# Patient Record
Sex: Male | Born: 1945
Health system: Southern US, Community
[De-identification: ages and names within clinical notes are randomized; demographics above are authoritative.]

## PROBLEM LIST (undated history)

## (undated) DIAGNOSIS — I63132 Cerebral infarction due to embolism of left carotid artery: Secondary | ICD-10-CM

## (undated) DIAGNOSIS — I493 Ventricular premature depolarization: Secondary | ICD-10-CM

## (undated) DIAGNOSIS — F419 Anxiety disorder, unspecified: Secondary | ICD-10-CM

## (undated) DIAGNOSIS — R918 Other nonspecific abnormal finding of lung field: Secondary | ICD-10-CM

## (undated) DIAGNOSIS — R000 Tachycardia, unspecified: Secondary | ICD-10-CM

## (undated) DIAGNOSIS — F1090 Alcohol use, unspecified, uncomplicated: Secondary | ICD-10-CM

## (undated) DIAGNOSIS — F32A Depression, unspecified: Secondary | ICD-10-CM

## (undated) DIAGNOSIS — Z789 Other specified health status: Secondary | ICD-10-CM

## (undated) DIAGNOSIS — I4719 Other supraventricular tachycardia: Secondary | ICD-10-CM

## (undated) DIAGNOSIS — I313 Pericardial effusion (noninflammatory): Secondary | ICD-10-CM

## (undated) DIAGNOSIS — I2510 Atherosclerotic heart disease of native coronary artery without angina pectoris: Secondary | ICD-10-CM

## (undated) DIAGNOSIS — F329 Major depressive disorder, single episode, unspecified: Secondary | ICD-10-CM

## (undated) DIAGNOSIS — I3139 Other pericardial effusion (noninflammatory): Secondary | ICD-10-CM

## (undated) DIAGNOSIS — I471 Supraventricular tachycardia: Secondary | ICD-10-CM

## (undated) DIAGNOSIS — Z7289 Other problems related to lifestyle: Secondary | ICD-10-CM

## (undated) DIAGNOSIS — E785 Hyperlipidemia, unspecified: Secondary | ICD-10-CM

## (undated) DIAGNOSIS — I472 Ventricular tachycardia: Secondary | ICD-10-CM

## (undated) DIAGNOSIS — I351 Nonrheumatic aortic (valve) insufficiency: Secondary | ICD-10-CM

## (undated) DIAGNOSIS — I639 Cerebral infarction, unspecified: Secondary | ICD-10-CM

## (undated) DIAGNOSIS — N289 Disorder of kidney and ureter, unspecified: Secondary | ICD-10-CM

## (undated) DIAGNOSIS — E039 Hypothyroidism, unspecified: Secondary | ICD-10-CM

## (undated) DIAGNOSIS — I10 Essential (primary) hypertension: Secondary | ICD-10-CM

## (undated) HISTORY — DX: Cerebral infarction due to embolism of left carotid artery: I63.132

## (undated) HISTORY — DX: Atherosclerotic heart disease of native coronary artery without angina pectoris: I25.10

## (undated) HISTORY — DX: Hyperlipidemia, unspecified: E78.5

## (undated) HISTORY — DX: Essential (primary) hypertension: I10

## (undated) HISTORY — DX: Anxiety disorder, unspecified: F41.9

## (undated) HISTORY — PX: EYE SURGERY: SHX253

## (undated) HISTORY — DX: Cerebral infarction, unspecified: I63.9

## (undated) HISTORY — DX: Hypothyroidism, unspecified: E03.9

## (undated) HISTORY — DX: Other nonspecific abnormal finding of lung field: R91.8

---

## 1967-08-24 HISTORY — PX: MANDIBLE SURGERY: SHX707

## 1977-08-23 HISTORY — PX: BREAST LUMPECTOMY: SHX2

## 1978-08-23 HISTORY — PX: CIRCUMCISION: SUR203

## 1994-08-23 HISTORY — PX: BACK SURGERY: SHX140

## 2004-01-22 ENCOUNTER — Encounter: Admission: RE | Admit: 2004-01-22 | Discharge: 2004-01-22 | Payer: Self-pay | Admitting: Internal Medicine

## 2004-02-06 ENCOUNTER — Encounter: Admission: RE | Admit: 2004-02-06 | Discharge: 2004-02-06 | Payer: Self-pay | Admitting: Internal Medicine

## 2004-02-21 ENCOUNTER — Encounter: Admission: RE | Admit: 2004-02-21 | Discharge: 2004-02-21 | Payer: Self-pay | Admitting: Internal Medicine

## 2004-06-12 ENCOUNTER — Ambulatory Visit (HOSPITAL_COMMUNITY): Admission: RE | Admit: 2004-06-12 | Discharge: 2004-06-12 | Payer: Self-pay | Admitting: Cardiology

## 2005-12-21 ENCOUNTER — Ambulatory Visit: Payer: Self-pay | Admitting: Internal Medicine

## 2006-01-03 ENCOUNTER — Ambulatory Visit (HOSPITAL_COMMUNITY): Admission: RE | Admit: 2006-01-03 | Discharge: 2006-01-03 | Payer: Self-pay | Admitting: Internal Medicine

## 2006-01-06 ENCOUNTER — Ambulatory Visit: Payer: Self-pay | Admitting: Internal Medicine

## 2006-07-01 ENCOUNTER — Ambulatory Visit (HOSPITAL_COMMUNITY): Admission: RE | Admit: 2006-07-01 | Discharge: 2006-07-01 | Payer: Self-pay | Admitting: Urology

## 2006-07-04 ENCOUNTER — Ambulatory Visit: Payer: Self-pay | Admitting: Internal Medicine

## 2007-01-31 ENCOUNTER — Ambulatory Visit (HOSPITAL_COMMUNITY): Admission: RE | Admit: 2007-01-31 | Discharge: 2007-01-31 | Payer: Self-pay | Admitting: Internal Medicine

## 2007-11-22 ENCOUNTER — Ambulatory Visit: Payer: Self-pay | Admitting: Cardiology

## 2007-11-23 ENCOUNTER — Inpatient Hospital Stay (HOSPITAL_COMMUNITY): Admission: AD | Admit: 2007-11-23 | Discharge: 2007-11-25 | Payer: Self-pay | Admitting: Internal Medicine

## 2007-11-23 ENCOUNTER — Ambulatory Visit: Payer: Self-pay | Admitting: Cardiology

## 2007-12-18 ENCOUNTER — Ambulatory Visit: Payer: Self-pay | Admitting: Cardiology

## 2008-08-30 ENCOUNTER — Inpatient Hospital Stay (HOSPITAL_COMMUNITY): Admission: AD | Admit: 2008-08-30 | Discharge: 2008-08-31 | Payer: Self-pay | Admitting: Cardiology

## 2008-08-30 ENCOUNTER — Ambulatory Visit: Payer: Self-pay | Admitting: Cardiovascular Disease

## 2008-08-30 ENCOUNTER — Ambulatory Visit: Payer: Self-pay | Admitting: Cardiology

## 2008-09-16 ENCOUNTER — Ambulatory Visit: Payer: Self-pay | Admitting: Cardiology

## 2009-01-09 ENCOUNTER — Encounter: Payer: Self-pay | Admitting: Cardiology

## 2009-05-22 DIAGNOSIS — R079 Chest pain, unspecified: Secondary | ICD-10-CM

## 2009-05-22 DIAGNOSIS — E785 Hyperlipidemia, unspecified: Secondary | ICD-10-CM | POA: Insufficient documentation

## 2009-05-22 DIAGNOSIS — E782 Mixed hyperlipidemia: Secondary | ICD-10-CM | POA: Insufficient documentation

## 2009-05-22 DIAGNOSIS — I2510 Atherosclerotic heart disease of native coronary artery without angina pectoris: Secondary | ICD-10-CM | POA: Insufficient documentation

## 2009-05-22 DIAGNOSIS — I10 Essential (primary) hypertension: Secondary | ICD-10-CM | POA: Insufficient documentation

## 2010-12-07 LAB — BASIC METABOLIC PANEL
Calcium: 8.8 mg/dL (ref 8.4–10.5)
Chloride: 106 mEq/L (ref 96–112)
Creatinine, Ser: 1 mg/dL (ref 0.4–1.5)
GFR calc Af Amer: >60 mL/min (ref >60)
Glucose, Bld: 100 mg/dL — ABNORMAL HIGH (ref 70–99)
Potassium: 3.9 mEq/L (ref 3.5–5.1)

## 2010-12-07 LAB — GLUCOSE, CAPILLARY

## 2010-12-07 LAB — AMYLASE: Amylase: 32 U/L (ref 27–131)

## 2010-12-07 LAB — CBC
HCT: 41.7 % (ref 39.0–52.0)
Hemoglobin: 13.8 g/dL (ref 13.0–17.0)
MCHC: 33 g/dL (ref 30.0–36.0)
RDW: 13.6 % (ref 11.5–15.5)

## 2010-12-07 LAB — LIPASE, BLOOD: Lipase: 25 U/L (ref 11–59)

## 2010-12-07 LAB — LIPID PANEL: Triglycerides: 175 mg/dL — ABNORMAL HIGH (ref <150)

## 2011-01-05 NOTE — Discharge Summary (Signed)
NAMEROBBI, SCURLOCK NO.:  [id number]   MEDICAL RECORD NO.:  [id number]          PATIENT TYPE:  INP   LOCATION:  6526                         FACILITY:  [facility]   PHYSICIAN:  Jesse Sans. Wall, MD, FACCDATE OF BIRTH:  10/25/1945   DATE OF ADMISSION:  08/30/2008  DATE OF DISCHARGE:  08/31/2008                               DISCHARGE SUMMARY   PROCEDURES:  1. Cardiac catheterization.  2. Coronary arteriogram.  3. Left ventriculogram.  4. Aortic arch angiogram.   PRIMARY FINAL DISCHARGE DIAGNOSIS:  Chest pain, no critical coronary  artery disease at catheterization.   SECONDARY DIAGNOSES:  1. Preserved left ventricular function with an ejection fraction of      55%.  2. History of lumbosacral degenerative joint disease with chronic back      pain.  3. Hypothyroidism.  4. Diabetes.  5. Seasonal allergies.  6. Hypertension.  7. History of pulmonary nodule in the left lung as well as a small      aneurysmal area, followup chest CT recommended.  8. Kidneys stones.  9. Hyperlipidemia.  10.Back surgery in 1996.  11.Abdominal evaluation with ultrasound, HIDA scan and EGD which were      all normal.  12.History of colonoscopy in 2005 with a tortuous colon.  13.Remote history of pancreatitis secondary to EtOH use.  14.Remote history of tobacco use.   TIME OF DISCHARGE:  33 minutes.   HOSPITAL COURSE:  Mr. Gomer is a 65 year old male with a history of  nonobstructive coronary artery disease who had chest pain and was  referred for further evaluation.  He has multiple cardiac risk factors  and his symptoms were concerning, so he was sent to the hospital for  catheterization.   The cardiac catheterization showed 30% proximal and mid LAD with a 20%  mid and distal RCA stenosis.  His EF was normal.  He was admitted  overnight for observation.   On August 31, 2008, his vital signs were well controlled.  Because a  small aneurysm was seen in the past, it was  recommended that once his  renal function had been rechecked post cath, he get a CT angiogram of  the chest to reassess this if it has not been recently done.  He also  has a pulmonary nodule that would need followup.  Dr. Daleen Squibb evaluated Mr.  Speros on August 31, 2008, and felt him stable for discharge with  close outpatient followup.   DISCHARGE INSTRUCTIONS:  His activity level is to be increased gradually  with no lifting for a week and no driving for 2 days.  He is to call our  office for problems with the cath site.  He is to follow up with Dr.  Andee Lineman in our office, we will call him.  He is to follow up with Dr.  Sherril Croon as needed.   DISCHARGE MEDICATIONS:  1. Metoprolol 50 mg b.i.d.  2. Lipitor 80 mg a day.  3. Synthroid 50 mcg daily.  4. Neurontin 100 mg t.i.d.  5. Hydrocodone 10 mg p.r.n.  6. Glyburide 2.5 mg daily.  7. Aspirin 81 mg daily.  Theodore Demark, PA-C      Jesse Sans. Daleen Squibb, MD, [facility]  Electronically Signed    RB/MEDQ  D:  08/31/2008  T:  08/31/2008  Job:  [id number]   cc:   Doreen Beam, MD

## 2011-01-05 NOTE — Assessment & Plan Note (Signed)
[facility]                          [facility]ICE NOTE   AYRON, FILLINGER                  MRN:          [id number]  DATE:09/16/2008                            DOB:          1946-08-23    HISTORY OF PRESENT ILLNESS:  The patient is a very pleasant 65 year old  male with a history of hypertension.  The patient underwent a recent  cardiac catheterization by Dr. Excell Seltzer.  She presented with classic chest  pain.  Catheterization was actually within normal limits.  Ejection  fraction was 55%.  He has no mitral regurgitation.  Aortic arch  angiography demonstrated a bovine arch, but no visualization of an  aortic arch aneurysm by arch angiography.  There was no evidence of  dilatation or dissection.  Next, the patient states that he has no  substernal chest pain, no shortness of breath, orthopnea, or PND.  No  palpitations or syncope.   MEDICATIONS:  1. Synthroid 50 mcg p.o. daily.  2. Lopressor 50 mg p.o. b.i.d.  3. Hydrocodone 10/650 t.i.d.  4. Zocor 40 mg p.o. nightly.  5. Aspirin 325 daily.  6. Protonix 40 mg p.o. daily.  7. Glyburide 2.5 mg p.o. daily.  8. Neurontin 100 mg p.o. t.i.d.  9. Hydrochlorothiazide 25 mg.   PHYSICAL EXAMINATION:  VITAL SIGNS:  Blood pressure is 140/86, heart  rate 71, weight 189 pounds.  NECK:  Normal carotid upstroke and no carotid bruits.  LUNGS:  Breath sounds bilaterally.  HEART:  Regular rate and rhythm.  Normal S1, S2.  No murmur, rubs, or  gallops.  ABDOMEN:  Soft, nontender.  No rebound or guarding.  Good bowel sounds.  EXTREMITIES:  No cyanosis, clubbing, or edema.  NEUROLOGIC:  The patient is alert, oriented, and grossly nonfocal.   PROBLEM LIST:  1. Chest pain with nonobstructive coronary artery disease.  2. Normal ejection fraction.  3. Hypertension, poorly controlled.  4. Hyperlipidemia.  5. Remote tobacco use.  6. Type 2 diabetes mellitus.  7. Bovine aortic arch.  8. Resolved  pulmonary nodules.  9. Hypothyroidism.  Followed by his primary care physician.   PLAN:  1. I have reassured the patient that he does not need yearly CT scans      for an aneurysm as it turns out that he has a bovine arch.  2. Blood pressure elevation that needs to be controlled, and I will      add hydrochlorothiazide 25 mg to his medical regimen.     Learta Codding, MD,FACC  Electronically Signed    GED/MedQ  DD: 09/16/2008  DT: 09/17/2008  Job #: [id number]   cc:   Doreen Beam, MD

## 2011-01-05 NOTE — Assessment & Plan Note (Signed)
[facility]                          [facility]ICE NOTE   YEHYA, BRENDLE                  MRN:          [id number]  DATE:12/18/2007                            DOB:          08/17/1946    PRIMARY CARDIOLOGIST:  Dr. Lewayne Bunting.   REASON FOR VISIT:  Post hospital follow-up.   The patient presents to the clinic after being transferred directly from  [facility] to [facility], for further evaluation of chest pain.  He presented with prior history of nonobstructive CAD by previous  catheterization in 2005, by Dr. Randa Evens.  He did not return for  follow-up.   His current cardiac catheterization, performed by Dr. Simona Huh,  again showed nonobstructive CAD with approximately 50% mid-LAD  associated with an element of bridging, but no clear obstruction.  LV  function was preserved (EF 60%), with no regional wall motion  abnormality.  There was no significant valvular disease.   Medications were adjusted with the addition of simvastatin 40 daily.  The patient was on also advised to follow-up with Dr. Sherril Croon, with respect  to an elevated TSH of 10.2.   Prior to transfer to [facility], the patient did have a CT scan of the chest  here, giving documented history of right lung nodules as well as an  ulcerated aortic arch.  The CT scan suggested a stable, small (10 x 30  mm) aneurysm of the aortic arch with thrombus.  There was also interval  resolution of small, bilateral lung nodules.   The patient reports no complications of the right groin incision site.   Mr. Grell has had occasional chest pain since his recent  hospitalization, but these are clearly nonexertional and atypical.  He  denies any exertional component.  He also has a reported history of  gastritis, and suggested himself that he had had a previous ulcer.  He was placed on Protonix at [facility].   CURRENT MEDICATIONS:  1. Synthroid 0.05 mg daily.  2.  Lopressor 50 b.i.d.  3. Hydrocodone 10/650 t.i.d.  4. Zocor 40 daily.  5. Full-dose aspirin.  6. Protonix.  7. Fexofenadine 180 daily.  8. Glyburide and metformin.   PHYSICAL EXAMINATION:  Blood pressure 154/87, pulse 74, regular weight  189.  GENERAL:  A 65 year old male, sitting upright, no distress.  HEENT:  Normocephalic, atraumatic.  NECK:  Palpable carotid pulses without bruits; no JVD.  LUNGS:  Clear to auscultation in all fields.  HEART:  Regular rate and rhythm (S1/S); no murmurs, rubs, or gallops.  ABDOMEN:  Soft, nontender, intact bowel sounds.  EXTREMITIES:  Right groin stable with no ecchymosis, hematoma, or bruit  on auscultation; palpable distal pulses without edema.  NEUROLOGICAL:  No focal deficits.   IMPRESSION:  1. Atypical chest pain.      a.     Nonobstructive coronary artery disease by recent cardiac       catheterization.      b.     50% mid LAD, with element of bridging.      c.     Ejection fraction 60%; no wall motion abnormalities.  2. Hypertension, uncontrolled.  3. Mixed dyslipidemia.      a.     Simvastatin recently added.  4. Remote tobacco.  5. Type 2 diabetes mellitus.  6. History of gastritis.  7. Chronic, small aortic arch aneurysm with thrombus.      a.     Stable by current CT scan.  8. History of pulmonary nodules.      a.     Resolved by current CT scan.  9. Hyperthyroidism.      a.     Elevated TSH level.   PLAN:  1. Adjust current medication regimen as follows:  downtitrate beta-      blocker, as this is not a good first line agent for treatment of      hypertension.  The patient has no history of myocardial infarction,      no evidence of LV dysfunction, or history of congestive heart      failure.  However, given the finding of the small, chronic aortic      arch aneurysm, I will keep Mr. Craine on a minimal dose of 25 mg      b.i.d..  Better suited for his uncontrolled blood pressure, we will      initiate treatment with  hydrochlorothiazide 12.5 daily and      lisinopril 5 daily for aggressive management.  These can then be      uptitrated as blood pressure allows.  2. Follow-up BMET in 1 week.  We will check a followup TSH at that      time, as well.  If this remains elevated, then we will refer this      result to Dr. Sherril Croon, for continued management of hypothyroidism.  3. Down titrate aspirin to 81 mg daily, indefinitely.  4. Surveillance fasting lipid profile in 2 months.  5. Schedule return clinic follow-up with myself and Dr. Andee Lineman in 2      months.      Gene Serpe, PA-C  Electronically Signed      Learta Codding, MD,FACC  Electronically Signed   GS/MedQ  DD: 12/18/2007  DT: 12/18/2007  Job #: [id number]   cc:   Doreen Beam, MD

## 2011-01-05 NOTE — Discharge Summary (Signed)
[name], [name] NO.:  [id number]   MEDICAL RECORD NO.:  [id number]          PATIENT TYPE:  INP   LOCATION:  4707                         FACILITY:  [facility]   PHYSICIAN:  Madolyn Frieze. Jens Som, MD, FACCDATE OF BIRTH:  Oct 26, 1945   DATE OF ADMISSION:  11/23/2007  DATE OF DISCHARGE:                         DISCHARGE SUMMARY - REFERRING   DISCHARGE DIAGNOSES:  1. Chest discomfort of uncertain etiology.  2. Nonobstructive coronary artery disease with preserved left      ventricular function.  3. Small aortic arch aneurysm.  4. Hyperlipidemia.  5. History of hypothyroidism with persistent elevated thyroid-      stimulating hormone.  6. History of diabetes.  7. Hypertension.   ADMITTING PHYSICIAN:  Dr. Learta Codding  on November 23, 2007.   DISCHARGING PHYSICIAN:  Dr. Jens Som on November 25, 2007.   BRIEF HISTORY:  [name] is a 65 year old male patient who was seen  in consultation on November 23, 2007, at [facility] by Dr. Andee Lineman.  The patient described a 2-3 week history of exertional chest discomfort  associated with shortness of breath.  He had not experienced any rest  symptoms until the day prior to admission while sitting in a car,  associated with radiation to his left shoulder, near syncope.  He went  to his primary care physician's office who then had him admitted to [facility].   PAST MEDICAL HISTORY:  1. Notable for nonobstructive coronary artery disease by cath in      October 2005.  2. Diabetes.  3. Hypertension.  4. Hypothyroidism.  5. Kidney disorder as a child.  6. Alcoholic pancreatitis.  7. DJD.  8. Bilateral lower lobe pulmonary nodules.  9. SULFA ALLERGY.  10.Remote tobacco.   LABORATORY DATA:  At [facility], admission weight was 85.021 kg.  Admission sodium was 136, potassium 4.1, BUN 10, creatinine 0.9, glucose  138.  Prior to discharge on the November 25, 2007, hemoglobin and  hematocrit was 14.3 and 42.0, normal  indices, platelets 168, WBCs 7.5,  sodium 140, potassium 36, BUN 11, creatinine 0.83, glucose 82.  Fasting  lipids showed a total cholesterol of 206, triglycerides 220, HDL 20, 80,  LDL 134.  TSH was elevated at 10.226.  November 24, 2007, chest CT with  contrast showed small 10 x 30 aneurysm in the aortic arch, unchanged.  Given the prior study, interval resolution of small bilateral lung  nodules.   HOSPITAL COURSE:  The patient was transferred to [facility] and  accepted by Dr. Diona Browner.  CT scan was performed and did not show any  acute abnormalities.  Thus, he underwent cardiac catheterization also by  Dr. Diona Browner on November 24, 2007.  Dr. Diona Browner noted some LAD bridging,  but otherwise nonobstructive coronary artery disease, EF was 60%.  Dr.  Diona Browner anticipated medical treatment with early follow up with Dr.  Andee Lineman.  By November 25, 2007, the patient was ambulating without  difficulty.  Dr. Jens Som after review agreed with Dr. Ival Bible  findings.  Dr. Jens Som did add Zocor given his hyperlipidemia,  nonobstructive coronary artery disease, and recommended follow-up with  his primary care doctor to adjust his levothyroxine given his elevated  TSH.   PROCEDURES PERFORMED:  Cardiac catheterization on November 24, 2007, by Dr.  Diona Browner.   DISPOSITION:  The patient is discharged home, asked to maintain low-  sodium, heart-healthy ADA diet.  Wound care and activities are per  supplemental discharge sheet.  He received a new prescription for Zocor  40 mg nightly and nitroglycerin 0.4 as needed.  He was asked to resume  his glyburide/metformin on Sunday morning.  He was given permission to  continue aspirin 325 mg daily, Lopressor 50 mg b.i.d., levothyroxine 50  mcg daily, Protonix 40 mg daily, fexofenadine 180 mg daily and  hydrocodone/APAP 10/650 t.i.d. as needed.  He will need blood work in 6-  8 weeks in regards to FLP and LFTs since Zocor was initiated.  He was  asked to bring  all medications to all appointments.  Dr. Margarita Mail office  will call him with follow-up appointment.  He was asked to make an  appointment with Dr. Sherril Croon to follow up on thyroid.   DISCHARGE TIME:  40 minutes.      Ronald Rued, PA-C      Madolyn Frieze Jens Som, MD, [facility]  Electronically Signed    EW/MEDQ  D:  11/25/2007  T:  11/25/2007  Job:  [id number]   cc:   Doreen Beam, MD  Learta Codding, MD,FACC

## 2011-01-05 NOTE — Cardiovascular Report (Signed)
[name], CAN NO.:  [id number]   MEDICAL RECORD NO.:  [id number]          PATIENT TYPE:  INP   LOCATION:  4707                         FACILITY:  [facility]   PHYSICIAN:  Jonelle Sidle, MD DATE OF BIRTH:  1946-05-27   DATE OF PROCEDURE:  DATE OF DISCHARGE:                            CARDIAC CATHETERIZATION   PRIMARY CARDIOLOGIST:  Dr. Andee Lineman.   PRIMARY CARE PHYSICIAN:  Dr. Doreen Beam   INDICATIONS:  Mr. Menees is a 65 year old gentleman with a history of  previously documented nonobstructive coronary atherosclerosis at cardiac  catheterization in October 2005.  Additional problems include  hyperlipidemia, type 2 diabetes mellitus, hypertension, history of  regular alcohol use with previous alcoholic pancreatitis and also  previously documented pulmonary nodules as well as atherosclerotic  plaque within an aortic arch.  He presented to [facility] with chest discomfort and ruled out for myocardial infarction.  Given concerns about possible unstable angina, the patient was referred  for diagnostic cardiac catheterization.  Prior to this, he had a CT scan  of the chest that demonstrated a stable appearing small aneurysmal area  within the aortic arch measuring 10 x 20 mm.  It was also noted that he  had resolution of the previously documented pulmonary nodules.  The  potential risks and benefits of the procedure were explained to him in  advance and informed consent was obtained.   PROCEDURE PERFORMED:  Left heart catheterization, selective coronary  geography, left ventriculography.   ACCESS AND EQUIPMENT:  The area about the right femoral artery was  anesthetized with 1% lidocaine, and a 5-French sheath was placed in the  right femoral artery via modified Seldinger technique.  Standard  preformed 4-French JL-4 and JR-4 catheters were used for selective  coronary geography and an angled pigtail catheter was used for left  heart  catheterization and left ventriculography.  Total of 85 mL  Omnipaque were used.  The patient tolerated the procedure well without  immediate complications.   HEMODYNAMIC RESULTS:  Aorta is 149/74 mmHg.  Left ventricle is 146/21  mmHg.   ANGIOGRAPHIC FINDINGS:  1. Left main coronary artery gives rise to left anterior descending      and circumflex vessels.  Vessel size in general is medium.  No flow-      limiting stenosis is noted.  2. Left anterior descending is a small to medium caliber vessel.      There are three diagonal branches, the first tube more proximal      branches are the largest.  There are mild luminal irregularities      within the left anterior descending.  Most notable is an area of      50% focal stenosis after the takeoff of the second diagonal branch.      There also looks to be an element of bridging associated with this,      although no clear obstruction.  Distal to this area is an area of      20% stenosis.  There is also a 40% stenosis involving the second      diagonal branch proximally.  3. The circumflex vessel is medium in caliber.  There is one obtuse      marginal stenosis noted.  Minor luminal irregularities are noted.  4. The right coronary artery is a small to medium caliber vessel with      posterior descending branch and posterolateral system.  Proximally      is an area of 30% stenosis.  No flow limiting stenoses are noted.   LEFT VENTRICULOGRAM:  Left ventriculography is performed in the RAO  projection and reveals an ejection fraction of approximately 60% with no  focal wall motion abnormality.  Trace mitral regurgitation is noted.   DIAGNOSES:  1. Mild coronary atherosclerosis as outlined.  There is an area of      approximately 50% mid left anterior descending stenosis associated      with an element of bridging, although no clear obstruction is      noted.  2. Left ventricular ejection fraction of approximately 60% with no      wall  motion abnormality.  Trace mitral regurgitation and a left      ventricular end-diastolic pressure of 21 mmHg.   DISCUSSION:  At this point, would anticipate medical therapy.  The  patient's recent CT scan of the chest reveals stable findings.  At this  time, we will add a proton pump inhibitor.  He will have his arterial  sheath removed around 1:00 p.m. today as he was given Lovenox earlier  this morning.  He can ambulate later in the afternoon.  Will be hydrated  and anticipate edema in the morning.  If his creatinine is stable, he  will likely be discharged for further follow-up in the [place] office.      Jonelle Sidle, MD  Electronically Signed     SGM/MEDQ  D:  11/24/2007  T:  11/24/2007  Job:  [id number]   cc:   Learta Codding, MD,FACC  Doreen Beam, MD

## 2011-01-05 NOTE — Cardiovascular Report (Signed)
NAMENERO, SAWATZKY NO.:  [id number]   MEDICAL RECORD NO.:  [id number]          PATIENT TYPE:  INP   LOCATION:  6526                         FACILITY:  [facility]   PHYSICIAN:  Veverly Fells. Excell Seltzer, MD  DATE OF BIRTH:  20-Oct-1945   DATE OF PROCEDURE:  08/29/2008  DATE OF DISCHARGE:                            CARDIAC CATHETERIZATION   PROCEDURES:  1. Left heart catheterization.  2. Selective coronary angiography.  3. Left ventricular angiography.  4. Aortic arch angiography.   INDICATIONS:  Mr. Mata is a 65 year old gentleman with  nonobstructive CAD.  He presented with classic substernal chest pain  suggestive of unstable angina.  He also has a small aneurysm involving  the aortic arch that has been stable over a serial exams from June 2008  to April 2009.  The patient was referred for cardiac catheterization and  aortic arch angiography for further evaluation of his chest pain.   Risks and indications of procedure were reviewed with the patient and  informed consent was obtained.  The right groin was prepped, draped, and  anesthetized with 1% lidocaine.  Using modified Seldinger technique, a 6-  French sheath was placed in the right femoral artery.  Standard 6-French  Judkins catheters were used for coronary angiography, left  ventriculography, and aortic arch angiography.  All catheter exchanges  were performed over the guidewire.  The patient tolerated the procedure  well.  There were no immediate complications.   FINDINGS:  Aortic pressure 117/65 with a mean of 87, left ventricular  pressure 117/6.   CORONARY ANGIOGRAPHY:  Left mainstem:  There is no significant stenosis  noted.  The left main bifurcates into the LAD and left circumflex.   LAD:  The LAD is a moderate-sized vessel that courses down and wraps  around the LV apex.  The vessel supplies 2 diagonal branches, both of  moderate size.  The LAD has minor lumen irregularities involving the  proximal and mid vessel producing 30% stenoses.  There are no high-grade  stenoses throughout the LAD or its diagonal branches.   Left circumflex:  The left circumflex courses down and supplies a  moderate-sized OM1 as well as a moderate OM2.  There is also an atrial  branch noted.  The left circumflex has no significant stenosis  throughout its course.   Right coronary artery:  The RCA is dominant.  There is mild diffuse  plaquing noted with 20% proximal stenosis and diffuse 20% distal  stenosis.  There is a PDA and posterolateral branch present.   Left ventriculography shows low normal LV function with an LVEF  estimated at 55%.  There is no mitral regurgitation.   Aortic arch angiography demonstrates a bovine arch.  There is no  visualization of an aortic arch aneurysm by arch angiography.  There is  no evidence of dilatation or dissection.   ASSESSMENT:  1. Nonobstructive coronary artery disease.  2. Normal left ventricular function.   PLAN:  The patient likely has noncardiac chest pain.  We will observe  overnight and likely discharge home tomorrow.      Veverly Fells. Excell Seltzer, MD  Electronically  Signed     MDC/MEDQ  D:  08/30/2008  T:  08/31/2008  Job:  [id number]   cc:   Learta Codding, MD,FACC  Doreen Beam, MD

## 2011-01-08 NOTE — Cardiovascular Report (Signed)
NAME:  [name], [name] NO.:  [id number]   MEDICAL RECORD NO.:  [id number]          PATIENT TYPE:  OUT   LOCATION:  CATH                         FACILITY:  [facility]   PHYSICIAN:  Salvadore Farber, M.D. LHCDATE OF BIRTH:  1946-04-09   DATE OF PROCEDURE:  06/12/2004  DATE OF DISCHARGE:                              CARDIAC CATHETERIZATION   PROCEDURE:  Left heart catheterization, left ventriculography, coronary  angiography, AngioSeal closure of the right common femoral arteriotomy site.   INDICATIONS:  [name] is a 65 year old gentleman who presents with  chest discomfort.  He was seen by Dr. Sherril Croon and Dr. Andee Lineman and referred for  diagnostic angiography.  He ruled out for myocardial infarction by serial  enzymes and electrocardiograms.   PROCEDURAL TECHNIQUE:  Informed consent was obtained.  Under 1% lidocaine  local anesthesia a 6-French sheath was placed in the right common femoral  artery using the modified Seldinger technique.  Diagnostic angiography and  ventriculography were performed using JL4, JR4, and pigtail catheters.  After sheath injection confirmed entry of the sheath in the right common  femoral artery below the inguinal ligament, the arteriotomy was closed using  a 6-French AngioSeal device.  Complete hemostasis was obtained.  The patient  was transferred to the holding room in stable condition.   COMPLICATIONS:  None.   FINDINGS:  1.  LV 144/7/10.  EF 65% without regional wall motion abnormality.  2.  No aortic stenosis or mitral regurgitation.  3.  Left main:  Angiographically normal.  4.  LAD:  Moderate sized vessel giving rise to three relatively small      diagonal branches.  The mid LAD has a 30% stenosis at the takeoff of the      third diagonal.  This diagonal has a 40% ostial stenosis.  5.  Circumflex:  Moderate sized vessel giving rise to two obtuse marginals.      It is angiographically normal.  6.  RCA:  Moderate sized, dominant  vessel.  It has a 40% stenosis      proximally.  No other significant disease.   IMPRESSION/RECOMMENDATIONS:  [name] has minimal coronary disease.  His chest pain is likely secondary to gastritis.  Will initiate proton-pump  inhibitor and discharge him to home.   Given the demonstrated coronary disease, will need aggressive attention to  secondary prevention.  Will continue him on aspirin.  I have asked him to  follow up in the near future with Dr. Sherril Croon for assessment of his lipid  profile.  Would treat to goal LDL less than 70.       WED/MEDQ  D:  06/12/2004  T:  06/12/2004  Job:  [id number]   cc:   Doreen Beam  [address]  [place]  [place] [address]  Fax: [phone]   Learta Codding, M.D. [facility]

## 2011-05-18 LAB — CBC
HCT: 42
Hemoglobin: 14.3
RBC: 4.55
RDW: 13.9
WBC: 7.5

## 2011-05-18 LAB — LIPID PANEL
Cholesterol: 206 — ABNORMAL HIGH
HDL: 28 — ABNORMAL LOW
Total CHOL/HDL Ratio: 7.4
VLDL: 44 — ABNORMAL HIGH

## 2011-05-18 LAB — BASIC METABOLIC PANEL
BUN: 11
BUN: 9
Calcium: 8.6
Creatinine, Ser: 0.83
GFR calc Af Amer: >60
Glucose, Bld: 86
Potassium: 3.6
Potassium: 3.9
Potassium: 4.1
Sodium: 136
Sodium: 140
Sodium: 141

## 2013-08-23 HISTORY — PX: COLONOSCOPY: SHX174

## 2014-10-18 HISTORY — PX: COLONOSCOPY: SHX174

## 2016-01-26 ENCOUNTER — Emergency Department (HOSPITAL_COMMUNITY)
Admission: EM | Admit: 2016-01-26 | Discharge: 2016-01-26 | Disposition: A | Discharge status: Home / Self Care | Payer: Medicare Other | Attending: Emergency Medicine | Admitting: Emergency Medicine

## 2016-01-26 ENCOUNTER — Emergency Department (HOSPITAL_COMMUNITY): Payer: Medicare Other

## 2016-01-26 ENCOUNTER — Encounter (HOSPITAL_COMMUNITY): Payer: Self-pay | Admitting: *Deleted

## 2016-01-26 DIAGNOSIS — M545 Low back pain: Secondary | ICD-10-CM | POA: Diagnosis present

## 2016-01-26 DIAGNOSIS — Z87891 Personal history of nicotine dependence: Secondary | ICD-10-CM | POA: Diagnosis not present

## 2016-01-26 DIAGNOSIS — E119 Type 2 diabetes mellitus without complications: Secondary | ICD-10-CM | POA: Insufficient documentation

## 2016-01-26 DIAGNOSIS — M5441 Lumbago with sciatica, right side: Secondary | ICD-10-CM

## 2016-01-26 DIAGNOSIS — I2510 Atherosclerotic heart disease of native coronary artery without angina pectoris: Secondary | ICD-10-CM | POA: Insufficient documentation

## 2016-01-26 DIAGNOSIS — I10 Essential (primary) hypertension: Secondary | ICD-10-CM | POA: Diagnosis not present

## 2016-01-26 DIAGNOSIS — E039 Hypothyroidism, unspecified: Secondary | ICD-10-CM | POA: Diagnosis not present

## 2016-01-26 DIAGNOSIS — M5440 Lumbago with sciatica, unspecified side: Secondary | ICD-10-CM | POA: Diagnosis not present

## 2016-01-26 DIAGNOSIS — M5442 Lumbago with sciatica, left side: Secondary | ICD-10-CM

## 2016-01-26 DIAGNOSIS — E785 Hyperlipidemia, unspecified: Secondary | ICD-10-CM | POA: Diagnosis not present

## 2016-01-26 DIAGNOSIS — Z7984 Long term (current) use of oral hypoglycemic drugs: Secondary | ICD-10-CM | POA: Insufficient documentation

## 2016-01-26 MED ORDER — OXYCODONE-ACETAMINOPHEN 5-325 MG PO TABS
1.0000 | ORAL_TABLET | Freq: Once | ORAL | Status: AC
  Administered 2016-01-26: 1 via ORAL
  Filled 2016-01-26: qty 1

## 2016-01-26 MED ORDER — FENTANYL CITRATE (PF) 100 MCG/2ML IJ SOLN
50.0000 ug | Freq: Once | INTRAMUSCULAR | Status: AC
Start: 2016-01-26 — End: 2016-01-26
  Administered 2016-01-26: 50 ug via INTRAMUSCULAR
  Filled 2016-01-26: qty 2

## 2016-01-26 MED ORDER — KETOROLAC TROMETHAMINE 60 MG/2ML IM SOLN
60.0000 mg | Freq: Once | INTRAMUSCULAR | Status: AC
  Administered 2016-01-26: 60 mg via INTRAMUSCULAR
  Filled 2016-01-26: qty 2

## 2016-01-26 MED ORDER — OXYCODONE-ACETAMINOPHEN 5-325 MG PO TABS: 2.0000 | ORAL_TABLET | Freq: Once | ORAL | Status: DC

## 2016-01-26 NOTE — Discharge Instructions (Signed)

## 2016-01-26 NOTE — ED Provider Notes (Signed)
History  By signing my name below, I, Earmon Phoenix, attest that this documentation has been prepared under the direction and in the presence of Langston Masker, [place]. Electronically Signed: Earmon Phoenix, ED Scribe. 01/26/2016. 7:43 PM.  Chief Complaint  Patient presents with  . Back Pain   The history is provided by the patient and medical records. No language interpreter was used.    HPI Comments:  [name] is a 70 y.o. male with PMHx of chronic back pain who presents to the Emergency Department complaining of lower back pain that began last night secondary to falling out of his bed last night. Pt states his chronic back pain flared up about one week ago and has now increased since the fall. He states the pain has started radiating down BLE today. He has been applying a heating pad and taking his regularly prescribe Vicodin (last dose about 3 hours ago) and Xanax with minimal relief of the pain. He denies modifying factors. He denies head trauma, LOC, numbness, tingling or weakness of the lower extremities, bowel or bladder incontinence, saddle anesthesia. He reports surgical h/o back surgery approximately 20 years ago by Dr. Jeral Fruit. He reports PMHx of GERD, DM, HTN, HLD, CAD and hypothyroidism. Pt states he went to a pain management clinic in the past but did not like the provider. He is ambulatory without assistance. PCP is Dr. Sherril Croon. He reports allergies to Morphine and Sulfa.  Past Medical History  Diagnosis Date  . Hypertension   . Hyperlipidemia   . Coronary artery disease   . Chest pain   . Diabetes mellitus   . Pulmonary nodules     resolved  . Hypothyroidism    Past Surgical History  Procedure Laterality Date  . Back surgery     No family history on file. Social History  Substance Use Topics  . Smoking status: Former Games developer  . Smokeless tobacco: None  . Alcohol Use: No    Review of Systems  Musculoskeletal: Positive for back pain.  All other systems  reviewed and are negative.   Allergies  Sulfonamide derivatives  Home Medications   Prior to Admission medications   Medication Sig Start Date End Date Taking? Authorizing Provider  ALPRAZolam Prudy Feeler) 1 MG tablet Take 1 mg by mouth 2 (two) times daily as needed. 01/18/16  Yes Historical Provider, MD  atorvastatin (LIPITOR) 20 MG tablet Take 20 mg by mouth daily.  01/16/16  Yes Historical Provider, MD  hydrochlorothiazide (HYDRODIURIL) 25 MG tablet Take 25 mg by mouth daily.   Yes Historical Provider, MD  HYDROcodone-acetaminophen (NORCO) 7.5-325 MG tablet Take 1 tablet by mouth every 6 (six) hours as needed for moderate pain.   Yes Historical Provider, MD  levothyroxine (SYNTHROID, LEVOTHROID) 88 MCG tablet Take 88 mcg by mouth daily before breakfast.  01/16/16  Yes Historical Provider, MD  metFORMIN (GLUCOPHAGE) 500 MG tablet Take 500 mg by mouth 2 (two) times daily with a meal.  01/16/16  Yes Historical Provider, MD  metoprolol (LOPRESSOR) 50 MG tablet Take 50 mg by mouth 2 (two) times daily.  01/16/16  Yes Historical Provider, MD  omeprazole (PRILOSEC) 20 MG capsule Take 20 mg by mouth daily.  01/16/16  Yes Historical Provider, MD   Triage Vitals: BP 160/72 mmHg  Pulse 74  Temp(Src) 98 F (36.7 C) (Oral)  Resp 16  Ht  (1.753 m)  Wt 187 lb (84.823 kg)  BMI 27.60 kg/m2  SpO2 100% Physical Exam  Constitutional: He is  oriented to person, place, and time. He appears well-developed and well-nourished.  HENT:  Head: Normocephalic and atraumatic.  Eyes: EOM are normal.  Neck: Normal range of motion.  Cardiovascular: Normal rate.   Pulmonary/Chest: Effort normal.  Musculoskeletal: He exhibits tenderness.  Decreased ROM. Pain with movement.  Neurological: He is alert and oriented to person, place, and time.  Skin: Skin is warm and dry.  Psychiatric: He has a normal mood and affect. His behavior is normal.  Nursing note and vitals reviewed.   ED Course  Procedures (including  critical care time) DIAGNOSTIC STUDIES: Oxygen Saturation is 100% on RA, normal by my interpretation.   COORDINATION OF CARE: 7:41 PM- Will X-Ray L-spine. Pt verbalizes understanding and agrees to plan.  Medications  fentaNYL (SUBLIMAZE) injection 50 mcg (not administered)  ketorolac (TORADOL) injection 60 mg (not administered)    Labs Review Labs Reviewed - No data to display  Imaging Review Dg Lumbar Spine Complete  01/26/2016  CLINICAL DATA:  Low back pain after falling out of bed last night. Previous back surgery 25 years ago. EXAM: LUMBAR SPINE - COMPLETE 4+ VIEW COMPARISON:  Abdomen and pelvis CT dated 10/02/2014. FINDINGS: Transitional thoracolumbar vertebra followed by 5 non-rib-bearing lumbar vertebrae. Mild to moderate anterior and lateral spur formation at multiple levels. No fractures, pars defects or subluxations. Prominent stool. IMPRESSION: 1. No fracture or subluxation. 2. Mild to moderate degenerative changes. 3. Prominent stool. Electronically Signed   By: Beckie Salts M.D.   On: 01/26/2016 20:20   I have personally reviewed and evaluated these images and lab results as part of my medical decision-making.   EKG Interpretation None      MDM Pt given torodol and fentynl.  Pt reports minimal relief.  Pt reports his pain seems to be increasing since his doctor decreased his pain medication dosage.  Pt reports he has been taking more of his medication due to pain. Pt advised to  Call his Md tommorow to discuss pain management.     Final diagnoses:  Bilateral low back pain with sciatica, sciatica laterality unspecified   An After Visit Summary was printed and given to the patient. Meds ordered this encounter  Medications  . levothyroxine (SYNTHROID, LEVOTHROID) 88 MCG tablet    Sig: Take 88 mcg by mouth daily before breakfast.   . omeprazole (PRILOSEC) 20 MG capsule    Sig: Take 20 mg by mouth daily.   . metFORMIN (GLUCOPHAGE) 500 MG tablet    Sig: Take 500 mg by  mouth 2 (two) times daily with a meal.   . ALPRAZolam (XANAX) 1 MG tablet    Sig: Take 1 mg by mouth 2 (two) times daily as needed.    Refill:  2  . atorvastatin (LIPITOR) 20 MG tablet    Sig: Take 20 mg by mouth daily.   . metoprolol (LOPRESSOR) 50 MG tablet    Sig: Take 50 mg by mouth 2 (two) times daily.   Marland Kitchen HYDROcodone-acetaminophen (NORCO) 7.5-325 MG tablet    Sig: Take 1 tablet by mouth every 6 (six) hours as needed for moderate pain.  . fentaNYL (SUBLIMAZE) injection 50 mcg    Sig:   . ketorolac (TORADOL) injection 60 mg    Sig:   . DISCONTD: oxyCODONE-acetaminophen (PERCOCET/ROXICET) 5-325 MG per tablet 2 tablet    Sig:   . oxyCODONE-acetaminophen (PERCOCET/ROXICET) 5-325 MG per tablet 1 tablet    Sig:    I personally performed the services in this documentation, which was  scribed in my presence.  The recorded information has been reviewed and considered.   Barnet PallKaren SofiaPAC.    Elson AreasLeslie K Nichael Ehly, PA-C 01/26/16 [id number]  Lonia SkinnerLeslie K Laguna HeightsSofia, PA-C 01/26/16 [id number]  Bethann BerkshireJoseph Zammit, MD 01/29/16 [phone]

## 2016-01-26 NOTE — ED Notes (Addendum)
Pt c/o lower back pain after falling out of the bed last night. Denies LOC upon fall. Pt reports chronic back pain since back surgery 25 years ago with flare ups that happen intermittently. Pt was having a flare up approximately 1 week ago and after the fall last night the pain increased.

## 2016-03-24 ENCOUNTER — Ambulatory Visit (INDEPENDENT_AMBULATORY_CARE_PROVIDER_SITE_OTHER): Payer: Medicare Other | Admitting: Neurology

## 2016-03-24 ENCOUNTER — Encounter: Payer: Self-pay | Admitting: Neurology

## 2016-03-24 ENCOUNTER — Telehealth: Payer: Self-pay

## 2016-03-24 VITALS — BP 143/70 | HR 68 | Ht 69.0 in | Wt 178.0 lb

## 2016-03-24 DIAGNOSIS — R519 Headache, unspecified: Secondary | ICD-10-CM

## 2016-03-24 DIAGNOSIS — R51 Headache: Secondary | ICD-10-CM

## 2016-03-24 DIAGNOSIS — R269 Unspecified abnormalities of gait and mobility: Secondary | ICD-10-CM | POA: Diagnosis not present

## 2016-03-24 DIAGNOSIS — I63132 Cerebral infarction due to embolism of left carotid artery: Secondary | ICD-10-CM | POA: Insufficient documentation

## 2016-03-24 HISTORY — DX: Cerebral infarction due to embolism of left carotid artery: I63.132

## 2016-03-24 NOTE — Telephone Encounter (Signed)
Called Dr. Sherril Croon' office, agreed to fax requested results to F # [phone] attn: Victorino Dike.

## 2016-03-24 NOTE — Progress Notes (Addendum)
Reason for visit: Stroke  Referring physician: Dr. Arline Asp is a 70 y.o. male  History of present illness:  [name] is a 70 year old right-handed white male with a history of dyslipidemia, coronary artery disease, diabetes, and hypertension. The patient began having significant problems with low back pain in June 2017. Sometime around 02/21/2016 he began having difficulty with balance, he was falling frequently, with 4 or 5 episodes of falling. The patient also developed a headache that is primarily around the right eye, but is also in the bifrontal regions. Headaches are very unusual for him. Since the beginning of July, he has had a reduction in appetite, he has lost about 20 pounds he claims. The patient was seen and evaluated by his primary doctor on 02/27/2016, MRI of the brain was set up and revealed evidence of a subacute left caudate head stroke. The patient has since undergone a carotid doppler study and a 2-D echocardiogram. I do not have the results of the studies, but the patient indicates that they did not show any clear etiology of the stroke event. The patient had been placed on aspirin after the stroke was identified. The patient feels fatigued, weak all over. He is now walking with a walker. He has been slightly more forgetful. The patient does report some occasional tingling in the hands when he sleeps at night, usually not a problem during the day. The headaches continue on. He is sent to this office for an evaluation.  Past Medical History:  Diagnosis Date  . Anxiety   . Chest pain   . Coronary artery disease   . Diabetes mellitus   . Hyperlipidemia   . Hypertension   . Hypothyroidism   . Pulmonary nodules    resolved  . Stroke (cerebrum) [facility])     Past Surgical History:  Procedure Laterality Date  . BACK SURGERY  1996  . BREAST LUMPECTOMY Left 1979  . CIRCUMCISION  1980  . MANDIBLE SURGERY Right 1969    No family history on  file.  Social history:  reports that he has quit smoking. He does not have any smokeless tobacco history on file. He reports that he does not drink alcohol or use drugs.  Medications:  Prior to Admission medications   Medication Sig Start Date End Date Taking? Authorizing Provider  ALPRAZolam Prudy Feeler) 1 MG tablet Take 1 mg by mouth 2 (two) times daily as needed. 01/18/16   Historical Provider, MD  atorvastatin (LIPITOR) 20 MG tablet Take 20 mg by mouth daily.  01/16/16   Historical Provider, MD  hydrochlorothiazide (HYDRODIURIL) 25 MG tablet Take 25 mg by mouth daily.    Historical Provider, MD  HYDROcodone-acetaminophen (NORCO) 7.5-325 MG tablet Take 1 tablet by mouth every 6 (six) hours as needed for moderate pain.    Historical Provider, MD  levothyroxine (SYNTHROID, LEVOTHROID) 88 MCG tablet Take 88 mcg by mouth daily before breakfast.  01/16/16   Historical Provider, MD  metFORMIN (GLUCOPHAGE) 500 MG tablet Take 500 mg by mouth 2 (two) times daily with a meal.  01/16/16   Historical Provider, MD  metoprolol (LOPRESSOR) 50 MG tablet Take 50 mg by mouth 2 (two) times daily.  01/16/16   Historical Provider, MD  omeprazole (PRILOSEC) 20 MG capsule Take 20 mg by mouth daily.  01/16/16   Historical Provider, MD      Allergies  Allergen Reactions  . Sulfonamide Derivatives     REACTION: Rash    ROS:  Out of a complete 14 system review of symptoms, the patient complains only of the following symptoms, and all other reviewed systems are negative.  Fatigue Hearing loss, ringing in the ears, dizziness Moles Blurred vision Joint pain, achy muscles, allergies Confusion, headache, numbness, weakness, dizziness Anxiety, decreased energy, change in appetite Insomnia, snoring, restless legs  Blood pressure (!) 143/70, pulse 68, height  (1.753 m), weight 178 lb (80.7 kg).   Blood pressure, right arm, standing is 148 systolic.  Physical Exam  General: The patient is alert and cooperative  at the time of the examination.  Eyes: Pupils are equal, round, and reactive to light. Discs are flat bilaterally.  Neck: The neck is supple, no carotid bruits are noted.  Respiratory: The respiratory examination is clear.  Cardiovascular: The cardiovascular examination reveals a regular rate and rhythm, no obvious murmurs or rubs are noted.  Skin: Extremities are without significant edema.  Neurologic Exam  Mental status: The patient is alert and oriented x 3 at the time of the examination. The patient has apparent normal recent and remote memory, with an apparently normal attention span and concentration ability.  Cranial nerves: Facial symmetry is present. There is good sensation of the face to pinprick and soft touch bilaterally. The strength of the facial muscles and the muscles to head turning and shoulder shrug are normal bilaterally. Speech is well enunciated, no aphasia or dysarthria is noted. Extraocular movements are full. Visual fields are full. The tongue is midline, and the patient has symmetric elevation of the soft palate. No obvious hearing deficits are noted.  Motor: The motor testing reveals 5 over 5 strength of all 4 extremities. Good symmetric motor tone is noted throughout.  Sensory: Sensory testing is intact to pinprick, soft touch, vibration sensation, and position sense on all 4 extremities, with the exception of some decreased pinprick sensation on the right arm and right leg relative to the left, some decrease in vibration sensation on the right foot relative to the left. No evidence of extinction is noted.  Coordination: Cerebellar testing reveals good finger-nose-finger and heel-to-shin bilaterally.  Gait and station: Gait is minimally wide-based. Tandem gait is minimally unsteady. Romberg is negative. No drift is seen.  Reflexes: Deep tendon reflexes are symmetric, but are depressed bilaterally. Toes are downgoing bilaterally.   Assessment/Plan:  1. Left  caudate head stroke  2. Gait disturbance  3. Headache  The patient reports onset of balance problems sometime around 02/21/2016. The patient has also developed headaches, a gait disturbance, but he also reports some weight loss. The patient will be sent for blood work to include a sedimentation rate and a C-reactive protein to exclude temporal arteritis. The patient will continue the aspirin, he is not to do any strenuous activity for about 6 weeks. I will get him set up for physical therapy for gait training. The patient will need follow-up through his primary care physician. The LDL cholesterol should be less than 70 in this gentleman. I will try to get the reports of the carotid Doppler study and 2-D echocardiogram that were done recently.  Addendum: I have received the reports of the 2-D echocardiogram and carotid Doppler studies. The 2-D echocardiogram shows mild left ventricular hypertrophy, ejection fraction of 55-60%. There is mild aortic insufficiency. The carotid Doppler study shows less than 50% stenosis of the carotid arteries.  Marlan Palau MD 03/24/2016 1:46 PM  [facility] [address] [place], [place] [address]  Phone [phone] Fax [phone]

## 2016-03-24 NOTE — Patient Instructions (Addendum)
Stroke Prevention Some medical conditions and behaviors are associated with an increased chance of having a stroke. You may prevent a stroke by making healthy choices and managing medical conditions. HOW CAN I REDUCE MY RISK OF HAVING A STROKE?   Stay physically active. Get at least 30 minutes of activity on most or all days.  Do not smoke. It may also be helpful to avoid exposure to secondhand smoke.  Limit alcohol use. Moderate alcohol use is considered to be:  No more than 2 drinks per day for men.  No more than 1 drink per day for nonpregnant women.  Eat healthy foods. This involves:  Eating 5 or more servings of fruits and vegetables a day.  Making dietary changes that address high blood pressure (hypertension), high cholesterol, diabetes, or obesity.  Manage your cholesterol levels.  Making food choices that are high in fiber and low in saturated fat, trans fat, and cholesterol may control cholesterol levels.  Take any prescribed medicines to control cholesterol as directed by your health care provider.  Manage your diabetes.  Controlling your carbohydrate and sugar intake is recommended to manage diabetes.  Take any prescribed medicines to control diabetes as directed by your health care provider.  Control your hypertension.  Making food choices that are low in salt (sodium), saturated fat, trans fat, and cholesterol is recommended to manage hypertension.  Ask your health care provider if you need treatment to lower your blood pressure. Take any prescribed medicines to control hypertension as directed by your health care provider.  If you are 18-39 years of age, have your blood pressure checked every 3-5 years. If you are 40 years of age or older, have your blood pressure checked every year.  Maintain a healthy weight.  Reducing calorie intake and making food choices that are low in sodium, saturated fat, trans fat, and cholesterol are recommended to manage  weight.  Stop drug abuse.  Avoid taking birth control pills.  Talk to your health care provider about the risks of taking birth control pills if you are over 35 years old, smoke, get migraines, or have ever had a blood clot.  Get evaluated for sleep disorders (sleep apnea).  Talk to your health care provider about getting a sleep evaluation if you snore a lot or have excessive sleepiness.  Take medicines only as directed by your health care provider.  For some people, aspirin or blood thinners (anticoagulants) are helpful in reducing the risk of forming abnormal blood clots that can lead to stroke. If you have the irregular heart rhythm of atrial fibrillation, you should be on a blood thinner unless there is a good reason you cannot take them.  Understand all your medicine instructions.  Make sure that other conditions (such as anemia or atherosclerosis) are addressed. SEEK IMMEDIATE MEDICAL CARE IF:   You have sudden weakness or numbness of the face, arm, or leg, especially on one side of the body.  Your face or eyelid droops to one side.  You have sudden confusion.  You have trouble speaking (aphasia) or understanding.  You have sudden trouble seeing in one or both eyes.  You have sudden trouble walking.  You have dizziness.  You have a loss of balance or coordination.  You have a sudden, severe headache with no known cause.  You have new chest pain or an irregular heartbeat. Any of these symptoms may represent a serious problem that is an emergency. Do not wait to see if the symptoms will   go away. Get medical help at once. Call your local emergency services (911 in [place] Do not drive yourself to the hospital.   This information is not intended to replace advice given to you by your health care provider. Make sure you discuss any questions you have with your health care provider.   Document Released: 09/16/2004 Document Revised: 08/30/2014 Document Reviewed:  02/09/2013 Elsevier Interactive Patient Education 2016 Elsevier Inc.  

## 2016-03-24 NOTE — Telephone Encounter (Signed)
-----   Message from York Spaniel, MD sent at 03/24/2016  2:28 PM EDT ----- Please call the office of Dr. Sherril Croon and get the report of the carotid Doppler study and 2-D echocardiogram were done faxed to our office. Thank you.

## 2016-03-25 ENCOUNTER — Telehealth: Payer: Self-pay | Admitting: Neurology

## 2016-03-25 LAB — SEDIMENTATION RATE: SED RATE: 2 mm/h (ref 0–30)

## 2016-03-25 LAB — C-REACTIVE PROTEIN: CRP: 18.9 mg/L — ABNORMAL HIGH (ref 0.0–4.9)

## 2016-03-25 NOTE — Telephone Encounter (Signed)
I called the patient. The sedimentation rate was low, 2. The C-reactive protein was slightly elevated, overall, I do not think that the patient is at risk for temporal arteritis.

## 2017-01-12 ENCOUNTER — Telehealth: Payer: Self-pay | Admitting: Cardiology

## 2017-01-12 NOTE — Telephone Encounter (Signed)
Request for surgical clearance:  1. What type of surgery is being performed?Hammer toe Fusion Left 4th toe and Hammer toe Correction of left 5th toe  2. When is this surgery scheduled? 01/13/17  3. Are there any medications that need to be held prior to surgery and how long?Not Sure   4. Name of physician performing surgery?Dr.Kevin Henry   5. What is your office phone and fax number? Office no#[phone], Fax no# [phone]

## 2017-01-12 NOTE — Telephone Encounter (Signed)
ERROR

## 2017-01-12 NOTE — Telephone Encounter (Signed)
Called patient as per records, has not seen Dr. Herbie BaltimoreHarding since [facility] - [facility]. Patient states he knows nothing about surgery. Advised patient I will contact [facility]

## 2017-04-20 ENCOUNTER — Encounter: Payer: Self-pay | Admitting: Orthopedic Surgery

## 2017-04-20 ENCOUNTER — Ambulatory Visit (INDEPENDENT_AMBULATORY_CARE_PROVIDER_SITE_OTHER): Payer: Medicare Other | Admitting: Orthopedic Surgery

## 2017-04-20 ENCOUNTER — Ambulatory Visit (INDEPENDENT_AMBULATORY_CARE_PROVIDER_SITE_OTHER): Payer: Medicare Other

## 2017-04-20 VITALS — BP 113/66 | HR 57 | Ht 69.0 in | Wt 188.0 lb

## 2017-04-20 DIAGNOSIS — M25551 Pain in right hip: Secondary | ICD-10-CM

## 2017-04-20 DIAGNOSIS — M25552 Pain in left hip: Secondary | ICD-10-CM

## 2017-04-20 DIAGNOSIS — M48061 Spinal stenosis, lumbar region without neurogenic claudication: Secondary | ICD-10-CM

## 2017-04-20 NOTE — Progress Notes (Signed)
Patient ID: [name], male   DOB: [birth year], 71 y.o.   MRN: [id number]  Chief Complaint  Patient presents with  . Hip Pain    Bilateral hip pain, referred by Dr. Gerilyn Pilgrim.    HPI [name] is a 71 y.o. male.  Presents for evaluation of bilateral hip pain  Chief Complaint  Patient presents with  . Hip Pain    Bilateral hip pain, referred by Dr. Gerilyn Pilgrim.   71 year old male had a lumbar discectomy over 20 years ago presents with bilateral hip pain catching and locking especially of the right hip.  On occasion he will have a sharp pain that runs down the back of his right leg X 8 WEEKS and most of his pain is located over the left and right buttock  He denies numbness or tingling in either leg       Review of Systems Review of Systems  Respiratory: Negative for shortness of breath.   Cardiovascular: Negative for chest pain.  Gastrointestinal: Negative.   Genitourinary: Negative.     Past Medical History:  Diagnosis Date  . Anxiety   . Chest pain   . Coronary artery disease   . Diabetes mellitus   . Hyperlipidemia   . Hypertension   . Hypothyroidism   . Pulmonary nodules    resolved  . Stroke (cerebrum) (HCC)   . Stroke due to embolism of left carotid artery (HCC) 03/24/2016   Left caudate head stroke    Past Surgical History:  Procedure Laterality Date  . BACK SURGERY  1996  . BREAST LUMPECTOMY Left 1979  . CIRCUMCISION  1980  . MANDIBLE SURGERY Right 1969    Family History  Problem Relation Age of Onset  . Lung cancer Mother   . Diabetes Father   . Bladder Cancer Father   . Colon cancer Brother   . Stroke Maternal Grandmother   . Cancer Maternal Grandfather   . Stroke Paternal Grandmother   . Stroke Paternal Grandfather    was reviewed  Social History Social History  Substance Use Topics  . Smoking status: Former Games developer  . Smokeless tobacco: Never Used  . Alcohol use No    Allergies  Allergen Reactions  . Morphine  Itching and Rash  . Sulfonamide Derivatives     REACTION: Rash    Current Outpatient Prescriptions  Medication Sig Dispense Refill  . ACCU-CHEK AVIVA PLUS test strip     . Alcohol Swabs (ALCOHOL PREP) 70 % PADS     . ALPRAZolam (XANAX) 1 MG tablet Take 1 mg by mouth 2 (two) times daily as needed.  2  . aspirin 81 MG tablet Take 81 mg by mouth daily.    Marland Kitchen atorvastatin (LIPITOR) 20 MG tablet Take 20 mg by mouth daily.     . Diclofenac Sodium 3 % GEL     . glimepiride (AMARYL) 2 MG tablet     . hydrochlorothiazide (HYDRODIURIL) 25 MG tablet Take 25 mg by mouth daily.    Marland Kitchen HYDROcodone-acetaminophen (NORCO) 10-325 MG tablet     . levothyroxine (SYNTHROID, LEVOTHROID) 88 MCG tablet Take 88 mcg by mouth daily before breakfast.     . metFORMIN (GLUCOPHAGE) 500 MG tablet Take 500 mg by mouth 2 (two) times daily with a meal.     . metoprolol (LOPRESSOR) 50 MG tablet Take 50 mg by mouth 2 (two) times daily.     . nitroGLYCERIN (NITROSTAT) 0.4 MG SL tablet     . omeprazole (  PRILOSEC) 20 MG capsule Take 20 mg by mouth daily.      No current facility-administered medications for this visit.        Physical Exam Physical Exam  Constitutional: He is oriented to person, place, and time. He appears well-developed and well-nourished.  Vital signs have been reviewed and are stable. Gen. appearance the patient is well-developed and well-nourished with normal grooming and hygiene.   Musculoskeletal:  GAIT IS SUPPORTED BY A CANE  Neurological: He is alert and oriented to person, place, and time.  Skin: Skin is warm and dry. No erythema.  Psychiatric: He has a normal mood and affect.  Vitals reviewed.   Back Exam   Tenderness  The patient is experiencing tenderness in the lumbar, sacroiliac tenderness.  Range of Motion  Flexion:                  Abnormal Extension:                Abnormal Lateral Bend Left:    Abnormal Lateral Bend Right:  Abnormal Rotation Right:         Rotation Left:            SLR   Right: Negative Left:    Negative  Muscle Strength  Hip Abductors: 5/5 Hip Adductors:  Quadriceps:    Hamstrings:       Reflexes  Patellar:  2/4 Achilles:  1/4      Neurologic examination  Reflexes were 2+ and equal  Sensation was normal in both feet and legs  Babinski's tests were down going  Straight leg raise testing was normal bilaterally  The vascular examination revealed normal dorsalis pedis pulses in both feet and both feet were warm with good capillary refill   Data Reviewed He hasn't x-ray of his hips from 2016 they were normal  Assessment  Encounter Diagnoses  Name Primary?  . Pain of both hip joints Yes  . Spinal stenosis, lumbar region, without neurogenic claudication       Plan  I found no pathology and the patient's hip joints. He is referred back to his primary care doctor for referral to a specialist regarding his back   Fuller CanadaStanley Harrison, MD 04/20/2017 2:39 PM

## 2017-12-01 DIAGNOSIS — M545 Low back pain: Secondary | ICD-10-CM | POA: Diagnosis not present

## 2017-12-15 DIAGNOSIS — E782 Mixed hyperlipidemia: Secondary | ICD-10-CM | POA: Diagnosis not present

## 2017-12-15 DIAGNOSIS — M545 Low back pain: Secondary | ICD-10-CM | POA: Diagnosis not present

## 2017-12-15 DIAGNOSIS — E039 Hypothyroidism, unspecified: Secondary | ICD-10-CM | POA: Diagnosis not present

## 2017-12-15 DIAGNOSIS — I10 Essential (primary) hypertension: Secondary | ICD-10-CM | POA: Diagnosis not present

## 2017-12-15 DIAGNOSIS — E119 Type 2 diabetes mellitus without complications: Secondary | ICD-10-CM | POA: Diagnosis not present

## 2017-12-15 DIAGNOSIS — Z125 Encounter for screening for malignant neoplasm of prostate: Secondary | ICD-10-CM | POA: Diagnosis not present

## 2017-12-19 DIAGNOSIS — E782 Mixed hyperlipidemia: Secondary | ICD-10-CM | POA: Diagnosis not present

## 2017-12-19 DIAGNOSIS — G9009 Other idiopathic peripheral autonomic neuropathy: Secondary | ICD-10-CM | POA: Diagnosis not present

## 2017-12-19 DIAGNOSIS — Z0000 Encounter for general adult medical examination without abnormal findings: Secondary | ICD-10-CM | POA: Diagnosis not present

## 2017-12-19 DIAGNOSIS — E119 Type 2 diabetes mellitus without complications: Secondary | ICD-10-CM | POA: Diagnosis not present

## 2017-12-19 DIAGNOSIS — I10 Essential (primary) hypertension: Secondary | ICD-10-CM | POA: Diagnosis not present

## 2017-12-28 ENCOUNTER — Encounter (HOSPITAL_COMMUNITY): Payer: Self-pay | Admitting: Emergency Medicine

## 2017-12-28 ENCOUNTER — Inpatient Hospital Stay (HOSPITAL_COMMUNITY)
Admission: EM | Admit: 2017-12-28 | Discharge: 2017-12-30 | DRG: 552 | Disposition: A | Discharge status: Home / Self Care | Payer: Medicare Other | Attending: Family Medicine | Admitting: Family Medicine

## 2017-12-28 ENCOUNTER — Other Ambulatory Visit: Payer: Self-pay

## 2017-12-28 ENCOUNTER — Emergency Department (HOSPITAL_COMMUNITY): Payer: Medicare Other

## 2017-12-28 DIAGNOSIS — E785 Hyperlipidemia, unspecified: Secondary | ICD-10-CM | POA: Diagnosis not present

## 2017-12-28 DIAGNOSIS — Z7982 Long term (current) use of aspirin: Secondary | ICD-10-CM | POA: Diagnosis not present

## 2017-12-28 DIAGNOSIS — W19XXXA Unspecified fall, initial encounter: Secondary | ICD-10-CM

## 2017-12-28 DIAGNOSIS — I252 Old myocardial infarction: Secondary | ICD-10-CM | POA: Diagnosis not present

## 2017-12-28 DIAGNOSIS — M79605 Pain in left leg: Secondary | ICD-10-CM

## 2017-12-28 DIAGNOSIS — R296 Repeated falls: Secondary | ICD-10-CM | POA: Diagnosis present

## 2017-12-28 DIAGNOSIS — Z79890 Hormone replacement therapy (postmenopausal): Secondary | ICD-10-CM | POA: Diagnosis not present

## 2017-12-28 DIAGNOSIS — Z833 Family history of diabetes mellitus: Secondary | ICD-10-CM

## 2017-12-28 DIAGNOSIS — M549 Dorsalgia, unspecified: Secondary | ICD-10-CM | POA: Diagnosis not present

## 2017-12-28 DIAGNOSIS — R918 Other nonspecific abnormal finding of lung field: Secondary | ICD-10-CM | POA: Diagnosis present

## 2017-12-28 DIAGNOSIS — E782 Mixed hyperlipidemia: Secondary | ICD-10-CM | POA: Diagnosis present

## 2017-12-28 DIAGNOSIS — E039 Hypothyroidism, unspecified: Secondary | ICD-10-CM | POA: Diagnosis not present

## 2017-12-28 DIAGNOSIS — Z79899 Other long term (current) drug therapy: Secondary | ICD-10-CM

## 2017-12-28 DIAGNOSIS — M545 Low back pain: Secondary | ICD-10-CM | POA: Diagnosis present

## 2017-12-28 DIAGNOSIS — E119 Type 2 diabetes mellitus without complications: Secondary | ICD-10-CM | POA: Diagnosis not present

## 2017-12-28 DIAGNOSIS — Z800 Family history of malignant neoplasm of digestive organs: Secondary | ICD-10-CM

## 2017-12-28 DIAGNOSIS — M4807 Spinal stenosis, lumbosacral region: Secondary | ICD-10-CM | POA: Diagnosis present

## 2017-12-28 DIAGNOSIS — Z885 Allergy status to narcotic agent status: Secondary | ICD-10-CM

## 2017-12-28 DIAGNOSIS — R9431 Abnormal electrocardiogram [ECG] [EKG]: Secondary | ICD-10-CM | POA: Diagnosis not present

## 2017-12-28 DIAGNOSIS — R079 Chest pain, unspecified: Secondary | ICD-10-CM | POA: Diagnosis not present

## 2017-12-28 DIAGNOSIS — Z882 Allergy status to sulfonamides status: Secondary | ICD-10-CM | POA: Diagnosis not present

## 2017-12-28 DIAGNOSIS — M48061 Spinal stenosis, lumbar region without neurogenic claudication: Principal | ICD-10-CM | POA: Diagnosis present

## 2017-12-28 DIAGNOSIS — Z801 Family history of malignant neoplasm of trachea, bronchus and lung: Secondary | ICD-10-CM

## 2017-12-28 DIAGNOSIS — Z8673 Personal history of transient ischemic attack (TIA), and cerebral infarction without residual deficits: Secondary | ICD-10-CM | POA: Diagnosis not present

## 2017-12-28 DIAGNOSIS — Z7984 Long term (current) use of oral hypoglycemic drugs: Secondary | ICD-10-CM | POA: Diagnosis not present

## 2017-12-28 DIAGNOSIS — M25551 Pain in right hip: Secondary | ICD-10-CM | POA: Diagnosis not present

## 2017-12-28 DIAGNOSIS — Z87891 Personal history of nicotine dependence: Secondary | ICD-10-CM

## 2017-12-28 DIAGNOSIS — M79604 Pain in right leg: Secondary | ICD-10-CM | POA: Diagnosis not present

## 2017-12-28 DIAGNOSIS — I10 Essential (primary) hypertension: Secondary | ICD-10-CM | POA: Diagnosis not present

## 2017-12-28 DIAGNOSIS — G8929 Other chronic pain: Secondary | ICD-10-CM | POA: Diagnosis not present

## 2017-12-28 DIAGNOSIS — I2510 Atherosclerotic heart disease of native coronary artery without angina pectoris: Secondary | ICD-10-CM | POA: Diagnosis not present

## 2017-12-28 DIAGNOSIS — Z823 Family history of stroke: Secondary | ICD-10-CM

## 2017-12-28 DIAGNOSIS — M25552 Pain in left hip: Secondary | ICD-10-CM | POA: Diagnosis not present

## 2017-12-28 DIAGNOSIS — Z8052 Family history of malignant neoplasm of bladder: Secondary | ICD-10-CM

## 2017-12-28 DIAGNOSIS — R0789 Other chest pain: Secondary | ICD-10-CM | POA: Diagnosis present

## 2017-12-28 LAB — TROPONIN I: Troponin I: <0.03 ng/mL (ref <0.03)

## 2017-12-28 LAB — CBC
HCT: 40 % (ref 39.0–52.0)
Hemoglobin: 13 g/dL (ref 13.0–17.0)
MCH: 29.7 pg (ref 26.0–34.0)
MCHC: 32.5 g/dL (ref 30.0–36.0)
MCV: 91.3 fL (ref 78.0–100.0)
PLATELETS: 158 10*3/uL (ref 150–400)
RBC: 4.38 MIL/uL (ref 4.22–5.81)
RDW: 13.7 % (ref 11.5–15.5)
WBC: 5.7 10*3/uL (ref 4.0–10.5)

## 2017-12-28 LAB — HEMOGLOBIN A1C
Hgb A1c MFr Bld: 6.2 % — ABNORMAL HIGH (ref 4.8–5.6)
Mean Plasma Glucose: 131.24 mg/dL

## 2017-12-28 LAB — BASIC METABOLIC PANEL
Anion gap: 7 (ref 5–15)
BUN: 13 mg/dL (ref 6–20)
CALCIUM: 9.5 mg/dL (ref 8.9–10.3)
CO2: 32 mmol/L (ref 22–32)
CREATININE: 1.29 mg/dL — AB (ref 0.61–1.24)
Chloride: 103 mmol/L (ref 101–111)
GFR calc non Af Amer: 55 mL/min — ABNORMAL LOW (ref >60)
Glucose, Bld: 80 mg/dL (ref 65–99)
Potassium: 3.8 mmol/L (ref 3.5–5.1)
Sodium: 142 mmol/L (ref 135–145)

## 2017-12-28 LAB — GLUCOSE, CAPILLARY: GLUCOSE-CAPILLARY: 190 mg/dL — AB (ref 65–99)

## 2017-12-28 MED ORDER — ASPIRIN EC 81 MG PO TBEC
81.0000 mg | DELAYED_RELEASE_TABLET | Freq: Every day | ORAL | Status: DC
  Administered 2017-12-28 – 2017-12-30 (×3): 81 mg via ORAL
  Filled 2017-12-28 (×3): qty 1

## 2017-12-28 MED ORDER — HYDROCODONE-ACETAMINOPHEN 5-325 MG PO TABS
2.0000 | ORAL_TABLET | Freq: Once | ORAL | Status: AC
  Administered 2017-12-28: 2 via ORAL
  Filled 2017-12-28: qty 2

## 2017-12-28 MED ORDER — ATORVASTATIN CALCIUM 20 MG PO TABS
20.0000 mg | ORAL_TABLET | Freq: Every day | ORAL | Status: DC
  Administered 2017-12-28 – 2017-12-29 (×2): 20 mg via ORAL
  Filled 2017-12-28 (×2): qty 1

## 2017-12-28 MED ORDER — HYDROCODONE-ACETAMINOPHEN 5-325 MG PO TABS
1.0000 | ORAL_TABLET | Freq: Four times a day (QID) | ORAL | Status: DC | PRN
  Administered 2017-12-28 – 2017-12-29 (×2): 2 via ORAL
  Filled 2017-12-28 (×2): qty 2

## 2017-12-28 MED ORDER — ACETAMINOPHEN 325 MG PO TABS
650.0000 mg | ORAL_TABLET | ORAL | Status: DC | PRN
  Administered 2017-12-30: 650 mg via ORAL
  Filled 2017-12-28: qty 2

## 2017-12-28 MED ORDER — INSULIN ASPART 100 UNIT/ML ~~LOC~~ SOLN
0.0000 [IU] | Freq: Three times a day (TID) | SUBCUTANEOUS | Status: DC
  Administered 2017-12-29 – 2017-12-30 (×2): 1 [IU] via SUBCUTANEOUS

## 2017-12-28 MED ORDER — PANTOPRAZOLE SODIUM 40 MG PO TBEC
40.0000 mg | DELAYED_RELEASE_TABLET | Freq: Every day | ORAL | Status: DC
  Administered 2017-12-28 – 2017-12-30 (×3): 40 mg via ORAL
  Filled 2017-12-28 (×3): qty 1

## 2017-12-28 MED ORDER — METOPROLOL TARTRATE 50 MG PO TABS
50.0000 mg | ORAL_TABLET | Freq: Two times a day (BID) | ORAL | Status: DC
  Administered 2017-12-29 – 2017-12-30 (×3): 50 mg via ORAL
  Filled 2017-12-28 (×4): qty 1

## 2017-12-28 MED ORDER — ONDANSETRON HCL 4 MG/2ML IJ SOLN
4.0000 mg | Freq: Four times a day (QID) | INTRAMUSCULAR | Status: DC | PRN
  Administered 2017-12-29 – 2017-12-30 (×2): 4 mg via INTRAVENOUS
  Filled 2017-12-28 (×2): qty 2

## 2017-12-28 MED ORDER — HYDROCHLOROTHIAZIDE 25 MG PO TABS
25.0000 mg | ORAL_TABLET | Freq: Every day | ORAL | Status: DC
  Administered 2017-12-28 – 2017-12-30 (×3): 25 mg via ORAL
  Filled 2017-12-28 (×3): qty 1

## 2017-12-28 NOTE — H&P (Signed)
History and Physical    RAFI Terris ZOX:[id number] DOB: 05-01-1946 DOA: 12/28/2017  PCP: Benita Stabile, MD  Patient coming from: Home  Chief Complaint: Chest pain   HPI: [name] is a 72 y.o. male with medical history significant of coronary artery disease, hypertension, diabetes comes in with acute onset of substernal chest pain that did not radiate with associated nausea vomiting and diaphoresis that was resolved with Tums, Pepcid, 3 nitroglycerin.  He is not really sure what helped his chest pain or if it was just time.  It lasted about an hour.  He did not have any associated shortness of breath with it.  He does not have any lower extremity swelling.  He denies any fevers or cough.  Patient referred for admission for chest pain for possible underlying ACS.  He is currently chest pain-free.  Review of Systems: As per HPI otherwise 10 point review of systems negative.   Past Medical History:  Diagnosis Date  . Anxiety   . Chest pain   . Coronary artery disease   . Diabetes mellitus   . Hyperlipidemia   . Hypertension   . Hypothyroidism   . Pulmonary nodules    resolved  . Stroke (cerebrum) (HCC)   . Stroke due to embolism of left carotid artery (HCC) 03/24/2016   Left caudate head stroke    Past Surgical History:  Procedure Laterality Date  . BACK SURGERY  1996  . BREAST LUMPECTOMY Left 1979  . CIRCUMCISION  1980  . MANDIBLE SURGERY Right 1969     reports that he has quit smoking. He has never used smokeless tobacco. He reports that he does not drink alcohol or use drugs.  Allergies  Allergen Reactions  . Morphine Itching and Rash  . Sulfonamide Derivatives     REACTION: Rash    Family History  Problem Relation Age of Onset  . Lung cancer Mother   . Diabetes Father   . Bladder Cancer Father   . Colon cancer Brother   . Stroke Maternal Grandmother   . Cancer Maternal Grandfather   . Stroke Paternal Grandmother   . Stroke Paternal Grandfather      Prior to Admission medications   Medication Sig Start Date End Date Taking? Authorizing Provider  ACCU-CHEK AVIVA PLUS test strip  02/27/16   [provider]  ALPRAZolam Prudy Feeler) 1 MG tablet Take 1 mg by mouth 2 (two) times daily as needed. 01/18/16   [provider]  aspirin 81 MG tablet Take 81 mg by mouth daily.    [provider]  atorvastatin (LIPITOR) 20 MG tablet Take 20 mg by mouth daily.  01/16/16   [provider]  Diclofenac Sodium 3 % GEL  03/16/16   [provider]  glimepiride (AMARYL) 2 MG tablet  03/16/16   [provider]  hydrochlorothiazide (HYDRODIURIL) 25 MG tablet Take 25 mg by mouth daily.    [provider]  HYDROcodone-acetaminophen [facility]) 10-325 MG tablet  02/27/16   [provider]  levothyroxine (SYNTHROID, LEVOTHROID) 88 MCG tablet Take 88 mcg by mouth daily before breakfast.  01/16/16   [provider]  metFORMIN (GLUCOPHAGE) 500 MG tablet Take 500 mg by mouth 2 (two) times daily with a meal.  01/16/16   [provider]  metoprolol (LOPRESSOR) 50 MG tablet Take 50 mg by mouth 2 (two) times daily.  01/16/16   [provider]  nitroGLYCERIN (NITROSTAT) 0.4 MG SL tablet  03/08/16  [provider]  omeprazole (PRILOSEC) 20 MG capsule Take 20 mg by mouth daily.  01/16/16   [provider]    Physical Exam: Vitals:   12/28/17 1454 12/28/17 1455 12/28/17 1500 12/28/17 1630  BP: (!) 150/75  (!) 147/67 133/68  Pulse: (!) 54  (!) 53 (!) 51  Resp: Temp: 97.8 F (36.6 C)     TempSrc: Oral     SpO2: 97%  97% 96%  Weight:  81.6 kg (180 lb)    Height:   (1.753 m)      Constitutional: NAD, calm, comfortable Vitals:   12/28/17 1454 12/28/17 1455 12/28/17 1500 12/28/17 1630  BP: (!) 150/75  (!) 147/67 133/68  Pulse: (!) 54  (!) 53 (!) 51  Resp: Temp: 97.8 F (36.6 C)     TempSrc: Oral     SpO2: 97%  97% 96%  Weight:  81.6 kg  (180 lb)    Height:   (1.753 m)     Eyes: PERRL, lids and conjunctivae normal ENMT: Mucous membranes are moist. Posterior pharynx clear of any exudate or lesions.Normal dentition.  Neck: normal, supple, no masses, no thyromegaly Respiratory: clear to auscultation bilaterally, no wheezing, no crackles. Normal respiratory effort. No accessory muscle use.  Cardiovascular: Regular rate and rhythm, no murmurs / rubs / gallops. No extremity edema. 2+ pedal pulses. No carotid bruits.  Abdomen: no tenderness, no masses palpated. No hepatosplenomegaly. Bowel sounds positive.  Musculoskeletal: no clubbing / cyanosis. No joint deformity upper and lower extremities. Good ROM, no contractures. Normal muscle tone.  Skin: no rashes, lesions, ulcers. No induration Neurologic: CN 2-12 grossly intact. Sensation intact, DTR normal. Strength 5/5 in all 4.  Psychiatric: Normal judgment and insight. Alert and oriented x 3. Normal mood.    Labs on Admission: I have personally reviewed following labs and imaging studies  CBC: Recent Labs  Lab 12/28/17 1515  WBC 5.7  HGB 13.0  HCT 40.0  MCV 91.3  PLT 158   Basic Metabolic Panel: Recent Labs  Lab 12/28/17 1515  NA 142  K 3.8  CL 103  CO2 32  GLUCOSE 80  BUN 13  CREATININE 1.29*  CALCIUM 9.5   GFR: Estimated Creatinine Clearance: 53.3 mL/min (A) (by C-G formula based on SCr of 1.29 mg/dL (H)). Liver Function Tests: No results for input(s): AST, ALT, ALKPHOS, BILITOT, PROT, ALBUMIN in the last 168 hours. No results for input(s): LIPASE, AMYLASE in the last 168 hours. No results for input(s): AMMONIA in the last 168 hours. Coagulation Profile: No results for input(s): INR, PROTIME in the last 168 hours. Cardiac Enzymes: Recent Labs  Lab 12/28/17 1515  TROPONINI <0.03   BNP (last 3 results) No results for input(s): PROBNP in the last 8760 hours. HbA1C: No results for input(s): HGBA1C in the last 72 hours. CBG: No results for  input(s): GLUCAP in the last 168 hours. Lipid Profile: No results for input(s): CHOL, HDL, LDLCALC, TRIG, CHOLHDL, LDLDIRECT in the last 72 hours. Thyroid Function Tests: No results for input(s): TSH, T4TOTAL, FREET4, T3FREE, THYROIDAB in the last 72 hours. Anemia Panel: No results for input(s): VITAMINB12, FOLATE, FERRITIN, TIBC, IRON, RETICCTPCT in the last 72 hours. Urine analysis: No results found for: COLORURINE, APPEARANCEUR, LABSPEC, PHURINE, GLUCOSEU, HGBUR, BILIRUBINUR, KETONESUR, PROTEINUR, UROBILINOGEN, NITRITE, LEUKOCYTESUR Sepsis Labs: !!!!!!!!!!!!!!!!!!!!!!!!!!!!!!!!!!!!!!!!!!!! (procalcitonin:4,lacticidven:4) )No results found for this or any previous visit (from the past 240 hour(s)).   Radiological Exams  on Admission: Dg Chest 2 View  Result Date: 12/28/2017 CLINICAL DATA:  Left-sided chest pain that began today. Shortness of breath. EXAM: CHEST - 2 VIEW COMPARISON:  12/09/2014 FINDINGS: Prominent vascular structures or lung markings in the left lower chest appear chronic. Otherwise, the lungs are clear. Heart and mediastinum are within normal limits. Trachea is midline. No large pleural effusions. Bone structures are unremarkable. IMPRESSION: No active cardiopulmonary disease. Electronically Signed   By: Richarda Overlie M.D.   On: 12/28/2017 15:49    EKG: Independently reviewed.  Sinus bradycardia with a rate of 54 no acute changes   old chart reviewed  Case discussed with ED Dr.  Chest x-ray reviewed no edema or infiltrate    Assessment/Plan 72 year old male with multiple risk factors comes in with  chest pain Principal Problem:   Chest pain-serial troponin.  Abdomen telemetry.  Placed on aspirin statin beta-blocker.  Obtain cardiac echo in the morning.  Active Problems:   HLD (hyperlipidemia)-statin   Essential hypertension-continue home meds   CAD, NATIVE VESSEL-stable    DVT prophylaxis: SCDs  Code Status: Full Family Communication:  Sons Disposition Plan: Per day team Consults called: None Admission status: Observation   Demond Shallenberger A MD [facility]  If 7PM-7AM, please contact night-coverage [email or web address] Password [facility]  12/28/2017, 4:56 PM

## 2017-12-28 NOTE — ED Provider Notes (Signed)
[facility] EMERGENCY DEPARTMENT Provider Note   CSN: [id number] Arrival date & time: 12/28/17  1444     History   Chief Complaint Chief Complaint  Patient presents with  . Chest Pain    HPI [name] is a 72 y.o. male.  HPI Patient presents with chest pain.  Began around an hour ago while he was sitting in bed.  Waxed and waned somewhat but was in his left mid chest.  Reportedly diaphoretic for EMS.  No fevers.  No shortness of breath.  Around 10 years ago had heart attack.  States that it did not feel like this at that time.  No nausea or vomiting.  Had 5 nitroglycerin.  3 by him in 2 by EMS.  States pain is now relieved but did not relief with nitroglycerin.  Has not had stress test or heart catheterization since his MI 10 years ago.  Has been doing well the last few days although he states he has had some falls.  States he has been unsteady since a previous stroke and tends to fall backwards.  States he did not hit his chest with a fall. Past Medical History:  Diagnosis Date  . Anxiety   . Chest pain   . Coronary artery disease   . Diabetes mellitus   . Hyperlipidemia   . Hypertension   . Hypothyroidism   . Pulmonary nodules    resolved  . Stroke (cerebrum) (HCC)   . Stroke due to embolism of left carotid artery (HCC) 03/24/2016   Left caudate head stroke    Patient Active Problem List   Diagnosis Date Noted  . Abnormality of gait 03/24/2016  . Stroke due to embolism of left carotid artery (HCC) 03/24/2016  . HYPERLIPIDEMIA-MIXED 05/22/2009  . HYPERTENSION, UNSPECIFIED 05/22/2009  . CAD, NATIVE VESSEL 05/22/2009  . CHEST PAIN-UNSPECIFIED 05/22/2009    Past Surgical History:  Procedure Laterality Date  . BACK SURGERY  1996  . BREAST LUMPECTOMY Left 1979  . CIRCUMCISION  1980  . MANDIBLE SURGERY Right 1969        Home Medications    Prior to Admission medications   Medication Sig Start Date End Date Taking? Authorizing Provider  ACCU-CHEK AVIVA  PLUS test strip  02/27/16   [provider]  ALPRAZolam Prudy Feeler) 1 MG tablet Take 1 mg by mouth 2 (two) times daily as needed. 01/18/16   [provider]  aspirin 81 MG tablet Take 81 mg by mouth daily.    [provider]  atorvastatin (LIPITOR) 20 MG tablet Take 20 mg by mouth daily.  01/16/16   [provider]  Diclofenac Sodium 3 % GEL  03/16/16   [provider]  glimepiride (AMARYL) 2 MG tablet  03/16/16   [provider]  hydrochlorothiazide (HYDRODIURIL) 25 MG tablet Take 25 mg by mouth daily.    [provider]  HYDROcodone-acetaminophen [facility]) 10-325 MG tablet  02/27/16   [provider]  levothyroxine (SYNTHROID, LEVOTHROID) 88 MCG tablet Take 88 mcg by mouth daily before breakfast.  01/16/16   [provider]  metFORMIN (GLUCOPHAGE) 500 MG tablet Take 500 mg by mouth 2 (two) times daily with a meal.  01/16/16   [provider]  metoprolol (LOPRESSOR) 50 MG tablet Take 50 mg by mouth 2 (two) times daily.  01/16/16   [provider]  nitroGLYCERIN (NITROSTAT) 0.4 MG SL tablet  03/08/16   [provider]  omeprazole (PRILOSEC) 20 MG capsule Take 20  mg by mouth daily.  01/16/16   [provider]    Family History Family History  Problem Relation Age of Onset  . Lung cancer Mother   . Diabetes Father   . Bladder Cancer Father   . Colon cancer Brother   . Stroke Maternal Grandmother   . Cancer Maternal Grandfather   . Stroke Paternal Grandmother   . Stroke Paternal Grandfather     Social History Social History   Tobacco Use  . Smoking status: Former Games developer  . Smokeless tobacco: Never Used  Substance Use Topics  . Alcohol use: No  . Drug use: No     Allergies   Morphine and Sulfonamide derivatives   Review of Systems Review of Systems  Constitutional: Positive for diaphoresis. Negative for appetite change.  HENT: Negative for congestion and dental problem.     Eyes: Negative for visual disturbance.  Cardiovascular: Positive for chest pain.  Gastrointestinal: Negative for abdominal distention.  Genitourinary: Negative for flank pain.  Musculoskeletal: Positive for back pain.       Chronic lower back pain  Neurological: Negative for facial asymmetry, speech difficulty and light-headedness.  Psychiatric/Behavioral: Negative for confusion.     Physical Exam Updated Vital Signs BP (!) 147/67   Pulse (!) 53   Temp 97.8 F (36.6 C) (Oral)   Resp 17   Ht  (1.753 m)   Wt 81.6 kg (180 lb)   SpO2 97%   BMI 26.58 kg/m   Physical Exam  Constitutional: He appears well-developed and well-nourished.  HENT:  Head: Atraumatic.  Eyes: Pupils are equal, round, and reactive to light.  Neck: Neck supple.  Cardiovascular: Regular rhythm.  No murmur heard. Pulmonary/Chest: Effort normal.  No chest tenderness  Musculoskeletal:       Right lower leg: He exhibits no edema.       Left lower leg: He exhibits no edema.  Neurological: He is alert.  Skin: Skin is warm. Capillary refill takes less than 2 seconds.  Psychiatric: He has a normal mood and affect.     ED Treatments / Results  Labs (all labs ordered are listed, but only abnormal results are displayed) Labs Reviewed  BASIC METABOLIC PANEL - Abnormal; Notable for the following components:      Result Value   Creatinine, Ser 1.29 (*)    GFR calc non Af Amer 55 (*)    All other components within normal limits  CBC  TROPONIN I    EKG EKG Interpretation  Date/Time:  Wednesday Dec 28 2017 14:51:21 EDT Ventricular Rate:  54 PR Interval:    QRS Duration: 104 QT Interval:  430 QTC Calculation: 408 R Axis:   -31 Text Interpretation:  Sinus rhythm Prolonged PR interval Left axis deviation No old tracing to compare Confirmed by Benjiman Core [phone]) on 12/28/2017 3:00:14 PM   Radiology Dg Chest 2 View  Result Date: 12/28/2017 CLINICAL DATA:  Left-sided chest pain that began  today. Shortness of breath. EXAM: CHEST - 2 VIEW COMPARISON:  12/09/2014 FINDINGS: Prominent vascular structures or lung markings in the left lower chest appear chronic. Otherwise, the lungs are clear. Heart and mediastinum are within normal limits. Trachea is midline. No large pleural effusions. Bone structures are unremarkable. IMPRESSION: No active cardiopulmonary disease. Electronically Signed   By: Richarda Overlie M.D.   On: 12/28/2017 15:49    Procedures Procedures (including critical care time)  Medications Ordered in ED Medications - No data to display   Initial Impression /  Assessment and Plan / ED Course  I have reviewed the triage vital signs and the nursing notes.  Pertinent labs & imaging results that were available during my care of the patient were reviewed by me and considered in my medical decision making (see chart for details).     Patient with chest pain.  Mid chest.  Pain-free now.  However does have previous MI and multiple risk factors.  With the diaphoresis and the fact last an hour I feel patient would benefit from admission.  Will admit to hospitalist.  Final Clinical Impressions(s) / ED Diagnoses   Final diagnoses:  Chest pain, unspecified type    ED Discharge Orders    None       Benjiman Core, MD 12/28/17 [id number]

## 2017-12-28 NOTE — ED Triage Notes (Signed)
Patient repots onset of L sided chest pain approx 1 hour ago while laying in the bed. Diaphoresis reported by EMS on arrival to scene. Denies other symptoms.

## 2017-12-28 NOTE — ED Notes (Signed)
Report given to Cicely, RN. 

## 2017-12-29 ENCOUNTER — Other Ambulatory Visit (HOSPITAL_COMMUNITY): Payer: Self-pay | Admitting: *Deleted

## 2017-12-29 ENCOUNTER — Other Ambulatory Visit: Payer: Self-pay

## 2017-12-29 ENCOUNTER — Observation Stay (HOSPITAL_COMMUNITY): Payer: Medicare Other

## 2017-12-29 ENCOUNTER — Inpatient Hospital Stay (HOSPITAL_COMMUNITY): Payer: Medicare Other

## 2017-12-29 DIAGNOSIS — M48061 Spinal stenosis, lumbar region without neurogenic claudication: Secondary | ICD-10-CM | POA: Diagnosis not present

## 2017-12-29 DIAGNOSIS — E785 Hyperlipidemia, unspecified: Secondary | ICD-10-CM | POA: Diagnosis not present

## 2017-12-29 DIAGNOSIS — R296 Repeated falls: Secondary | ICD-10-CM | POA: Diagnosis present

## 2017-12-29 DIAGNOSIS — E039 Hypothyroidism, unspecified: Secondary | ICD-10-CM | POA: Diagnosis present

## 2017-12-29 DIAGNOSIS — I2510 Atherosclerotic heart disease of native coronary artery without angina pectoris: Secondary | ICD-10-CM

## 2017-12-29 DIAGNOSIS — E119 Type 2 diabetes mellitus without complications: Secondary | ICD-10-CM | POA: Diagnosis present

## 2017-12-29 DIAGNOSIS — Z833 Family history of diabetes mellitus: Secondary | ICD-10-CM | POA: Diagnosis not present

## 2017-12-29 DIAGNOSIS — S3993XA Unspecified injury of pelvis, initial encounter: Secondary | ICD-10-CM | POA: Diagnosis not present

## 2017-12-29 DIAGNOSIS — Z79890 Hormone replacement therapy (postmenopausal): Secondary | ICD-10-CM | POA: Diagnosis not present

## 2017-12-29 DIAGNOSIS — M4807 Spinal stenosis, lumbosacral region: Secondary | ICD-10-CM | POA: Diagnosis present

## 2017-12-29 DIAGNOSIS — I10 Essential (primary) hypertension: Secondary | ICD-10-CM | POA: Diagnosis not present

## 2017-12-29 DIAGNOSIS — M25551 Pain in right hip: Secondary | ICD-10-CM | POA: Diagnosis present

## 2017-12-29 DIAGNOSIS — R079 Chest pain, unspecified: Secondary | ICD-10-CM | POA: Diagnosis not present

## 2017-12-29 DIAGNOSIS — Z7982 Long term (current) use of aspirin: Secondary | ICD-10-CM | POA: Diagnosis not present

## 2017-12-29 DIAGNOSIS — M545 Low back pain: Secondary | ICD-10-CM | POA: Diagnosis not present

## 2017-12-29 DIAGNOSIS — M549 Dorsalgia, unspecified: Secondary | ICD-10-CM | POA: Diagnosis present

## 2017-12-29 DIAGNOSIS — R918 Other nonspecific abnormal finding of lung field: Secondary | ICD-10-CM | POA: Diagnosis present

## 2017-12-29 DIAGNOSIS — S79921A Unspecified injury of right thigh, initial encounter: Secondary | ICD-10-CM | POA: Diagnosis not present

## 2017-12-29 DIAGNOSIS — Z7984 Long term (current) use of oral hypoglycemic drugs: Secondary | ICD-10-CM | POA: Diagnosis not present

## 2017-12-29 DIAGNOSIS — I252 Old myocardial infarction: Secondary | ICD-10-CM | POA: Diagnosis not present

## 2017-12-29 DIAGNOSIS — M79605 Pain in left leg: Secondary | ICD-10-CM | POA: Diagnosis not present

## 2017-12-29 DIAGNOSIS — Z87891 Personal history of nicotine dependence: Secondary | ICD-10-CM | POA: Diagnosis not present

## 2017-12-29 DIAGNOSIS — M79604 Pain in right leg: Secondary | ICD-10-CM | POA: Diagnosis not present

## 2017-12-29 DIAGNOSIS — Z8673 Personal history of transient ischemic attack (TIA), and cerebral infarction without residual deficits: Secondary | ICD-10-CM | POA: Diagnosis not present

## 2017-12-29 DIAGNOSIS — Z882 Allergy status to sulfonamides status: Secondary | ICD-10-CM | POA: Diagnosis not present

## 2017-12-29 DIAGNOSIS — Z885 Allergy status to narcotic agent status: Secondary | ICD-10-CM | POA: Diagnosis not present

## 2017-12-29 DIAGNOSIS — R0789 Other chest pain: Secondary | ICD-10-CM | POA: Diagnosis present

## 2017-12-29 DIAGNOSIS — M25552 Pain in left hip: Secondary | ICD-10-CM

## 2017-12-29 DIAGNOSIS — R9431 Abnormal electrocardiogram [ECG] [EKG]: Secondary | ICD-10-CM | POA: Diagnosis not present

## 2017-12-29 DIAGNOSIS — G8929 Other chronic pain: Secondary | ICD-10-CM | POA: Diagnosis present

## 2017-12-29 DIAGNOSIS — Z79899 Other long term (current) drug therapy: Secondary | ICD-10-CM | POA: Diagnosis not present

## 2017-12-29 DIAGNOSIS — S79922A Unspecified injury of left thigh, initial encounter: Secondary | ICD-10-CM | POA: Diagnosis not present

## 2017-12-29 LAB — CBC
HEMATOCRIT: 41.8 % (ref 39.0–52.0)
HEMOGLOBIN: 13.8 g/dL (ref 13.0–17.0)
MCH: 29.9 pg (ref 26.0–34.0)
MCHC: 33 g/dL (ref 30.0–36.0)
MCV: 90.7 fL (ref 78.0–100.0)
Platelets: 151 10*3/uL (ref 150–400)
RBC: 4.61 MIL/uL (ref 4.22–5.81)
RDW: 13.6 % (ref 11.5–15.5)
WBC: 6.4 10*3/uL (ref 4.0–10.5)

## 2017-12-29 LAB — GLUCOSE, CAPILLARY
GLUCOSE-CAPILLARY: 121 mg/dL — AB (ref 65–99)
Glucose-Capillary: 79 mg/dL (ref 65–99)
Glucose-Capillary: 124 mg/dL — ABNORMAL HIGH (ref 65–99)
Glucose-Capillary: 114 mg/dL — ABNORMAL HIGH (ref 65–99)

## 2017-12-29 LAB — ECHOCARDIOGRAM COMPLETE
HEIGHTINCHES: 69 in
Weight: 2853.63 oz

## 2017-12-29 LAB — BASIC METABOLIC PANEL
ANION GAP: 14 (ref 5–15)
BUN: 12 mg/dL (ref 6–20)
CO2: 23 mmol/L (ref 22–32)
Calcium: 9.1 mg/dL (ref 8.9–10.3)
Chloride: 102 mmol/L (ref 101–111)
Creatinine, Ser: 1.22 mg/dL (ref 0.61–1.24)
GFR calc Af Amer: >60 mL/min (ref >60)
GFR calc non Af Amer: 58 mL/min — ABNORMAL LOW (ref >60)
GLUCOSE: 67 mg/dL (ref 65–99)
POTASSIUM: 3.5 mmol/L (ref 3.5–5.1)
Sodium: 139 mmol/L (ref 135–145)

## 2017-12-29 LAB — TROPONIN I: Troponin I: <0.03 ng/mL (ref <0.03)

## 2017-12-29 MED ORDER — HYDRALAZINE HCL 20 MG/ML IJ SOLN
10.0000 mg | INTRAMUSCULAR | Status: DC | PRN
  Administered 2017-12-29: 10 mg via INTRAVENOUS
  Filled 2017-12-29: qty 1

## 2017-12-29 MED ORDER — ALPRAZOLAM 0.5 MG PO TABS
0.5000 mg | ORAL_TABLET | ORAL | Status: DC | PRN
  Administered 2017-12-29: 0.5 mg via ORAL
  Filled 2017-12-29: qty 1

## 2017-12-29 MED ORDER — ALPRAZOLAM 1 MG PO TABS
1.0000 mg | ORAL_TABLET | Freq: Once | ORAL | Status: AC
  Administered 2017-12-29: 1 mg via ORAL
  Filled 2017-12-29: qty 1

## 2017-12-29 MED ORDER — KETOROLAC TROMETHAMINE 15 MG/ML IJ SOLN: 15.0000 mg | Freq: Four times a day (QID) | INTRAMUSCULAR | Status: DC | PRN

## 2017-12-29 MED ORDER — ENOXAPARIN SODIUM 40 MG/0.4ML ~~LOC~~ SOLN
40.0000 mg | SUBCUTANEOUS | Status: DC
  Administered 2017-12-29 – 2017-12-30 (×2): 40 mg via SUBCUTANEOUS
  Filled 2017-12-29 (×2): qty 0.4

## 2017-12-29 MED ORDER — KETOROLAC TROMETHAMINE 15 MG/ML IJ SOLN
15.0000 mg | Freq: Once | INTRAMUSCULAR | Status: AC
  Administered 2017-12-29: 15 mg via INTRAVENOUS
  Filled 2017-12-29: qty 1

## 2017-12-29 MED ORDER — HYDROCODONE-ACETAMINOPHEN 5-325 MG PO TABS
1.0000 | ORAL_TABLET | ORAL | Status: DC | PRN
  Administered 2017-12-29 (×4): 2 via ORAL
  Administered 2017-12-30: 1 via ORAL
  Administered 2017-12-30: 2 via ORAL
  Filled 2017-12-29 (×4): qty 2
  Filled 2017-12-29: qty 1
  Filled 2017-12-29: qty 2

## 2017-12-29 NOTE — Progress Notes (Signed)
12/29/2017 3:29 PM  Pt updated with test results.   MRI  IMPRESSION: 1. L4-L5 moderate spinal canal stenosis with severe right and mild left neural foraminal stenosis, progressed compared to 02/03/2016. 2. L2-L3 moderate spinal canal stenosis, slightly progressed. 3. Unchanged moderate L5-S1 bilateral neural foraminal stenosis.

## 2017-12-29 NOTE — Plan of Care (Signed)
  Problem: Acute Rehab PT Goals(only PT should resolve) Goal: Pt Will Go Sit To Supine/Side Flowsheets (Taken 12/29/2017 1409) Pt will go Sit to Supine/Side: with modified independence Goal: Patient Will Transfer Sit To/From Stand Flowsheets (Taken 12/29/2017 1409) Patient will transfer sit to/from stand: with modified independence;with supervision Goal: Pt Will Ambulate Flowsheets (Taken 12/29/2017 1409) Pt will Ambulate: > 125 feet;with supervision;with rolling walker    Jac Canavan PT, DPT

## 2017-12-29 NOTE — Progress Notes (Signed)
PROGRESS NOTE   [name]  ZOX:[id number]  DOB: November 16, 1945  DOA: 12/28/2017 PCP: Benita Stabile, MD   Brief Admission Hx: [name] is a 72 y.o. male with medical history significant of coronary artery disease, hypertension, diabetes comes in with acute onset of substernal chest pain that did not radiate with associated nausea vomiting and diaphoresis that was resolved with Tums, Pepcid, 3 nitroglycerin.  He is not really sure what helped his chest pain or if it was just time.  It lasted about an hour.  He did not have any associated shortness of breath with it.  He is also complaining of bilateral hip pain and leg pain after 2 recent falls at home.  He has chronic back pain but it has been exacerbated by the falls.    MDM/Assessment & Plan:   1. Chest pain - symptoms have resolved now, Pt is scheduled for echocardiogram today. Troponin negative x 3.  Tele stable. No acute findings.  2. Bilateral hip and leg pain - Pt says his pain is severe exacerbated by 2 recent falls at home, will obtain xrays to rule out occult fracture.  3. Back pain and leg pain - He has radicular symptoms, chronic back pain exacerbated by recent falls.  Obtain MRI L-spine to evaluate.  Get PT evaluation.  Increase pain meds to every 4 hours as needed.    DVT prophylaxis: enoxaparin Code Status:  FULL  Family Communication: wife at bedside Disposition Plan: Pt needs further inpatient diagnostic testing and PT evaluation   Subjective: Pt complains of severe bilateral hip and leg pain and low back pain, untouched by pain medications given, says he fell at home twice recently.   Objective: Vitals:   12/29/17 0058 12/29/17 0100 12/29/17 0600 12/29/17 0601  BP: (!) 175/72 (!) 172/69 (!) 158/63   Pulse:  (!) 53 (!) 56   Resp:  20 20   Temp:  97.6 F (36.4 C) (!) 97.5 F (36.4 C)   TempSrc:  Oral Oral   SpO2:  97% 99%   Weight:    80.9 kg (178 lb 5.6 oz)  Height:        Intake/Output Summary  (Last 24 hours) at 12/29/2017 0806 Last data filed at 12/29/2017 0602 Gross per 24 hour  Intake 1080 ml  Output 1375 ml  Net -295 ml   Filed Weights   12/28/17 1455 12/28/17 1731 12/29/17 0601  Weight: 81.6 kg (180 lb) 79.3 kg (174 lb 12.8 oz) 80.9 kg (178 lb 5.6 oz)   REVIEW OF SYSTEMS  As per history otherwise all reviewed and reported negative  Exam:  General exam: awake, alert, NAD, cooperative.   Respiratory system:  No increased work of breathing. Cardiovascular system: normal S1 & S2 heard. No JVD, murmurs, gallops, clicks or pedal edema. Gastrointestinal system: Abdomen is nondistended, soft and nontender. Normal bowel sounds heard. Central nervous system: Alert and oriented. No focal neurological deficits. Extremities: severe pain with light touch of legs, no edema noted, bilateral hip pain with palpation.  Data Reviewed: Basic Metabolic Panel: Recent Labs  Lab 12/28/17 1515  NA 142  K 3.8  CL 103  CO2 32  GLUCOSE 80  BUN 13  CREATININE 1.29*  CALCIUM 9.5   Liver Function Tests: No results for input(s): AST, ALT, ALKPHOS, BILITOT, PROT, ALBUMIN in the last 168 hours. No results for input(s): LIPASE, AMYLASE in the last 168 hours. No results for input(s): AMMONIA in the last 168 hours. CBC:  Recent Labs  Lab 12/28/17 1515  WBC 5.7  HGB 13.0  HCT 40.0  MCV 91.3  PLT 158   Cardiac Enzymes: Recent Labs  Lab 12/28/17 1515 12/28/17 1703 12/28/17 2242 12/29/17 0526  TROPONINI <0.03 <0.03 <0.03 <0.03   CBG (last 3)  Recent Labs    12/28/17 2147 12/29/17 0736  GLUCAP 190* 79   No results found for this or any previous visit (from the past 240 hour(s)).   Studies: Dg Chest 2 View  Result Date: 12/28/2017 CLINICAL DATA:  Left-sided chest pain that began today. Shortness of breath. EXAM: CHEST - 2 VIEW COMPARISON:  12/09/2014 FINDINGS: Prominent vascular structures or lung markings in the left lower chest appear chronic. Otherwise, the lungs are clear.  Heart and mediastinum are within normal limits. Trachea is midline. No large pleural effusions. Bone structures are unremarkable. IMPRESSION: No active cardiopulmonary disease. Electronically Signed   By: Richarda Overlie M.D.   On: 12/28/2017 15:49   Scheduled Meds: . aspirin EC  81 mg Oral Daily  . atorvastatin  20 mg Oral q1800  . hydrochlorothiazide  25 mg Oral Daily  . insulin aspart  0-9 Units Subcutaneous TID WC  . metoprolol tartrate  50 mg Oral BID  . pantoprazole  40 mg Oral Daily   Continuous Infusions:  Principal Problem:   Chest pain Active Problems:   HLD (hyperlipidemia)   Essential hypertension   CAD, NATIVE VESSEL  Time spent:   Standley Dakins, MD, FAAFP [facility] Pager [phone]  If 7PM-7AM, please contact night-coverage [email or web address] Password [email or web address] 12/29/2017, 8:06 AM    LOS: 0 days

## 2017-12-29 NOTE — Evaluation (Signed)
Physical Therapy Evaluation Patient Details Name: [name] MRN: [id number] DOB: [birth year] Today's Date: 12/29/2017   History of Present Illness  [name] is a 71 y.o. male with medical history significant of coronary artery disease, hypertension, diabetes comes in with acute onset of substernal chest pain that did not radiate with associated nausea vomiting and diaphoresis that was resolved with Tums, Pepcid, 3 nitroglycerin.  He is not really sure what helped his chest pain or if it was just time.  It lasted about an hour.  He did not have any associated shortness of breath with it.  He does not have any lower extremity swelling.  He denies any fevers or cough.  Patient referred for admission for chest pain for possible underlying ACS.  He is currently chest pain-free.  Clinical Impression  Pt received in bed with family present and was agreeable to PT evaluation. Pt from home with wife, 24/7 assist from family, where he was independent with dressing and bathing but required assistance when ambulating with [facility] or rollator due to frequent LOBs backwards. Per pt and family, he tends to lose his balance when ambulating and he states that he is unsure of what causes it, he just loses his balance. He was able to ambulate 251ft today with RW with supervision; bed mobility supervision and transfers supv-min guard due to chronic LBP. Prior to admission, he was amb limited community distances with assistance. PT recommending OPPT upon d/c to address pt's deficits in balance and general strengthening to decrease risk for falls. Will follow acutely to continue to address gait, balance, and overall strength.      Follow Up Recommendations Outpatient PT;Supervision/Assistance - 24 hour    Equipment Recommendations  None recommended by PT    Recommendations for Other Services       Precautions / Restrictions Precautions Precautions: Fall Precaution Comments: pt family, he has had  multiple falls in the last few weeks - pt states he falls backwards when he falls. Restrictions Weight Bearing Restrictions: No      Mobility  Bed Mobility Overal bed mobility: Needs Assistance Bed Mobility: Supine to Sit;Sit to Supine     Supine to sit: Supervision Sit to supine: Supervision      Transfers Overall transfer level: Needs assistance Equipment used: Rolling walker (2 wheeled) Transfers: Sit to/from Stand Sit to Stand: Supervision;Min guard            Ambulation/Gait Ambulation/Gait assistance: Supervision Ambulation Distance (Feet): 250 Feet Assistive device: Rolling walker (2 wheeled) Gait Pattern/deviations: Step-through pattern   Gait velocity interpretation: <1.8 ft/sec, indicate of risk for recurrent falls General Gait Details: slow, labored cadence, no evidence of unsteadiness though pt and his family report he falls backwards when he walks  Stairs            Wheelchair Mobility    Modified Rankin (Stroke Patients Only)       Balance Overall balance assessment: Needs assistance Sitting-balance support: Feet supported Sitting balance-Leahy Scale: Good     Standing balance support: Bilateral upper extremity supported Standing balance-Leahy Scale: Fair Standing balance comment: pt pt and family, h/o falling                             Pertinent Vitals/Pain Pain Assessment: 0-10 Pain Score: 8  Pain Location: back Pain Descriptors / Indicators: Sharp Pain Intervention(s): Limited activity within patient's tolerance;Monitored during session;Repositioned    Home Living Family/patient expects  to be discharged to:: Private residence Living Arrangements: Spouse/significant other Available Help at Discharge: Family;Available 24 hours/day Type of Home: House Home Access: Stairs to enter Entrance Stairs-Rails: None Entrance Stairs-Number of Steps: 5 in front, 1 in back Home Layout: One level Home Equipment: Investment banker, corporate - 4 wheels;Cane - single point;Grab bars - tub/shower;Toilet riser      Prior Function Level of Independence: Needs assistance   Gait / Transfers Assistance Needed: needs assistance with ambulation; per dtr he uses his cane mostly but pt states that it doesn't do him much good. He reports multiple falls and when he falls he falls backwards  ADL's / Homemaking Assistance Needed: wife and pt reports independent with dressing and bathing        Hand Dominance   Dominant Hand: Right    Extremity/Trunk Assessment   Upper Extremity Assessment Upper Extremity Assessment: Overall WFL for tasks assessed    Lower Extremity Assessment Lower Extremity Assessment: Generalized weakness       Communication   Communication: HOH  Cognition Arousal/Alertness: Awake/alert Behavior During Therapy: WFL for tasks assessed/performed Overall Cognitive Status: Within Functional Limits for tasks assessed                                        General Comments      Exercises     Assessment/Plan    PT Assessment Patient needs continued PT services  PT Problem List Decreased balance;Decreased strength;Pain       PT Treatment Interventions Gait training;Stair training;Functional mobility training;Therapeutic activities;Therapeutic exercise;Balance training;Patient/family education    PT Goals (Current goals can be found in the Care Plan section)  Acute Rehab PT Goals Patient Stated Goal: to go home PT Goal Formulation: With patient/family Time For Goal Achievement: 01/05/18 Potential to Achieve Goals: Good    Frequency Min 2X/week   Barriers to discharge        Co-evaluation               AM-PAC PT "6 Clicks" Daily Activity  Outcome Measure                  End of Session Equipment Utilized During Treatment: Gait belt Activity Tolerance: Patient tolerated treatment well;No increased pain Patient left: in bed;with call bell/phone within  reach;with bed alarm set;with family/visitor present Nurse Communication: Mobility status PT Visit Diagnosis: Unsteadiness on feet (R26.81);History of falling (Z91.81);Difficulty in walking, not elsewhere classified (R26.2);Muscle weakness (generalized) (M62.81)    Time: 9604-5409 PT Time Calculation (min) (ACUTE ONLY): 19 min   Charges:   PT Evaluation $PT Eval Low Complexity: 1 Low     PT G Codes:           Jac Canavan PT, DPT

## 2017-12-29 NOTE — Progress Notes (Signed)
*  PRELIMINARY RESULTS* Echocardiogram 2D Echocardiogram has been performed.  Jeryl Columbia 12/29/2017, 3:17 PM

## 2017-12-30 DIAGNOSIS — M48061 Spinal stenosis, lumbar region without neurogenic claudication: Principal | ICD-10-CM

## 2017-12-30 LAB — GLUCOSE, CAPILLARY
Glucose-Capillary: 99 mg/dL (ref 65–99)
Glucose-Capillary: 130 mg/dL — ABNORMAL HIGH (ref 65–99)

## 2017-12-30 LAB — BASIC METABOLIC PANEL
Anion gap: 9 (ref 5–15)
BUN: 11 mg/dL (ref 6–20)
CHLORIDE: 95 mmol/L — AB (ref 101–111)
CO2: 30 mmol/L (ref 22–32)
Calcium: 9 mg/dL (ref 8.9–10.3)
Creatinine, Ser: 1.16 mg/dL (ref 0.61–1.24)
GFR calc non Af Amer: >60 mL/min (ref >60)
Glucose, Bld: 114 mg/dL — ABNORMAL HIGH (ref 65–99)
Potassium: 3.8 mmol/L (ref 3.5–5.1)
SODIUM: 134 mmol/L — AB (ref 135–145)

## 2017-12-30 NOTE — Discharge Summary (Signed)
Physician Discharge Summary  [name] QMV:[id number] DOB: 01/27/1946 DOA: 12/28/2017  PCP: Benita Stabile, MD Neurosurgery: Jeral Fruit  Admit date: 12/28/2017 Discharge date: 12/30/2017  Admitted From:  Home  Disposition: Home   Recommendations for Outpatient Follow-up:  1. Follow up with PCP in 1 weeks 2. Follow up with cardiology in 1-2 weeks to consider outpatient stress testing. 3. Revisit outpatient PT.  Pt declined to receive referral in hospital.   Discharge Condition: STABLE   CODE STATUS: FULL    Brief Hospitalization Summary: Please see all hospital notes, images, labs for full details of the hospitalization.  HPI: [name] is a 72 y.o. male with medical history significant of coronary artery disease, hypertension, diabetes comes in with acute onset of substernal chest pain that did not radiate with associated nausea vomiting and diaphoresis that was resolved with Tums, Pepcid, 3 nitroglycerin.  He is not really sure what helped his chest pain or if it was just time.  It lasted about an hour.  He did not have any associated shortness of breath with it.  He does not have any lower extremity swelling.  He denies any fevers or cough.  Patient referred for admission for chest pain for possible underlying ACS.  He is currently chest pain-free.  Brief Admission Hx: [name] a 71 y.o.malewith medical history significant ofcoronary artery disease, hypertension, diabetes comes in with acute onset of substernal chest pain that did not radiate with associated nausea vomiting and diaphoresis that was resolved with Tums, Pepcid, 3 nitroglycerin. He is not really sure what helped his chest pain or if it was just time. It lasted about an hour. He did not have any associated shortness of breath with it.  He is also complaining of bilateral hip pain and leg pain after 2 recent falls at home.  He has chronic back pain but it has been exacerbated by the falls.     MDM/Assessment & Plan:   1. Chest pain - symptoms have resolved now,  Troponin negative x 3.  Tele stable. No acute findings. Echo with normal EF there were no regional wall motion abnormalities. He should follow up with cardiology for outpatient stress testing consideration.   2. Bilateral hip and leg pain - Pt says his pain is severe exacerbated by 2 recent falls at home,  xrays ruled out occult fracture.  3. Back pain and leg pain - He has radicular symptoms, chronic back pain exacerbated by recent falls.   MRI L-spine revealed chronic disease, worsening of spinal stenosis over the years.  He has been seen by Dr. Jeral Fruit neurosurgery and advised to follow up with that office.  PT recommended Outpatient PT but patient declined.    Echocardiogram Study Conclusions - Left ventricle: The cavity size was normal. Wall thickness was  normal. Systolic function was normal. The estimated ejection   fraction was in the range of 50% to 55%. Wall motion was normal;  there were no regional wall motion abnormalities. Doppler   parameters are consistent with abnormal left ventricular  relaxation (grade 1 diastolic dysfunction). - Aortic valve: There was mild regurgitation. Valve area (VTI):  2.42 cm^2. Valve area (Vmax): 2.32 cm^2. - Left atrium: The atrium was moderately dilated. - Technically adequate study.  DVT prophylaxis: enoxaparin Code Status:  FULL  Family Communication: wife at bedside Disposition Plan: Pt stable for discharge home   Discharge Diagnoses:  Principal Problem:   Chest pain Active Problems:   HLD (hyperlipidemia)  Essential hypertension   CAD, NATIVE VESSEL   Intractable back pain  Discharge Instructions: Discharge Instructions    Call MD for:  extreme fatigue   Complete by:  As directed    Call MD for:  persistant dizziness or light-headedness   Complete by:  As directed    Call MD for:  persistant nausea and vomiting   Complete by:  As directed    Call MD for:   severe uncontrolled pain   Complete by:  As directed    Increase activity slowly   Complete by:  As directed      Allergies as of 12/30/2017      Reactions   Morphine Itching, Rash   Sulfonamide Derivatives    REACTION: Rash      Medication List    TAKE these medications   ACCU-CHEK AVIVA PLUS test strip Generic drug:  glucose blood   ALPRAZolam 1 MG tablet Commonly known as:  XANAX Take 1 mg by mouth 2 (two) times daily as needed.   aspirin 81 MG tablet Take 81 mg by mouth daily.   atorvastatin 20 MG tablet Commonly known as:  LIPITOR Take 20 mg by mouth daily.   cyclobenzaprine 5 MG tablet Commonly known as:  FLEXERIL Take 1 tablet by mouth every 6 (six) hours as needed.   Diclofenac Sodium 3 % Gel   DULoxetine 60 MG capsule Commonly known as:  CYMBALTA Take 60 mg by mouth daily.   gabapentin 300 MG capsule Commonly known as:  NEURONTIN Take 300 mg by mouth 3 (three) times daily.   glimepiride 2 MG tablet Commonly known as:  AMARYL Take 2 mg by mouth daily.   hydrochlorothiazide 25 MG tablet Commonly known as:  HYDRODIURIL Take 25 mg by mouth daily.   HYDROcodone-acetaminophen 10-325 MG tablet Commonly known as:  NORCO Take 1 tablet by mouth every 6 (six) hours as needed.   levothyroxine 100 MCG tablet Commonly known as:  SYNTHROID, LEVOTHROID Take 100 mcg by mouth daily.   lisinopril 2.5 MG tablet Commonly known as:  PRINIVIL,ZESTRIL Take 2.5 mg by mouth daily.   metFORMIN 500 MG tablet Commonly known as:  GLUCOPHAGE Take 500 mg by mouth 2 (two) times daily as needed.   metoprolol tartrate 50 MG tablet Commonly known as:  LOPRESSOR Take 50 mg by mouth 2 (two) times daily.   nitroGLYCERIN 0.4 MG SL tablet Commonly known as:  NITROSTAT Place 0.4 mg under the tongue every 5 (five) minutes as needed for chest pain.   omega-3 acid ethyl esters 1 g capsule Commonly known as:  LOVAZA Take 2 capsules by mouth 2 (two) times daily.    pantoprazole 40 MG tablet Commonly known as:  PROTONIX Take 40 mg by mouth daily.      Follow-up Information    Benita Stabile, MD. Schedule an appointment as soon as possible for a visit in 1 week(s).   Specialty:  Internal Medicine Why:  Hospital Follow Up  Contact information: [address] [place] [facility] [address] [phone]          Allergies  Allergen Reactions  . Morphine Itching and Rash  . Sulfonamide Derivatives     REACTION: Rash   Allergies as of 12/30/2017      Reactions   Morphine Itching, Rash   Sulfonamide Derivatives    REACTION: Rash      Medication List    TAKE these medications   ACCU-CHEK AVIVA PLUS test strip Generic drug:  glucose blood   ALPRAZolam 1 MG tablet Commonly known as:  XANAX Take 1 mg by mouth 2 (two) times daily as needed.   aspirin 81 MG tablet Take 81 mg by mouth daily.   atorvastatin 20 MG tablet Commonly known as:  LIPITOR Take 20 mg by mouth daily.   cyclobenzaprine 5 MG tablet Commonly known as:  FLEXERIL Take 1 tablet by mouth every 6 (six) hours as needed.   Diclofenac Sodium 3 % Gel   DULoxetine 60 MG capsule Commonly known as:  CYMBALTA Take 60 mg by mouth daily.   gabapentin 300 MG capsule Commonly known as:  NEURONTIN Take 300 mg by mouth 3 (three) times daily.   glimepiride 2 MG tablet Commonly known as:  AMARYL Take 2 mg by mouth daily.   hydrochlorothiazide 25 MG tablet Commonly known as:  HYDRODIURIL Take 25 mg by mouth daily.   HYDROcodone-acetaminophen 10-325 MG tablet Commonly known as:  NORCO Take 1 tablet by mouth every 6 (six) hours as needed.   levothyroxine 100 MCG tablet Commonly known as:  SYNTHROID, LEVOTHROID Take 100 mcg by mouth daily.   lisinopril 2.5 MG tablet Commonly known as:  PRINIVIL,ZESTRIL Take 2.5 mg by mouth daily.   metFORMIN 500 MG tablet Commonly known as:  GLUCOPHAGE Take 500 mg by mouth 2 (two) times daily as needed.   metoprolol tartrate 50 MG  tablet Commonly known as:  LOPRESSOR Take 50 mg by mouth 2 (two) times daily.   nitroGLYCERIN 0.4 MG SL tablet Commonly known as:  NITROSTAT Place 0.4 mg under the tongue every 5 (five) minutes as needed for chest pain.   omega-3 acid ethyl esters 1 g capsule Commonly known as:  LOVAZA Take 2 capsules by mouth 2 (two) times daily.   pantoprazole 40 MG tablet Commonly known as:  PROTONIX Take 40 mg by mouth daily.       Procedures/Studies: Dg Chest 2 View  Result Date: 12/28/2017 CLINICAL DATA:  Left-sided chest pain that began today. Shortness of breath. EXAM: CHEST - 2 VIEW COMPARISON:  12/09/2014 FINDINGS: Prominent vascular structures or lung markings in the left lower chest appear chronic. Otherwise, the lungs are clear. Heart and mediastinum are within normal limits. Trachea is midline. No large pleural effusions. Bone structures are unremarkable. IMPRESSION: No active cardiopulmonary disease. Electronically Signed   By: Richarda Overlie M.D.   On: 12/28/2017 15:49   Dg Pelvis 1-2 Views  Result Date: 12/29/2017 CLINICAL DATA:  Status post fall x3 over the past 2 weeks. Bilateral hip pain radiating to the knees. Initial encounter. EXAM: PELVIS - 1-2 VIEW COMPARISON:  CT abdomen and pelvis 10/25/2016. FINDINGS: There is no evidence of pelvic fracture or diastasis. No pelvic bone lesions are seen. Mild bilateral hip degenerative change is seen. IMPRESSION: No acute abnormality.  Mild bilateral hip degenerative change noted. Electronically Signed   By: Drusilla Kanner M.D.   On: 12/29/2017 10:30   Mr Lumbar Spine Wo Contrast  Result Date: 12/29/2017 CLINICAL DATA:  Low back pain and difficulty walking after lifting injury. EXAM: MRI LUMBAR SPINE WITHOUT CONTRAST TECHNIQUE: Multiplanar, multisequence MR imaging of the lumbar spine was performed. No intravenous contrast was administered. COMPARISON:  Lumbar spine MRI 02/03/2016 FINDINGS: Segmentation: Normal. The lowest disc space is considered  to be L5-S1. Alignment:  Normal Vertebrae: Unchanged L2 inferior endplate Schmorl's node. No fracture, discitis-osteomyelitis or epidural collection. Conus medullaris and cauda equina: The conus medullaris terminates at the L1 level. The cauda equina and  conus medullaris are both normal. 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 Disc levels: T11-T12: No disc herniation or stenosis. T12-L1: Disc desiccation without herniation or stenosis. L1-L2: Disc desiccation with minimal bulge.  No stenosis. L2-L3: Intermediate sized disc bulge with mild facet hypertrophy. Moderate spinal canal stenosis and mild bilateral neural foraminal stenosis. Spinal canal stenosis has worsened slightly. L3-L4: Mild disc bulge, unchanged. Mild narrowing of the spinal canal is also unchanged. No neural impingement. L4-L5: Small central disc extrusion with inferior migration to the L5 suprapedicle level has partially involuted. Facet hypertrophy and ligamentum flavum redundancy have worsened. There is now moderate spinal canal stenosis, worsened from the prior study. Severe right and mild left neural foraminal stenosis. The right-sided stenosis has worsened slightly. L5-S1: Remote right hemilaminectomy. Unchanged disc space narrowing with mild bulge. No spinal canal stenosis. Unchanged moderate bilateral neural foraminal stenosis. IMPRESSION: 1. L4-L5 moderate spinal canal stenosis with severe right and mild left neural foraminal stenosis, progressed compared to 02/03/2016. 2. L2-L3 moderate spinal canal stenosis, slightly progressed. 3. Unchanged moderate L5-S1 bilateral neural foraminal stenosis. Electronically Signed   By: Deatra Robinson M.D.   On: 12/29/2017 13:52   Dg Femur Min 2 Views Left  Result Date: 12/29/2017 CLINICAL DATA:  Status post fall x3 over the past 2 weeks. Bilateral hip pain radiating to the knees. Initial encounter. EXAM: LEFT FEMUR 2 VIEWS COMPARISON:  None.  FINDINGS: There is no evidence of fracture or other focal bone lesions. Chondrocalcinosis of the menisci is incidentally noted. Soft tissues are otherwise unremarkable. IMPRESSION: No acute abnormality. Electronically Signed   By: Drusilla Kanner M.D.   On: 12/29/2017 10:32   Dg Femur, Min 2 Views Right  Result Date: 12/29/2017 CLINICAL DATA:  Status post fall x3 over the past 2 weeks. Bilateral hip pain radiating to the knees. Initial encounter. EXAM: RIGHT FEMUR 2 VIEWS COMPARISON:  None. FINDINGS: There is no evidence of fracture or other focal bone lesions. Soft tissues are unremarkable. IMPRESSION: Negative exam. Electronically Signed   By: Drusilla Kanner M.D.   On: 12/29/2017 10:31      Subjective: The patient denies having any more chest pain.  He denies shortness of breath.  He does have chronic low back pain but it is unchanged.  Discharge Exam: Vitals:   12/30/17 0552 12/30/17 0932  BP: 134/74 (!) 153/69  Pulse: 66 67  Resp: 20   Temp: 98.1 F (36.7 C)   SpO2: 97%    Vitals:   12/29/17 1405 12/29/17 2141 12/30/17 0552 12/30/17 0932  BP: (!) 150/92 (!) 167/78 134/74 (!) 153/69  Pulse: (!) 58 60 66 67  Resp: 16 (!) 24 20   Temp: (!) 97.5 F (36.4 C) 97.9 F (36.6 C) 98.1 F (36.7 C)   TempSrc: Oral Oral Oral   SpO2: 99% 99% 97%   Weight:      Height:       General: Pt is alert, awake, not in acute distress Cardiovascular: normal S1/S2 +, no rubs, no gallops Respiratory: CTA bilaterally, no wheezing, no rhonchi Abdominal: Soft, NT, ND, bowel sounds + Extremities: no edema, no cyanosis   The results of significant diagnostics from this hospitalization (including imaging, microbiology, ancillary and laboratory) are listed below for reference.     Microbiology: No results found for this or any previous visit (from the past 240 hour(s)).   Labs: BNP (last 3 results) No results for input(s): BNP in the last 8760 hours.  Basic Metabolic Panel: Recent Labs  Lab  12/28/17 1515 12/29/17 0526 12/30/17 0646  NA 142 139 134*  K 3.8 3.5 3.8  CL 103 102 95*  CO2 32 23 30  GLUCOSE 80 67 114*  BUN CREATININE 1.29* 1.22 1.16  CALCIUM 9.5 9.1 9.0   Liver Function Tests: No results for input(s): AST, ALT, ALKPHOS, BILITOT, PROT, ALBUMIN in the last 168 hours. No results for input(s): LIPASE, AMYLASE in the last 168 hours. No results for input(s): AMMONIA in the last 168 hours. CBC: Recent Labs  Lab 12/28/17 1515 12/29/17 0526  WBC 5.7 6.4  HGB 13.0 13.8  HCT 40.0 41.8  MCV 91.3 90.7  PLT 158 151   Cardiac Enzymes: Recent Labs  Lab 12/28/17 1515 12/28/17 1703 12/28/17 2242 12/29/17 0526  TROPONINI <0.03 <0.03 <0.03 <0.03   BNP: Invalid input(s): POCBNP CBG: Recent Labs  Lab 12/29/17 1158 12/29/17 1641 12/29/17 2139 12/30/17 0732 12/30/17 1118  GLUCAP 124* 114* 121* 99 130*   D-Dimer No results for input(s): DDIMER in the last 72 hours. Hgb A1c Recent Labs    12/28/17 1658  HGBA1C 6.2*   Lipid Profile No results for input(s): CHOL, HDL, LDLCALC, TRIG, CHOLHDL, LDLDIRECT in the last 72 hours. Thyroid function studies No results for input(s): TSH, T4TOTAL, T3FREE, THYROIDAB in the last 72 hours.  Invalid input(s): FREET3 Anemia work up No results for input(s): VITAMINB12, FOLATE, FERRITIN, TIBC, IRON, RETICCTPCT in the last 72 hours. Urinalysis No results found for: COLORURINE, APPEARANCEUR, LABSPEC, PHURINE, GLUCOSEU, HGBUR, BILIRUBINUR, KETONESUR, PROTEINUR, UROBILINOGEN, NITRITE, LEUKOCYTESUR Sepsis Labs Invalid input(s): PROCALCITONIN,  WBC,  LACTICIDVEN Microbiology No results found for this or any previous visit (from the past 240 hour(s)).  Time coordinating discharge:   SIGNED:  Standley Dakins, MD  [facility] 12/30/2017, 11:46 AM Pager [phone]  If 7PM-7AM, please contact night-coverage [email or web address] Password [email or web address]

## 2017-12-30 NOTE — Care Management Important Message (Signed)
Important Message  Patient Details  Name: [name] MRN: [id number] Date of Birth: [birth year]   Medicare Important Message Given:  Yes    Malcolm Metro, RN 12/30/2017, 10:12 AM

## 2017-12-30 NOTE — Care Management Note (Signed)
Case Management Note  Patient Details  Name: [name] MRN: [id number] Date of Birth: [birth year]  Subjective/Objective:      Admitted for chest pain. Pt from home, lives with spouse. ind with ADL's. Has PCP, transportation and insurance with drug coverage.               Action/Plan: DC home with self care. Pt recommended for OP PT. PT declines saying "PT makes his back hurt worse than it already does, everything makes his back hurt, it hurts all the time".  Expected Discharge Date:    12/30/17              Expected Discharge Plan:  Home/Self Care  In-House Referral:  NA  Discharge planning Services  CM Consult  Post Acute Care Choice:  NA Choice offered to:  NA  Status of Service:  Completed, signed off   Malcolm Metro, RN 12/30/2017, 10:40 AM

## 2017-12-30 NOTE — Discharge Instructions (Signed)
Follow with Primary MD  Benita Stabile, MD  and other consultant's as instructed your Hospitalist MD  Please get a complete blood count and chemistry panel checked by your Primary MD at your next visit, and again as instructed by your Primary MD.  Get Medicines reviewed and adjusted: Please take all your medications with you for your next visit with your Primary MD  Laboratory/radiological data: Please request your Primary MD to go over all hospital tests and procedure/radiological results at the follow up, please ask your Primary MD to get all Hospital records sent to his/her office.  In some cases, they will be blood work, cultures and biopsy results pending at the time of your discharge. Please request that your primary care M.D. follows up on these results.  Also Note the following: If you experience worsening of your admission symptoms, develop shortness of breath, life threatening emergency, suicidal or homicidal thoughts you must seek medical attention immediately by calling 911 or calling your MD immediately  if symptoms less severe.  You must read complete instructions/literature along with all the possible adverse reactions/side effects for all the Medicines you take and that have been prescribed to you. Take any new Medicines after you have completely understood and accpet all the possible adverse reactions/side effects.   Do not drive when taking Pain medications or sleeping medications (Benzodaizepines)  Do not take more than prescribed Pain, Sleep and Anxiety Medications. It is not advisable to combine anxiety,sleep and pain medications without talking with your primary care practitioner  Special Instructions: If you have smoked or chewed Tobacco  in the last 2 yrs please stop smoking, stop any regular Alcohol  and or any Recreational drug use.  Wear Seat belts while driving.  Please note: You were cared for by a hospitalist during your hospital stay. Once you are discharged,  your primary care physician will handle any further medical issues. Please note that NO REFILLS for any discharge medications will be authorized once you are discharged, as it is imperative that you return to your primary care physician (or establish a relationship with a primary care physician if you do not have one) for your post hospital discharge needs so that they can reassess your need for medications and monitor your lab values.     Fall Prevention in the Home Falls can cause injuries. They can happen to people of all ages. There are many things you can do to make your home safe and to help prevent falls. What can I do on the outside of my home?  Regularly fix the edges of walkways and driveways and fix any cracks.  Remove anything that might make you trip as you walk through a door, such as a raised step or threshold.  Trim any bushes or trees on the path to your home.  Use bright outdoor lighting.  Clear any walking paths of anything that might make someone trip, such as rocks or tools.  Regularly check to see if handrails are loose or broken. Make sure that both sides of any steps have handrails.  Any raised decks and porches should have guardrails on the edges.  Have any leaves, snow, or ice cleared regularly.  Use sand or salt on walking paths during winter.  Clean up any spills in your garage right away. This includes oil or grease spills. What can I do in the bathroom?  Use night lights.  Install grab bars by the toilet and in the tub and shower. Do not  use towel bars as grab bars.  Use non-skid mats or decals in the tub or shower.  If you need to sit down in the shower, use a plastic, non-slip stool.  Keep the floor dry. Clean up any water that spills on the floor as soon as it happens.  Remove soap buildup in the tub or shower regularly.  Attach bath mats securely with double-sided non-slip rug tape.  Do not have throw rugs and other things on the floor  that can make you trip. What can I do in the bedroom?  Use night lights.  Make sure that you have a light by your bed that is easy to reach.  Do not use any sheets or blankets that are too big for your bed. They should not hang down onto the floor.  Have a firm chair that has side arms. You can use this for support while you get dressed.  Do not have throw rugs and other things on the floor that can make you trip. What can I do in the kitchen?  Clean up any spills right away.  Avoid walking on wet floors.  Keep items that you use a lot in easy-to-reach places.  If you need to reach something above you, use a strong step stool that has a grab bar.  Keep electrical cords out of the way.  Do not use floor polish or wax that makes floors slippery. If you must use wax, use non-skid floor wax.  Do not have throw rugs and other things on the floor that can make you trip. What can I do with my stairs?  Do not leave any items on the stairs.  Make sure that there are handrails on both sides of the stairs and use them. Fix handrails that are broken or loose. Make sure that handrails are as long as the stairways.  Check any carpeting to make sure that it is firmly attached to the stairs. Fix any carpet that is loose or worn.  Avoid having throw rugs at the top or bottom of the stairs. If you do have throw rugs, attach them to the floor with carpet tape.  Make sure that you have a light switch at the top of the stairs and the bottom of the stairs. If you do not have them, ask someone to add them for you. What else can I do to help prevent falls?  Wear shoes that: ? Do not have high heels. ? Have rubber bottoms. ? Are comfortable and fit you well. ? Are closed at the toe. Do not wear sandals.  If you use a stepladder: ? Make sure that it is fully opened. Do not climb a closed stepladder. ? Make sure that both sides of the stepladder are locked into place. ? Ask someone to hold it  for you, if possible.  Clearly mark and make sure that you can see: ? Any grab bars or handrails. ? First and last steps. ? Where the edge of each step is.  Use tools that help you move around (mobility aids) if they are needed. These include: ? Canes. ? Walkers. ? Scooters. ? Crutches.  Turn on the lights when you go into a dark area. Replace any light bulbs as soon as they burn out.  Set up your furniture so you have a clear path. Avoid moving your furniture around.  If any of your floors are uneven, fix them.  If there are any pets around you, be aware of  where they are.  Review your medicines with your doctor. Some medicines can make you feel dizzy. This can increase your chance of falling. Ask your doctor what other things that you can do to help prevent falls. This information is not intended to replace advice given to you by your health care provider. Make sure you discuss any questions you have with your health care provider. Document Released: 06/05/2009 Document Revised: 01/15/2016 Document Reviewed: 09/13/2014 Elsevier Interactive Patient Education  [employer].

## 2018-01-10 DIAGNOSIS — M4800 Spinal stenosis, site unspecified: Secondary | ICD-10-CM | POA: Diagnosis not present

## 2018-01-10 DIAGNOSIS — M65331 Trigger finger, right middle finger: Secondary | ICD-10-CM | POA: Diagnosis not present

## 2018-01-10 DIAGNOSIS — R296 Repeated falls: Secondary | ICD-10-CM | POA: Diagnosis not present

## 2018-01-24 DIAGNOSIS — M48061 Spinal stenosis, lumbar region without neurogenic claudication: Secondary | ICD-10-CM | POA: Diagnosis not present

## 2018-02-01 ENCOUNTER — Ambulatory Visit: Payer: Medicare Other | Admitting: Student

## 2018-02-06 ENCOUNTER — Other Ambulatory Visit: Payer: Self-pay

## 2018-02-06 NOTE — Patient Outreach (Signed)
[facility]) Care Management  02/06/2018  Ronald KettleKenneth V Armstrong 1946/08/08 [id number]     Telephone Screen  Referral Date: 02/06/18 Referral Source: Urgent- MD Office Referral Reason: " COPD, DM, HTN, evaluate for services-may need LCSW- patient feels no once cares-really needs rescources" [facility]   Outreach attempt # 1 to patient. No answer at present. RN CM left HIPAA compliant voicemail message along with contact info.      Plan: RN CM will make outreach attempt to patient within three business days. RN CM will send unsuccessful outreach letter to patient.    Antionette Fairyoshanda Novah Nessel, RN,BSN,CCM [facility] Care Management Telephonic Care Management Coordinator Direct Phone: [phone] Toll Free: [phone] Fax: [phone]

## 2018-02-07 ENCOUNTER — Other Ambulatory Visit: Payer: Self-pay

## 2018-02-07 NOTE — Patient Outreach (Signed)
[facility]) Care Management  02/07/2018  Lona KettleKenneth V Vidaurri [birth year] [id number]   Telephone Screen  Referral Date: 02/06/18 Referral Source: Urgent- MD Office Referral Reason: " COPD, DM, HTN, evaluate for services-may need LCSW- patient feels no once cares-really needs rescources" [facility]   Outreach attempt #2 to patient. Spoke with patient and screening completed. Patient a little upset regarding referral and that he was not made aware by MD office of referral.   Social: Patient resides in his home along with his dtr. He voices that he is independent with ADLs and IADLs. He states that he drives himself to most appts or either his dtr takes him. Patient reports that he uses walker or cane to get around. He voices multiple falls in the home. He states about six falls within the past three months and most recent fall being this past Sunday. Patient states that his "legs just won't hold up."   Conditions: Per chart review, patient has PMH of CAD, HTN, HLD, DM, spinal stenosis and stroke. Patietn voices that his diabetes is borderline and currently cotnrolled. Most recent A1C was 6.2(12/28/17). He voices that he checks cbgs once a day and reports WNL readings. He has Bp monitor in the home and checks Bp several times per week. He denies any issues with BP. Patient denies needing further RN education/support and assistance in managing his conditions.    Medications: Patient reported he is taking about 14 meds. He denies any issues affording meds. He states that he has both Medicare and Medicaid. He reports that he manages his meds on his own by taking them directly from pill bottle and does not need assistance.  [facility] reviewed and discussed with patient. Patient declined services saying he "would rather not" and then he reported he "just needed to get back on pain regimen and everything would be alright." He then went on to report that he has  "three sisters who are nurses and a sister who is a retired NP." He reported that they all call and come check on him frequently. Patient adamant that he does not need [facility] assistance at this time. Advised patient that RN has sent out [facility] brochure and contact info for future reference and he may call for any future needs or concerns..      Plan: RN CM will close case at this time. RN CM will send MD case closure letter.   Antionette Fairyoshanda Kalani Sthilaire, RN,BSN,CCM [facility] Care Management Telephonic Care Management Coordinator Direct Phone: [phone] Toll Free: [phone] Fax: [phone]

## 2018-02-08 ENCOUNTER — Ambulatory Visit: Payer: Self-pay

## 2018-02-08 DIAGNOSIS — R296 Repeated falls: Secondary | ICD-10-CM | POA: Diagnosis not present

## 2018-02-08 DIAGNOSIS — G8929 Other chronic pain: Secondary | ICD-10-CM | POA: Diagnosis not present

## 2018-02-08 DIAGNOSIS — M65331 Trigger finger, right middle finger: Secondary | ICD-10-CM | POA: Diagnosis not present

## 2018-02-08 DIAGNOSIS — M4800 Spinal stenosis, site unspecified: Secondary | ICD-10-CM | POA: Diagnosis not present

## 2018-03-05 DIAGNOSIS — R402441 Other coma, without documented Glasgow coma scale score, or with partial score reported, in the field [EMT or ambulance]: Secondary | ICD-10-CM | POA: Diagnosis not present

## 2018-03-05 DIAGNOSIS — K219 Gastro-esophageal reflux disease without esophagitis: Secondary | ICD-10-CM | POA: Diagnosis not present

## 2018-03-05 DIAGNOSIS — R531 Weakness: Secondary | ICD-10-CM | POA: Diagnosis not present

## 2018-03-05 DIAGNOSIS — W1839XA Other fall on same level, initial encounter: Secondary | ICD-10-CM | POA: Diagnosis not present

## 2018-03-05 DIAGNOSIS — S299XXA Unspecified injury of thorax, initial encounter: Secondary | ICD-10-CM | POA: Diagnosis not present

## 2018-03-05 DIAGNOSIS — S0001XA Abrasion of scalp, initial encounter: Secondary | ICD-10-CM | POA: Diagnosis not present

## 2018-03-05 DIAGNOSIS — R52 Pain, unspecified: Secondary | ICD-10-CM | POA: Diagnosis not present

## 2018-03-05 DIAGNOSIS — I252 Old myocardial infarction: Secondary | ICD-10-CM | POA: Diagnosis not present

## 2018-03-05 DIAGNOSIS — Z7984 Long term (current) use of oral hypoglycemic drugs: Secondary | ICD-10-CM | POA: Diagnosis not present

## 2018-03-05 DIAGNOSIS — I10 Essential (primary) hypertension: Secondary | ICD-10-CM | POA: Diagnosis not present

## 2018-03-05 DIAGNOSIS — M542 Cervicalgia: Secondary | ICD-10-CM | POA: Diagnosis not present

## 2018-03-05 DIAGNOSIS — Z79899 Other long term (current) drug therapy: Secondary | ICD-10-CM | POA: Diagnosis not present

## 2018-03-05 DIAGNOSIS — I2510 Atherosclerotic heart disease of native coronary artery without angina pectoris: Secondary | ICD-10-CM | POA: Diagnosis not present

## 2018-03-05 DIAGNOSIS — Z23 Encounter for immunization: Secondary | ICD-10-CM | POA: Diagnosis not present

## 2018-03-05 DIAGNOSIS — E039 Hypothyroidism, unspecified: Secondary | ICD-10-CM | POA: Diagnosis not present

## 2018-03-05 DIAGNOSIS — S199XXA Unspecified injury of neck, initial encounter: Secondary | ICD-10-CM | POA: Diagnosis not present

## 2018-03-05 DIAGNOSIS — E119 Type 2 diabetes mellitus without complications: Secondary | ICD-10-CM | POA: Diagnosis not present

## 2018-03-05 DIAGNOSIS — S0990XA Unspecified injury of head, initial encounter: Secondary | ICD-10-CM | POA: Diagnosis not present

## 2018-03-07 ENCOUNTER — Emergency Department (HOSPITAL_COMMUNITY): Payer: Medicare Other

## 2018-03-07 ENCOUNTER — Encounter (HOSPITAL_COMMUNITY): Payer: Self-pay | Admitting: Emergency Medicine

## 2018-03-07 ENCOUNTER — Other Ambulatory Visit: Payer: Self-pay

## 2018-03-07 ENCOUNTER — Observation Stay (HOSPITAL_COMMUNITY)
Admission: EM | Admit: 2018-03-07 | Discharge: 2018-03-08 | Disposition: A | Discharge status: Home / Self Care | Payer: Medicare Other | Attending: Internal Medicine | Admitting: Internal Medicine

## 2018-03-07 DIAGNOSIS — E1122 Type 2 diabetes mellitus with diabetic chronic kidney disease: Secondary | ICD-10-CM | POA: Diagnosis not present

## 2018-03-07 DIAGNOSIS — G8929 Other chronic pain: Secondary | ICD-10-CM | POA: Diagnosis present

## 2018-03-07 DIAGNOSIS — I10 Essential (primary) hypertension: Secondary | ICD-10-CM | POA: Diagnosis present

## 2018-03-07 DIAGNOSIS — E782 Mixed hyperlipidemia: Secondary | ICD-10-CM | POA: Diagnosis present

## 2018-03-07 DIAGNOSIS — R1011 Right upper quadrant pain: Secondary | ICD-10-CM | POA: Insufficient documentation

## 2018-03-07 DIAGNOSIS — Z7984 Long term (current) use of oral hypoglycemic drugs: Secondary | ICD-10-CM | POA: Diagnosis not present

## 2018-03-07 DIAGNOSIS — M545 Low back pain: Secondary | ICD-10-CM | POA: Insufficient documentation

## 2018-03-07 DIAGNOSIS — M549 Dorsalgia, unspecified: Secondary | ICD-10-CM

## 2018-03-07 DIAGNOSIS — R0789 Other chest pain: Secondary | ICD-10-CM | POA: Diagnosis not present

## 2018-03-07 DIAGNOSIS — N183 Chronic kidney disease, stage 3 unspecified: Secondary | ICD-10-CM

## 2018-03-07 DIAGNOSIS — E039 Hypothyroidism, unspecified: Secondary | ICD-10-CM | POA: Diagnosis not present

## 2018-03-07 DIAGNOSIS — E785 Hyperlipidemia, unspecified: Secondary | ICD-10-CM | POA: Diagnosis not present

## 2018-03-07 DIAGNOSIS — Z79899 Other long term (current) drug therapy: Secondary | ICD-10-CM | POA: Insufficient documentation

## 2018-03-07 DIAGNOSIS — R0602 Shortness of breath: Secondary | ICD-10-CM | POA: Diagnosis not present

## 2018-03-07 DIAGNOSIS — I2510 Atherosclerotic heart disease of native coronary artery without angina pectoris: Secondary | ICD-10-CM | POA: Diagnosis not present

## 2018-03-07 DIAGNOSIS — Z7982 Long term (current) use of aspirin: Secondary | ICD-10-CM | POA: Diagnosis not present

## 2018-03-07 DIAGNOSIS — I129 Hypertensive chronic kidney disease with stage 1 through stage 4 chronic kidney disease, or unspecified chronic kidney disease: Secondary | ICD-10-CM | POA: Diagnosis not present

## 2018-03-07 DIAGNOSIS — F419 Anxiety disorder, unspecified: Secondary | ICD-10-CM | POA: Diagnosis present

## 2018-03-07 DIAGNOSIS — R079 Chest pain, unspecified: Secondary | ICD-10-CM | POA: Diagnosis not present

## 2018-03-07 DIAGNOSIS — I200 Unstable angina: Secondary | ICD-10-CM | POA: Diagnosis present

## 2018-03-07 HISTORY — DX: Ventricular premature depolarization: I49.3

## 2018-03-07 HISTORY — DX: Other supraventricular tachycardia: I47.19

## 2018-03-07 HISTORY — DX: Tachycardia, unspecified: R00.0

## 2018-03-07 HISTORY — DX: Nonrheumatic aortic (valve) insufficiency: I35.1

## 2018-03-07 HISTORY — DX: Other problems related to lifestyle: Z72.89

## 2018-03-07 HISTORY — DX: Pericardial effusion (noninflammatory): I31.3

## 2018-03-07 HISTORY — DX: Disorder of kidney and ureter, unspecified: N28.9

## 2018-03-07 HISTORY — DX: Supraventricular tachycardia: I47.1

## 2018-03-07 HISTORY — DX: Alcohol use, unspecified, uncomplicated: F10.90

## 2018-03-07 HISTORY — DX: Other pericardial effusion (noninflammatory): I31.39

## 2018-03-07 HISTORY — DX: Other specified health status: Z78.9

## 2018-03-07 HISTORY — DX: Major depressive disorder, single episode, unspecified: F32.9

## 2018-03-07 HISTORY — DX: Ventricular tachycardia: I47.2

## 2018-03-07 HISTORY — DX: Depression, unspecified: F32.A

## 2018-03-07 LAB — CBC
HCT: 43.5 % (ref 39.0–52.0)
Hemoglobin: 14.6 g/dL (ref 13.0–17.0)
MCH: 30.6 pg (ref 26.0–34.0)
MCHC: 33.6 g/dL (ref 30.0–36.0)
MCV: 91.2 fL (ref 78.0–100.0)
Platelets: 209 10*3/uL (ref 150–400)
RBC: 4.77 MIL/uL (ref 4.22–5.81)
RDW: 13.6 % (ref 11.5–15.5)
WBC: 9.3 10*3/uL (ref 4.0–10.5)

## 2018-03-07 LAB — BASIC METABOLIC PANEL
ANION GAP: 9 (ref 5–15)
BUN: 19 mg/dL (ref 8–23)
CHLORIDE: 103 mmol/L (ref 98–111)
CO2: 24 mmol/L (ref 22–32)
Calcium: 9.2 mg/dL (ref 8.9–10.3)
Creatinine, Ser: 1.43 mg/dL — ABNORMAL HIGH (ref 0.61–1.24)
GFR calc non Af Amer: 48 mL/min — ABNORMAL LOW (ref >60)
GFR, EST AFRICAN AMERICAN: 56 mL/min — AB (ref >60)
Glucose, Bld: 172 mg/dL — ABNORMAL HIGH (ref 70–99)
POTASSIUM: 4.4 mmol/L (ref 3.5–5.1)
Sodium: 136 mmol/L (ref 135–145)

## 2018-03-07 LAB — TROPONIN I: Troponin I: <0.03 ng/mL (ref <0.03)

## 2018-03-07 LAB — LIPASE, BLOOD: Lipase: 33 U/L (ref 11–51)

## 2018-03-07 MED ORDER — NITROGLYCERIN 2 % TD OINT
1.0000 [in_us] | TOPICAL_OINTMENT | Freq: Once | TRANSDERMAL | Status: AC
  Administered 2018-03-07: 1 [in_us] via TOPICAL
  Filled 2018-03-07: qty 1

## 2018-03-07 MED ORDER — GI COCKTAIL ~~LOC~~
30.0000 mL | Freq: Once | ORAL | Status: AC
  Administered 2018-03-07: 30 mL via ORAL
  Filled 2018-03-07: qty 30

## 2018-03-07 MED ORDER — ONDANSETRON HCL 4 MG/2ML IJ SOLN
4.0000 mg | Freq: Once | INTRAMUSCULAR | Status: AC
  Administered 2018-03-07: 4 mg via INTRAVENOUS
  Filled 2018-03-07: qty 2

## 2018-03-07 NOTE — ED Provider Notes (Signed)
[facility] EMERGENCY DEPARTMENT Provider Note   CSN: [id number] Arrival date & time: 03/07/18  2139     History   Chief Complaint Chief Complaint  Patient presents with  . Chest Pain    HPI [name] is a 72 y.o. male.  Pt presents to the ED today with CP.  The pt said he started getting sob this evening and then developed the cp.  This feels similar to when he had a heart attack 11 years ago.  He took 4 baby asa and 2 nitro which helped the pain a little bit.  Pt describes the pain as sharp and stabbing.  Pt also told the nurse that he drank a 5th of vodka last night.     Past Medical History:  Diagnosis Date  . Anxiety   . Chest pain   . Coronary artery disease   . Diabetes mellitus   . Hyperlipidemia   . Hypertension   . Hypothyroidism   . Pulmonary nodules    resolved  . Stroke (cerebrum) (HCC)   . Stroke due to embolism of left carotid artery (HCC) 03/24/2016   Left caudate head stroke    Patient Active Problem List   Diagnosis Date Noted  . Intractable back pain 12/29/2017  . Chest pain 12/28/2017  . Abnormality of gait 03/24/2016  . Stroke due to embolism of left carotid artery (HCC) 03/24/2016  . HLD (hyperlipidemia) 05/22/2009  . Essential hypertension 05/22/2009  . CAD, NATIVE VESSEL 05/22/2009  . CHEST PAIN-UNSPECIFIED 05/22/2009    Past Surgical History:  Procedure Laterality Date  . BACK SURGERY  1996  . BREAST LUMPECTOMY Left 1979  . CIRCUMCISION  1980  . MANDIBLE SURGERY Right 1969        Home Medications    Prior to Admission medications   Medication Sig Start Date End Date Taking? Authorizing Provider  ACCU-CHEK AVIVA PLUS test strip  02/27/16   [provider]  ALPRAZolam Prudy Feeler) 1 MG tablet Take 1 mg by mouth 2 (two) times daily as needed. 01/18/16   [provider]  aspirin 81 MG tablet Take 81 mg by mouth daily.    [provider]  atorvastatin (LIPITOR) 20 MG tablet Take 20 mg by mouth daily.   01/16/16   [provider]  cyclobenzaprine (FLEXERIL) 5 MG tablet Take 1 tablet by mouth every 6 (six) hours as needed. 12/01/17   [provider]  Diclofenac Sodium 3 % GEL  03/16/16   [provider]  DULoxetine (CYMBALTA) 60 MG capsule Take 60 mg by mouth daily. 12/17/17   [provider]  gabapentin (NEURONTIN) 300 MG capsule Take 300 mg by mouth 3 (three) times daily. 12/17/17   [provider]  glimepiride (AMARYL) 2 MG tablet Take 2 mg by mouth daily.  03/16/16   [provider]  hydrochlorothiazide (HYDRODIURIL) 25 MG tablet Take 25 mg by mouth daily.    [provider]  HYDROcodone-acetaminophen (NORCO) 10-325 MG tablet Take 1 tablet by mouth every 6 (six) hours as needed.  02/27/16   [provider]  levothyroxine (SYNTHROID, LEVOTHROID) 100 MCG tablet Take 100 mcg by mouth daily. 10/22/17   [provider]  lisinopril (PRINIVIL,ZESTRIL) 2.5 MG tablet Take 2.5 mg by mouth daily. 12/16/17   [provider]  metFORMIN (GLUCOPHAGE) 500 MG tablet Take 500 mg by mouth 2 (two) times daily as needed.  01/16/16   [provider]  metoprolol (LOPRESSOR) 50 MG tablet Take 50  mg by mouth 2 (two) times daily.  01/16/16   [provider]  nitroGLYCERIN (NITROSTAT) 0.4 MG SL tablet Place 0.4 mg under the tongue every 5 (five) minutes as needed for chest pain.  03/08/16   [provider]  omega-3 acid ethyl esters (LOVAZA) 1 g capsule Take 2 capsules by mouth 2 (two) times daily. 12/17/17   [provider]  pantoprazole (PROTONIX) 40 MG tablet Take 40 mg by mouth daily. 12/17/17   [provider]    Family History Family History  Problem Relation Age of Onset  . Lung cancer Mother   . Diabetes Father   . Bladder Cancer Father   . Colon cancer Brother   . Stroke Maternal Grandmother   . Cancer Maternal Grandfather   . Stroke Paternal Grandmother   . Stroke Paternal Grandfather      Social History Social History   Tobacco Use  . Smoking status: Former Games developer  . Smokeless tobacco: Never Used  Substance Use Topics  . Alcohol use: No  . Drug use: No     Allergies   Morphine and Sulfonamide derivatives   Review of Systems Review of Systems  Respiratory: Positive for shortness of breath.   Cardiovascular: Positive for chest pain.     Physical Exam Updated Vital Signs BP 131/61   Pulse (!) 58   Temp (!) 97.5 F (36.4 C)   Resp 16   Ht 5\' 9"  (1.753 m)   Wt 79.4 kg (175 lb)   SpO2 99%   BMI 25.84 kg/m   Physical Exam  Constitutional: He is oriented to person, place, and time. He appears well-developed and well-nourished.  HENT:  Head: Normocephalic and atraumatic.  Eyes: Pupils are equal, round, and reactive to light. EOM are normal.  Neck: Normal range of motion. Neck supple.  Cardiovascular: Normal rate, regular rhythm, intact distal pulses and normal pulses.  Pulmonary/Chest: Effort normal and breath sounds normal.  Abdominal: Soft. Bowel sounds are normal.  Musculoskeletal: Normal range of motion.       Right lower leg: Normal.       Left lower leg: Normal.  Neurological: He is alert and oriented to person, place, and time.  Skin: Skin is warm. Capillary refill takes less than 2 seconds.  Psychiatric: He has a normal mood and affect. His behavior is normal.  Nursing note and vitals reviewed.    ED Treatments / Results  Labs (all labs ordered are listed, but only abnormal results are displayed) Labs Reviewed  BASIC METABOLIC PANEL - Abnormal; Notable for the following components:      Result Value   Glucose, Bld 172 (*)    Creatinine, Ser 1.43 (*)    GFR calc non Af Amer 48 (*)    GFR calc Af Amer 56 (*)    All other components within normal limits  CBC  TROPONIN I  LIPASE, BLOOD    EKG None EKG not coming through on MUSE.  HR 62.  No st or t wave changes.  No stemi.  Nl ekg.  Radiology Dg Chest 2 View  Result  Date: 03/07/2018 CLINICAL DATA:  72 year old male with chest pain. EXAM: CHEST - 2 VIEW COMPARISON:  Chest radiograph dated 03/05/2018 FINDINGS: The heart size and mediastinal contours are within normal limits. Both lungs are clear. The visualized skeletal structures are unremarkable. IMPRESSION: No active cardiopulmonary disease. Electronically Signed   By: Elgie Collard M.D.   On: 03/07/2018 23:16    Procedures  Procedures (including critical care time)  Medications Ordered in ED Medications  nitroGLYCERIN (NITROGLYN) 2 % ointment 1 inch (1 inch Topical Given 03/07/18 2232)  ondansetron (ZOFRAN) injection 4 mg (4 mg Intravenous Given 03/07/18 2318)  gi cocktail (Maalox,Lidocaine,Donnatal) (30 mLs Oral Given 03/07/18 2318)     Initial Impression / Assessment and Plan / ED Course  I have reviewed the triage vital signs and the nursing notes.  Pertinent labs & imaging results that were available during my care of the patient were reviewed by me and considered in my medical decision making (see chart for details).   Pt's cp description sounds very atypical especially in the setting of over drinking alcohol.  Pt does have risk factors, so I will keep him for one more troponin.  Pt signed out to Dr. Manus Gunningancour pending 2nd troponin.   Final Clinical Impressions(s) / ED Diagnoses   Final diagnoses:  Chest pain, unspecified type    ED Discharge Orders    None       Jacalyn LefevreHaviland, Oluwatamilore Starnes, MD 03/08/18 [phone]

## 2018-03-07 NOTE — ED Notes (Signed)
Patient transported to X-ray 

## 2018-03-07 NOTE — ED Triage Notes (Signed)
Pt c/o chest pain with sob. Pt state he took 2 nitro with no relief and 4 baby aspirin.

## 2018-03-08 ENCOUNTER — Encounter (HOSPITAL_COMMUNITY): Payer: Self-pay | Admitting: Physician Assistant

## 2018-03-08 ENCOUNTER — Observation Stay (HOSPITAL_COMMUNITY): Payer: Medicare Other

## 2018-03-08 DIAGNOSIS — R079 Chest pain, unspecified: Secondary | ICD-10-CM | POA: Diagnosis not present

## 2018-03-08 DIAGNOSIS — G8929 Other chronic pain: Secondary | ICD-10-CM | POA: Diagnosis present

## 2018-03-08 DIAGNOSIS — R1011 Right upper quadrant pain: Secondary | ICD-10-CM | POA: Diagnosis present

## 2018-03-08 DIAGNOSIS — M545 Low back pain: Secondary | ICD-10-CM

## 2018-03-08 DIAGNOSIS — I200 Unstable angina: Secondary | ICD-10-CM | POA: Diagnosis not present

## 2018-03-08 DIAGNOSIS — I2510 Atherosclerotic heart disease of native coronary artery without angina pectoris: Secondary | ICD-10-CM

## 2018-03-08 DIAGNOSIS — E039 Hypothyroidism, unspecified: Secondary | ICD-10-CM | POA: Diagnosis present

## 2018-03-08 DIAGNOSIS — E782 Mixed hyperlipidemia: Secondary | ICD-10-CM

## 2018-03-08 DIAGNOSIS — M549 Dorsalgia, unspecified: Secondary | ICD-10-CM

## 2018-03-08 DIAGNOSIS — N183 Chronic kidney disease, stage 3 unspecified: Secondary | ICD-10-CM

## 2018-03-08 DIAGNOSIS — R0789 Other chest pain: Secondary | ICD-10-CM

## 2018-03-08 DIAGNOSIS — F419 Anxiety disorder, unspecified: Secondary | ICD-10-CM | POA: Diagnosis present

## 2018-03-08 DIAGNOSIS — I10 Essential (primary) hypertension: Secondary | ICD-10-CM

## 2018-03-08 LAB — I-STAT TROPONIN, ED
Troponin i, poc: 0 ng/mL (ref 0.00–0.08)
Troponin i, poc: 0 ng/mL (ref 0.00–0.08)

## 2018-03-08 LAB — HEPATIC FUNCTION PANEL
ALT: 22 U/L (ref 0–44)
AST: 18 U/L (ref 15–41)
Albumin: 3.8 g/dL (ref 3.5–5.0)
Alkaline Phosphatase: 65 U/L (ref 38–126)
BILIRUBIN DIRECT: 0.1 mg/dL (ref 0.0–0.2)
BILIRUBIN INDIRECT: 0.7 mg/dL (ref 0.3–0.9)
BILIRUBIN TOTAL: 0.8 mg/dL (ref 0.3–1.2)
Total Protein: 6.9 g/dL (ref 6.5–8.1)

## 2018-03-08 LAB — GLUCOSE, CAPILLARY
GLUCOSE-CAPILLARY: 141 mg/dL — AB (ref 70–99)
GLUCOSE-CAPILLARY: 274 mg/dL — AB (ref 70–99)
Glucose-Capillary: 140 mg/dL — ABNORMAL HIGH (ref 70–99)

## 2018-03-08 LAB — TROPONIN I: Troponin I: <0.03 ng/mL (ref <0.03)

## 2018-03-08 MED ORDER — ALPRAZOLAM 1 MG PO TABS: 1.0000 mg | ORAL_TABLET | Freq: Two times a day (BID) | ORAL | Status: DC | PRN

## 2018-03-08 MED ORDER — ASPIRIN 81 MG PO CHEW
81.0000 mg | CHEWABLE_TABLET | Freq: Every day | ORAL | Status: DC
  Administered 2018-03-08: 81 mg via ORAL
  Filled 2018-03-08: qty 1

## 2018-03-08 MED ORDER — OMEGA-3-ACID ETHYL ESTERS 1 G PO CAPS: 2.0000 | ORAL_CAPSULE | Freq: Two times a day (BID) | ORAL | Status: DC

## 2018-03-08 MED ORDER — SODIUM CHLORIDE 0.9 % IV SOLN
INTRAVENOUS | Status: AC
  Administered 2018-03-08: 04:00:00 via INTRAVENOUS

## 2018-03-08 MED ORDER — GABAPENTIN 300 MG PO CAPS
300.0000 mg | ORAL_CAPSULE | Freq: Three times a day (TID) | ORAL | Status: DC
  Administered 2018-03-08: 300 mg via ORAL
  Filled 2018-03-08: qty 1

## 2018-03-08 MED ORDER — PANTOPRAZOLE SODIUM 40 MG PO TBEC
40.0000 mg | DELAYED_RELEASE_TABLET | Freq: Every day | ORAL | Status: DC
  Administered 2018-03-08: 40 mg via ORAL
  Filled 2018-03-08: qty 1

## 2018-03-08 MED ORDER — METFORMIN HCL 500 MG PO TABS: 500.0000 mg | ORAL_TABLET | Freq: Two times a day (BID) | ORAL | Status: DC

## 2018-03-08 MED ORDER — LEVOTHYROXINE SODIUM 100 MCG PO TABS
100.0000 ug | ORAL_TABLET | Freq: Every day | ORAL | Status: DC
  Administered 2018-03-08: 100 ug via ORAL
  Filled 2018-03-08: qty 1

## 2018-03-08 MED ORDER — HYDROCHLOROTHIAZIDE 25 MG PO TABS
25.0000 mg | ORAL_TABLET | Freq: Every day | ORAL | Status: DC
  Administered 2018-03-08: 25 mg via ORAL
  Filled 2018-03-08: qty 1

## 2018-03-08 MED ORDER — OXYCODONE HCL 5 MG PO TABS
5.0000 mg | ORAL_TABLET | Freq: Three times a day (TID) | ORAL | Status: DC | PRN
  Administered 2018-03-08: 5 mg via ORAL
  Filled 2018-03-08: qty 1

## 2018-03-08 MED ORDER — HYDROCODONE-ACETAMINOPHEN 10-325 MG PO TABS: 1.0000 | ORAL_TABLET | Freq: Four times a day (QID) | ORAL | Status: DC | PRN

## 2018-03-08 MED ORDER — CYCLOBENZAPRINE HCL 10 MG PO TABS: 5.0000 mg | ORAL_TABLET | Freq: Four times a day (QID) | ORAL | Status: DC | PRN

## 2018-03-08 MED ORDER — ONDANSETRON HCL 4 MG/2ML IJ SOLN
4.0000 mg | Freq: Four times a day (QID) | INTRAMUSCULAR | Status: DC | PRN
  Administered 2018-03-08: 4 mg via INTRAVENOUS
  Filled 2018-03-08: qty 2

## 2018-03-08 MED ORDER — ENOXAPARIN SODIUM 40 MG/0.4ML ~~LOC~~ SOLN: 40.0000 mg | SUBCUTANEOUS | Status: DC

## 2018-03-08 MED ORDER — NITROGLYCERIN 0.4 MG SL SUBL: 0.4000 mg | SUBLINGUAL_TABLET | SUBLINGUAL | Status: DC | PRN

## 2018-03-08 MED ORDER — PANTOPRAZOLE SODIUM 40 MG PO TBEC: 40.0000 mg | DELAYED_RELEASE_TABLET | Freq: Every day | ORAL | 1 refills | Status: DC

## 2018-03-08 MED ORDER — ACETAMINOPHEN 325 MG PO TABS: 650.0000 mg | ORAL_TABLET | ORAL | Status: DC | PRN

## 2018-03-08 MED ORDER — GLIMEPIRIDE 2 MG PO TABS
2.0000 mg | ORAL_TABLET | Freq: Every day | ORAL | Status: DC
  Administered 2018-03-08: 2 mg via ORAL
  Filled 2018-03-08: qty 1

## 2018-03-08 MED ORDER — DULOXETINE HCL 60 MG PO CPEP
60.0000 mg | ORAL_CAPSULE | Freq: Every day | ORAL | Status: DC
  Administered 2018-03-08: 60 mg via ORAL
  Filled 2018-03-08: qty 1

## 2018-03-08 MED ORDER — ATORVASTATIN CALCIUM 20 MG PO TABS
20.0000 mg | ORAL_TABLET | Freq: Every day | ORAL | Status: DC
  Administered 2018-03-08: 20 mg via ORAL
  Filled 2018-03-08: qty 1

## 2018-03-08 MED ORDER — LISINOPRIL 5 MG PO TABS
2.5000 mg | ORAL_TABLET | Freq: Every day | ORAL | Status: DC
  Administered 2018-03-08: 2.5 mg via ORAL
  Filled 2018-03-08: qty 1

## 2018-03-08 MED ORDER — ENOXAPARIN SODIUM 40 MG/0.4ML ~~LOC~~ SOLN
40.0000 mg | SUBCUTANEOUS | Status: DC
  Administered 2018-03-08: 40 mg via SUBCUTANEOUS
  Filled 2018-03-08: qty 0.4

## 2018-03-08 MED ORDER — INSULIN ASPART 100 UNIT/ML ~~LOC~~ SOLN
0.0000 [IU] | Freq: Three times a day (TID) | SUBCUTANEOUS | Status: DC
  Administered 2018-03-08: 1 [IU] via SUBCUTANEOUS
  Administered 2018-03-08: 5 [IU] via SUBCUTANEOUS

## 2018-03-08 MED ORDER — ACETAMINOPHEN 325 MG PO TABS
650.0000 mg | ORAL_TABLET | Freq: Once | ORAL | Status: AC
  Administered 2018-03-08: 650 mg via ORAL
  Filled 2018-03-08: qty 2

## 2018-03-08 MED ORDER — OXYCODONE-ACETAMINOPHEN 5-325 MG PO TABS
1.0000 | ORAL_TABLET | Freq: Three times a day (TID) | ORAL | Status: DC | PRN
  Administered 2018-03-08: 1 via ORAL
  Filled 2018-03-08: qty 1

## 2018-03-08 NOTE — Progress Notes (Signed)
Patient discharged home.  IV removed - WNL.  Reviewed AVS and medications with patient and family.  Verbalized understanding.  Instructed to follow up with PCP - apt already in place for next week, offica called and also made aware of need of hospital follow up.  Patient assisted off unit in NAD

## 2018-03-08 NOTE — Care Management Obs Status (Signed)
MEDICARE OBSERVATION STATUS NOTIFICATION   Patient Details  Name: [name] MRN: [id number] Date of Birth: [birth year]   Medicare Observation Status Notification Given:  Yes    Malcolm MetroChildress, Lexxus Underhill Demske, RN 03/08/2018, 10:45 AM

## 2018-03-08 NOTE — H&P (Signed)
History and Physical    [name]:[id number] DOB: 11-16-1945 DOA: 03/07/2018  PCP: Benita Stabile, MD   Patient coming from: Home.  I have personally briefly reviewed patient's old medical records in [facility]  Chief Complaint: Chest pain.  HPI: [name] is a 72 y.o. male with medical history significant of anxiety, chest pain, CAD, type 2 diabetes, hyperlipidemia, hypertension, hypothyroidism, resolved pulmonary nodules, history of CVA due to left carotid artery embolism who is coming to the emergency department due to chest pain.  Per patient, yesterday evening after 1700, while he was watching TV he developed sudden shortness of breath associated with mild nausea, mild diaphoresis followed by a non-radiated, sharp chest pressure while he was coming to the hospital.  He took 324 mg of aspirin and 2 sublingual nitroglycerin, which provided very little relief.  There were no worsening or relieving factors.  The patient described the pain similar to the one he had with his "heart attack" 11 years ago.  Complains of bitemporal headache.  Denies fever, chills, sore throat, wheezing, hemoptysis, PND, orthopnea or pitting edema lower extremities.  He has been having RUQ tenderness for the past week, which is similar to the pain he had 12 years ago, when he had a small urolith in the right kidney.  No emesis, diarrhea, constipation, melena or hematochezia.  No dysuria, frequency or hematuria.  No heat or cold intolerance.  No polyuria, polydipsia, or blurred vision.  ED Course: Initial vital signs 97.5 F, pulse 116, respirations 18, blood pressure 89/55 mmHg and O2 sat 100% on room air.  The patient received GI cocktail, Zofran 4 mg IVP x1 and Nitropaste.  He was chest pain-free when evaluated.  He has had a normal EKG, normal troponin so far.  Normal CBC.  Normal lipase.  Glucose was 172 and creatinine 1.43 mg/dL.  All other BMP values were normal.  Chest radiograph did  not have any acute cardiopulmonary disease.  Review of Systems: As per HPI otherwise 10 point review of systems negative.    Past Medical History:  Diagnosis Date  . Anxiety   . Chest pain   . Coronary artery disease   . Diabetes mellitus   . Hyperlipidemia   . Hypertension   . Hypothyroidism   . Pulmonary nodules    resolved  . Stroke (cerebrum) (HCC)   . Stroke due to embolism of left carotid artery (HCC) 03/24/2016   Left caudate head stroke    Past Surgical History:  Procedure Laterality Date  . BACK SURGERY  1996  . BREAST LUMPECTOMY Left 1979  . CIRCUMCISION  1980  . MANDIBLE SURGERY Right 1969     reports that he has quit smoking. He has never used smokeless tobacco. He reports that he does not drink alcohol or use drugs.  Allergies  Allergen Reactions  . Morphine Itching and Rash  . Sulfonamide Derivatives     REACTION: Rash    Family History  Problem Relation Age of Onset  . Lung cancer Mother   . Diabetes Father   . Bladder Cancer Father   . Colon cancer Brother   . Stroke Maternal Grandmother   . Cancer Maternal Grandfather   . Stroke Paternal Grandmother   . Stroke Paternal Grandfather     Prior to Admission medications   Medication Sig Start Date End Date Taking? Authorizing Provider  ACCU-CHEK AVIVA PLUS test strip  02/27/16   [provider]  ALPRAZolam Prudy Feeler) 1  MG tablet Take 1 mg by mouth 2 (two) times daily as needed. 01/18/16   [provider]  aspirin 81 MG tablet Take 81 mg by mouth daily.    [provider]  atorvastatin (LIPITOR) 20 MG tablet Take 20 mg by mouth daily.  01/16/16   [provider]  cyclobenzaprine (FLEXERIL) 5 MG tablet Take 1 tablet by mouth every 6 (six) hours as needed. 12/01/17   [provider]  Diclofenac Sodium 3 % GEL  03/16/16   [provider]  DULoxetine (CYMBALTA) 60 MG capsule Take 60 mg by mouth daily. 12/17/17   [provider]  gabapentin (NEURONTIN)  300 MG capsule Take 300 mg by mouth 3 (three) times daily. 12/17/17   [provider]  glimepiride (AMARYL) 2 MG tablet Take 2 mg by mouth daily.  03/16/16   [provider]  hydrochlorothiazide (HYDRODIURIL) 25 MG tablet Take 25 mg by mouth daily.    [provider]  HYDROcodone-acetaminophen (NORCO) 10-325 MG tablet Take 1 tablet by mouth every 6 (six) hours as needed.  02/27/16   [provider]  levothyroxine (SYNTHROID, LEVOTHROID) 100 MCG tablet Take 100 mcg by mouth daily. 10/22/17   [provider]  lisinopril (PRINIVIL,ZESTRIL) 2.5 MG tablet Take 2.5 mg by mouth daily. 12/16/17   [provider]  metFORMIN (GLUCOPHAGE) 500 MG tablet Take 500 mg by mouth 2 (two) times daily as needed.  01/16/16   [provider]  metoprolol (LOPRESSOR) 50 MG tablet Take 50 mg by mouth 2 (two) times daily.  01/16/16   [provider]  nitroGLYCERIN (NITROSTAT) 0.4 MG SL tablet Place 0.4 mg under the tongue every 5 (five) minutes as needed for chest pain.  03/08/16   [provider]  omega-3 acid ethyl esters (LOVAZA) 1 g capsule Take 2 capsules by mouth 2 (two) times daily. 12/17/17   [provider]  pantoprazole (PROTONIX) 40 MG tablet Take 40 mg by mouth daily. 12/17/17   [provider]    Physical Exam: Vitals:   03/08/18 0030 03/08/18 0130 03/08/18 0200 03/08/18 0230  BP: 123/61 130/62 (!) 142/69 (!) 145/75  Pulse: 60 60 62 (!) 59  Resp: 14 17 19 18   Temp:      SpO2: 99% 100% 100% 98%  Weight:      Height:        Constitutional: NAD, calm, comfortable Eyes: PERRL, lids and conjunctivae normal ENMT: Mucous membranes are moist. Posterior pharynx clear of any exudate or lesions. Neck: normal, supple, no masses, no thyromegaly Respiratory: clear to auscultation bilaterally, no wheezing, no crackles. Normal respiratory effort. No accessory muscle use.  Cardiovascular: Regular rate and rhythm, no murmurs / rubs  / gallops. No extremity edema. 2+ pedal pulses. No carotid bruits.  Abdomen: Nondistended, soft, positive RUQ tenderness, no masses palpated. No hepatosplenomegaly. Bowel sounds positive.  Musculoskeletal: no clubbing / cyanosis. Good ROM, no contractures. Normal muscle tone.  Skin: no rashes, lesions, ulcers on limited dermatological exam. Neurologic: CN 2-12 grossly intact. Sensation intact, DTR normal. Strength 5/5 in all 4.  Psychiatric: Normal judgment and insight. Alert and oriented x 3. Normal mood.    Labs on Admission: I have personally reviewed following labs and imaging studies  CBC: Recent Labs  Lab 03/07/18 2230  WBC 9.3  HGB 14.6  HCT 43.5  MCV 91.2  PLT 209   Basic Metabolic Panel: Recent Labs  Lab 03/07/18 2230  NA 136  K 4.4  CL  103  CO2 24  GLUCOSE 172*  BUN 19  CREATININE 1.43*  CALCIUM 9.2   GFR: Estimated Creatinine Clearance: 48.1 mL/min (A) (by C-G formula based on SCr of 1.43 mg/dL (H)). Liver Function Tests: No results for input(s): AST, ALT, ALKPHOS, BILITOT, PROT, ALBUMIN in the last 168 hours. Recent Labs  Lab 03/07/18 2230  LIPASE 33   No results for input(s): AMMONIA in the last 168 hours. Coagulation Profile: No results for input(s): INR, PROTIME in the last 168 hours. Cardiac Enzymes: Recent Labs  Lab 03/07/18 2230  TROPONINI <0.03   BNP (last 3 results) No results for input(s): PROBNP in the last 8760 hours. HbA1C: No results for input(s): HGBA1C in the last 72 hours. CBG: No results for input(s): GLUCAP in the last 168 hours. Lipid Profile: No results for input(s): CHOL, HDL, LDLCALC, TRIG, CHOLHDL, LDLDIRECT in the last 72 hours. Thyroid Function Tests: No results for input(s): TSH, T4TOTAL, FREET4, T3FREE, THYROIDAB in the last 72 hours. Anemia Panel: No results for input(s): VITAMINB12, FOLATE, FERRITIN, TIBC, IRON, RETICCTPCT in the last 72 hours. Urine analysis: No results found for: COLORURINE, APPEARANCEUR,  LABSPEC, PHURINE, GLUCOSEU, HGBUR, BILIRUBINUR, KETONESUR, PROTEINUR, UROBILINOGEN, NITRITE, LEUKOCYTESUR  Radiological Exams on Admission: Dg Chest 2 View  Result Date: 03/07/2018 CLINICAL DATA:  72 year old male with chest pain. EXAM: CHEST - 2 VIEW COMPARISON:  Chest radiograph dated 03/05/2018 FINDINGS: The heart size and mediastinal contours are within normal limits. Both lungs are clear. The visualized skeletal structures are unremarkable. IMPRESSION: No active cardiopulmonary disease. Electronically Signed   By: Elgie Collard M.D.   On: 03/07/2018 23:16   12/29/2017 Echo ------------------------------------------------------------------- LV EF: 50% -   55%  ------------------------------------------------------------------- Indications:      Abnormal EKG 794.31.  ------------------------------------------------------------------- History:   PMH:  Former Smoker.  Coronary artery disease.  Angina pectoris.  Stroke.  Risk factors:  Hypertension. Dyslipidemia.  ------------------------------------------------------------------- Study Conclusions  - Left ventricle: The cavity size was normal. Wall thickness was   normal. Systolic function was normal. The estimated ejection   fraction was in the range of 50% to 55%. Wall motion was normal;   there were no regional wall motion abnormalities. Doppler   parameters are consistent with abnormal left ventricular   relaxation (grade 1 diastolic dysfunction). - Aortic valve: There was mild regurgitation. Valve area (VTI):   2.42 cm^2. Valve area (Vmax): 2.32 cm^2. - Left atrium: The atrium was moderately dilated. - Technically adequate study  EKG: Independently reviewed.  Vent. rate 62 BPM PR interval 202 ms QRS duration 94 ms QT/QTc 416/422 ms P-R-T axes 44 80 52 Normal sinus rhythm Normal ECG  Assessment/Plan Principal Problem:   Unstable angina (HCC) Heart score of at least 4. Multiple risk factors for CAD. The  patient was supposed to have a stress test, but has not followed up with cardiology. I will consult cardiology today so they can arrange for stress testing.  Active Problems:   CAD, NATIVE VESSEL As above. Continue aspirin, atorvastatin and metoprolol.    HLD (hyperlipidemia)  Continue atorvastatin 20 mg p.o. daily. Check LFTs.    Essential hypertension Continue hydrochlorothiazide 25 mg p.o. daily. Continue lisinopril 2.5 mg p.o. daily. Continue metoprolol 50 mg p.o. twice daily. Monitor blood pressure, heart rate, renal function electrolytes.    Hypothyroidism Continue levothyroxine 100 mcg p.o. daily. Check TSH as needed.      Anxiety Continue alprazolam.    Chronic back pain Continue cyclobenzaprine and Norco.    RUQ pain  Check RUQ ultrasound. Check LFTs.    DVT prophylaxis: Lovenox SQ. Code Status: Full code. Family Communication:  Disposition Plan: Observation for troponin level trending and cardiology evaluation. Consults called: Routine cardiology consult. Admission status: Observation/telemetry.   Bobette Moavid Manuel Tyra Gural MD [facility] Pager [phone].  If 7PM-7AM, please contact night-coverage [email or web address] Password [email or web address]  03/08/2018, 2:53 AM

## 2018-03-08 NOTE — Discharge Summary (Signed)
Physician Discharge Summary  [name]:[id number] DOB: 1946-02-01 DOA: 03/07/2018  PCP: Benita Stabile, MD  Admit date: 03/07/2018 Discharge date: 03/08/2018  Admitted From: Home Disposition:  Home   Recommendations for Outpatient Follow-up:  1. Follow up with PCP in 1-2 weeks 2. Please obtain BMP/CBC in one week    Discharge Condition: Stable CODE STATUS: FULL Diet recommendation: Heart Healthy / Carb Modified    Brief/Interim Summary: 72 y.o. male with medical history significant of anxiety, chest pain, CAD, type 2 diabetes, hyperlipidemia, hypertension, hypothyroidism, resolved pulmonary nodules, history of CVA due to left carotid artery embolism who presented to the emergency department due to chest pain.  Patient stated that his chest pain developed while watching television with associated shortness of breath and nausea.  He stated that he was sharp in nature without any radiation.  He took aspirin and 2 sublingual nitroglycerin without relief. The patient described the pain similar to the one he had with his "heart attack" 11 years ago.  Apparently, the patient takes approximately 3-4 ibuprofen every week.  In addition, the patient drank 1/5 of vodka on 03/05/2018.  Patient has been complaining of intermittent epigastric and right upper quadrant discomfort for the past week without any emesis.  He denies any diarrhea, hematochezia, melena.  He denied any other alcohol besides his vodka.  He has a 90-pack-year history of tobacco.  In the emergency department, the patient was afebrile and hemodynamically stable.  EKG shows normal sinus rhythm without any ST or T wave changes.  Lipase was 33 with normal LFTs.  Troponins were negative x2.  The patient was given a GI cocktail with relief of his chest discomfort.  Cardiology was consulted to see the patient.  Did not feel the patient needed any further work-up from a cardiac standpoint.  Right upper quadrant ultrasound was obtained  and was unremarkable.  The patient was placed on a cardiac diet which she tolerated without difficulty.  The patient was advised to start Protonix on a daily basis.  He was advised to follow-up with his PCP if his nausea and epigastric discomfort persisted with protonix.  He was admitted with chest pain in 12/2017 and ruled out for MI. Symptoms improved with Tums, Pepcid, 3 SL NTG. 2D echo 12/29/17 showed EF 50-55%, grade 1 DD, mild AI, mod LAE. OP cardiac f/u was recommended.    Discharge Diagnoses:  Atypical chest pain -likely GI etiology -complete relief with GI cocktail -troponins neg x 2 -01/08/2018 echo EF 50-55%, grade 1 DD, trivial TR -Appreciate cardiology consult--> no further work-up from a cardiac standpoint  Coronary Artery Disease -continue ASA, lipitor, metoprolol -exercise Cardiolite stress test negative for ischemia with ejection fraction 61% October 2014 -several heart caths for chest pain including 2005, 2009, 2010 - last one in 2010 showed 30% prox-mid LAD, 20% prox RCA, 20% dRCA, EF 55%    HLD (hyperlipidemia)  Continue atorvastatin 20 mg p.o. daily. Check LFTs.--normal    Essential hypertension Continue hydrochlorothiazide 25 mg p.o. daily. Continue lisinopril 2.5 mg p.o. daily. Continue metoprolol 50 mg p.o. twice daily.     Hypothyroidism Continue levothyroxine 100 mcg p.o. daily.      Anxiety Continue alprazolam home dose.    Chronic back pain Continue cyclobenzaprine and home dose percocet. Continue gabapentin    RUQ pain/epigastric Check RUQ ultrasound--negative. Check LFTs--normal -likely due to gastritis from NSAIDS/Etoh  Diabetes mellitus type 2 -Restart metformin and Amaryl -12/29/17 hemoglobin A 1C--6.2  Chronic Back Pain/Spinal stenosis -  continue home dose percocet [facility] Controlled Substance Reporting System has been queried for this patient--no red flags -noted on MRI L-spine 12/29/17  CKD 3 -baseline creatinine  1.0-1.3  Discharge Instructions   Allergies as of 03/08/2018      Reactions   Morphine Itching, Rash   Sulfonamide Derivatives    REACTION: Rash      Medication List    TAKE these medications   ACCU-CHEK AVIVA PLUS test strip Generic drug:  glucose blood   ALPRAZolam 1 MG tablet Commonly known as:  XANAX Take 1 mg by mouth 2 (two) times daily as needed.   aspirin 81 MG tablet Take 81 mg by mouth daily.   atorvastatin 20 MG tablet Commonly known as:  LIPITOR Take 20 mg by mouth daily.   cyclobenzaprine 5 MG tablet Commonly known as:  FLEXERIL Take 5 mg by mouth 3 (three) times daily as needed for muscle spasms.   DULoxetine 60 MG capsule Commonly known as:  CYMBALTA Take 60 mg by mouth daily.   gabapentin 300 MG capsule Commonly known as:  NEURONTIN Take 300 mg by mouth 3 (three) times daily.   glimepiride 2 MG tablet Commonly known as:  AMARYL Take 2 mg by mouth daily.   hydrochlorothiazide 25 MG tablet Commonly known as:  HYDRODIURIL Take 25 mg by mouth daily.   levothyroxine 100 MCG tablet Commonly known as:  SYNTHROID, LEVOTHROID Take 100 mcg by mouth daily.   lisinopril 2.5 MG tablet Commonly known as:  PRINIVIL,ZESTRIL Take 2.5 mg by mouth daily.   metFORMIN 500 MG tablet Commonly known as:  GLUCOPHAGE Take 500 mg by mouth 2 (two) times daily as needed.   metoprolol tartrate 50 MG tablet Commonly known as:  LOPRESSOR Take 50 mg by mouth 2 (two) times daily.   nitroGLYCERIN 0.4 MG SL tablet Commonly known as:  NITROSTAT Place 0.4 mg under the tongue every 5 (five) minutes as needed for chest pain.   omega-3 acid ethyl esters 1 g capsule Commonly known as:  LOVAZA Take 2 capsules by mouth 2 (two) times daily.   oxyCODONE-acetaminophen 10-325 MG tablet Commonly known as:  PERCOCET TAKE 1 TABLET BY MOUTH THREE TIMES DAILY AS NEEDED FOR SEVERE PAIN   pantoprazole 40 MG tablet Commonly known as:  PROTONIX Take 1 tablet (40 mg total) by  mouth daily.       Allergies  Allergen Reactions  . Morphine Itching and Rash  . Sulfonamide Derivatives     REACTION: Rash    Consultations:  cardiology   Procedures/Studies: Dg Chest 2 View  Result Date: 03/07/2018 CLINICAL DATA:  72 year old male with chest pain. EXAM: CHEST - 2 VIEW COMPARISON:  Chest radiograph dated 03/05/2018 FINDINGS: The heart size and mediastinal contours are within normal limits. Both lungs are clear. The visualized skeletal structures are unremarkable. IMPRESSION: No active cardiopulmonary disease. Electronically Signed   By: Elgie Collard M.D.   On: 03/07/2018 23:16   US Abdomen Limited Ruq  Result Date: 03/08/2018 CLINICAL DATA:  Right upper quadrant pain EXAM: ULTRASOUND ABDOMEN LIMITED RIGHT UPPER QUADRANT COMPARISON:  CT 10/25/2016 FINDINGS: Gallbladder: No gallstones or wall thickening visualized. No sonographic Murphy sign noted by sonographer. Common bile duct: Diameter: Normal caliber, 3 mm Liver: No focal lesion identified. Within normal limits in parenchymal echogenicity. Portal vein is patent on color Doppler imaging with normal direction of blood flow towards the liver. IMPRESSION: Unremarkable right upper quadrant ultrasound. Electronically Signed   By: Charlett Nose  M.D.   On: 03/08/2018 11:03        Discharge Exam: Vitals:   03/08/18 0402 03/08/18 1310  BP: 123/64 134/81  Pulse: (!) 59 90  Resp: 18 19  Temp: 98.3 F (36.8 C) 98.6 F (37 C)  SpO2: 98% 97%   Vitals:   03/08/18 0230 03/08/18 0300 03/08/18 0402 03/08/18 1310  BP: (!) 145/75 (!) 145/71 123/64 134/81  Pulse: (!) 59 65 (!) 59 90  Resp: 18 17 18 19   Temp:   98.3 F (36.8 C) 98.6 F (37 C)  TempSrc:   Oral Oral  SpO2: 98% 97% 98% 97%  Weight:      Height:        General: Pt is alert, awake, not in acute distress Cardiovascular: RRR, S1/S2 +, no rubs, no gallops Respiratory: CTA bilaterally, no wheezing, no rhonchi Abdominal: Soft, NT, ND, bowel sounds  + Extremities: no edema, no cyanosis   The results of significant diagnostics from this hospitalization (including imaging, microbiology, ancillary and laboratory) are listed below for reference.    Significant Diagnostic Studies: Dg Chest 2 View  Result Date: 03/07/2018 CLINICAL DATA:  72 year old male with chest pain. EXAM: CHEST - 2 VIEW COMPARISON:  Chest radiograph dated 03/05/2018 FINDINGS: The heart size and mediastinal contours are within normal limits. Both lungs are clear. The visualized skeletal structures are unremarkable. IMPRESSION: No active cardiopulmonary disease. Electronically Signed   By: Elgie CollardArash  Radparvar M.D.   On: 03/07/2018 23:16   [place] Abdomen Limited Ruq  Result Date: 03/08/2018 CLINICAL DATA:  Right upper quadrant pain EXAM: ULTRASOUND ABDOMEN LIMITED RIGHT UPPER QUADRANT COMPARISON:  CT 10/25/2016 FINDINGS: Gallbladder: No gallstones or wall thickening visualized. No sonographic Murphy sign noted by sonographer. Common bile duct: Diameter: Normal caliber, 3 mm Liver: No focal lesion identified. Within normal limits in parenchymal echogenicity. Portal vein is patent on color Doppler imaging with normal direction of blood flow towards the liver. IMPRESSION: Unremarkable right upper quadrant ultrasound. Electronically Signed   By: Charlett NoseKevin  Dover M.D.   On: 03/08/2018 11:03     Microbiology: No results found for this or any previous visit (from the past 240 hour(s)).   Labs: Basic Metabolic Panel: Recent Labs  Lab 03/07/18 2230  NA 136  K 4.4  CL 103  CO2 24  GLUCOSE 172*  BUN 19  CREATININE 1.43*  CALCIUM 9.2   Liver Function Tests: Recent Labs  Lab 03/08/18 0756  AST 18  ALT 22  ALKPHOS 65  BILITOT 0.8  PROT 6.9  ALBUMIN 3.8   Recent Labs  Lab 03/07/18 2230  LIPASE 33   No results for input(s): AMMONIA in the last 168 hours. CBC: Recent Labs  Lab 03/07/18 2230  WBC 9.3  HGB 14.6  HCT 43.5  MCV 91.2  PLT 209   Cardiac Enzymes: Recent  Labs  Lab 03/07/18 2230 03/08/18 0756 03/08/18 1353  TROPONINI <0.03 <0.03 <0.03   BNP: Invalid input(s): POCBNP CBG: Recent Labs  Lab 03/08/18 0405 03/08/18 0809 03/08/18 1130  GLUCAP 141* 140* 274*    Time coordinating discharge:  36 minutes  Signed:  Catarina Hartshornavid Jhoan Schmieder, DO [facility] Pager: [phone] 03/08/2018, 2:38 PM

## 2018-03-08 NOTE — ED Provider Notes (Signed)
Care assumed from Dr Particia NearingHaviland  Patient with episode of shortness of breath followed by chest pain associated with nausea and diaphoresis similar to previous MI.  Chest pain-free now after nitroglycerin and aspirin.  EKG is reassuring.  Troponin is negative x1.  Chest pain has recurred in the ED.  With his risk factors, patient agreeable to overnight observation. D/w Dr. Robb Matarrtiz.   Glynn Octaveancour, Jenaro Souder, MD 03/08/18 [id number]

## 2018-03-08 NOTE — Consult Note (Addendum)
Cardiology Consultation:   Patient ID: [name]; [id number]; [birth year]   Admit date: 03/07/2018 Date of Consult: 03/08/2018  Primary Care Provider: Benita Stabile, MD Primary Cardiologist: No primary care provider on file. -> new to Dr. Wyline Mood  Chief Complaint: chest pain, acute on chronic RUQ pain, increased stress (drank fifth of Vodka prior to admission)  Patient Profile:   [name] is a 72 y.o. male with a hx of nonobstructive CAD by cath 2010 with negative nuclear stress test 05/2013, depression, recent alcohol excess, HTN, pancreatitis, HLD, possible horseshoe kidney, DM, lung nodule with prior negative PET, bovine arch, small pericardial effusion 2014, PVCs, PAT, WCT (outlined below), mild AI, mild carotid plaque by duplex 2017, mild dementia per [name], stroke (per PMH, due to embolism of L carotid artery 2017 - cared at [facility] per pt) who is being seen today for the evaluation of chest pain at the request of Dr. Arbutus Leas.  History of Present Illness:   He reports having had a "heart attack" 11 years ago. He has had several heart caths for chest pain including 2005, 2009, 2010 - last one in 2010 showed 30% prox-mid LAD, 20% prox RCA, 20% dRCA, EF 55%.  Per [name] notes, he's also been evaluated for syncope with the following workup: -echocardiogram October 2014 ejection fraction 50-55%, G1DD, mild left atrial margin, 1+ AR, 1+ TR, no significant pericardial effusion -Echocardiogram interpreted by Dr. Loletha Carrow [facility] office), September 2014, preserved ejection fraction, small pericardial effusion, 1+ MR, 2+ AR  -Event monitor September 2014 sinus rhythm, occasional PVCs, nonsustained PAT, and one 7 beat run of a wide QRS complex at 110-120 bpm, with minimal changes QRS axis, and QRS morphology not similar to documented PVCs.  -exercise Cardiolite stress test negative for ischemia with ejection fraction 61% October 2014 - prior notes indicate patient with significant  depression during an OV  He was admitted with chest pain in 12/2017 and ruled out for MI. Symptoms improved with Tums, Pepcid, 3 SL NTG. 2D echo 12/29/17 showed EF 50-55%, grade 1 DD, mild AI, mod LAE. OP cardiac f/u was recommended.  On the day of admission this time he had spontaneous onset of SOB while at rest. It was unchanged with walking around, resting, lying down. He took SL NTG x 2 and 4 baby ASA without relief, and came to the hospital. On the way here developed constant sharp substernal discomfort, with mild nausea. He also had chills and a headache. He received GI cocktail and felt that helped some. Pain lasted all night long and spontaneously eased around 5am. He has not had it since, except has mild discomfort when inhaling abruptly. No recent travel, surgery, bedrest. He is not tachycardic, tachypneic or hypoxic. Labs show neg troponin x 4, Cr 1.43, LFTs ok, glucose 172, CBC wnl, CXR NAD. Initial vitals showed HR 116 and BP 89/55, with subsequent value 62 and BP 147/70. ED notes do not report any information on these initial vitals, and IM note comments "The patient received GI cocktail, Zofran 4 mg IVP x1 and Nitropaste" after mentioning the vitals but does not comment, so I question whether these were entered in wrong chart. Abd [place] also ordered for RUQ pain. Pt reports remote injury 19 years ago that "gives him trouble every now and then." No longer SOB, no chest pain at present time. Reports +++ stress at home. [name] at bedside.  Past Medical History:  Diagnosis Date  . Alcohol use   . Anxiety   .  Chest pain   . Coronary artery disease    a. Nonobstructive CAD by cath 2010 with negative nuclear stress test 05/2013.  . Depression   . Diabetes mellitus   . Hyperlipidemia   . Hypertension   . Hypothyroidism   . Kidney problem    a. 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  . Mild aortic insufficiency   . PAT (paroxysmal atrial tachycardia) (HCC)   . Pericardial effusion    a.  by echo 2014.  . Pulmonary nodules    resolved  . PVC's (premature ventricular contractions)   . Stroke (cerebrum) (HCC)   . 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 Wide-complex tachycardia (HCC)    a. per [name] note: Event monitor September 2014 sinus rhythm, occasional PVCs, nonsustained PAT, and one 7 beat run of a wide QRS complex at 110-120 bpm, with minimal changes QRS axis, and QRS morphology not similar to documented PVCs.     Past Surgical History:  Procedure Laterality Date  . BACK SURGERY  1996  . BREAST LUMPECTOMY Left 1979  . CIRCUMCISION  1980  . MANDIBLE SURGERY Right 1969     Inpatient Medications: Scheduled Meds: . enoxaparin (LOVENOX) injection  40 mg Subcutaneous Q24H  . insulin aspart  0-9 Units Subcutaneous TID WC   Continuous Infusions: . sodium chloride 100 mL/hr at 03/08/18 0406   PRN Meds: acetaminophen, ondansetron (ZOFRAN) IV  Home Meds: Prior to Admission medications   Medication Sig Start Date End Date Taking? Authorizing Provider  ACCU-CHEK AVIVA PLUS test strip  02/27/16  Yes [provider]  ALPRAZolam Prudy Feeler) 1 MG tablet Take 1 mg by mouth 2 (two) times daily as needed. 01/18/16  Yes [provider]  aspirin 81 MG tablet Take 81 mg by mouth daily.   Yes [provider]  atorvastatin (LIPITOR) 20 MG tablet Take 20 mg by mouth daily.  01/16/16  Yes [provider]  cyclobenzaprine (FLEXERIL) 5 MG tablet Take 1 tablet by mouth every 6 (six) hours as needed. 12/01/17  Yes [provider]  Diclofenac Sodium 3 % GEL  03/16/16  Yes [provider]  DULoxetine (CYMBALTA) 60 MG capsule Take 60 mg by mouth daily. 12/17/17  Yes [provider]  gabapentin (NEURONTIN) 300 MG capsule Take 300 mg by mouth 3 (three) times daily. 12/17/17  Yes [provider]  glimepiride (AMARYL) 2 MG tablet Take 2 mg by mouth daily.  03/16/16  Yes [provider]  hydrochlorothiazide (HYDRODIURIL) 25 MG tablet Take 25 mg by mouth daily.   Yes [provider]  HYDROcodone-acetaminophen (NORCO) 10-325 MG tablet Take 1 tablet by mouth every 6 (six) hours as needed.  02/27/16  Yes [provider]  levothyroxine (SYNTHROID, LEVOTHROID) 100 MCG tablet Take 100 mcg by mouth daily. 10/22/17  Yes [provider]  lisinopril (PRINIVIL,ZESTRIL) 2.5 MG tablet Take 2.5 mg by mouth daily. 12/16/17  Yes [provider]  metFORMIN (GLUCOPHAGE) 500 MG tablet Take 500 mg by mouth 2 (two) times daily as needed.  01/16/16  Yes [provider]  metoprolol (LOPRESSOR) 50 MG tablet Take 50 mg by mouth 2 (two) times daily.  01/16/16  Yes [provider]  nitroGLYCERIN (NITROSTAT) 0.4 MG SL tablet Place 0.4 mg under the tongue every 5 (five) minutes as needed for chest pain.  03/08/16  Yes [provider]  omega-3 acid ethyl esters (LOVAZA) 1 g capsule  Take 2 capsules by mouth 2 (two) times daily. 12/17/17  Yes [provider]  pantoprazole (PROTONIX) 40 MG tablet Take 40 mg by mouth daily. 12/17/17  Yes [provider]    Allergies:    Allergies  Allergen Reactions  . Morphine Itching and Rash  . Sulfonamide Derivatives     REACTION: Rash    Social History:   Social History   Socioeconomic History  . Marital status: Married    Spouse name: [name]  . Number of children: 3  . Years of education: 10  . Highest education level: Not on file  Occupational History  . Occupation: Retired  Engineer, production  . Financial resource strain: Not on file  . Food insecurity:    Worry: Not on file    Inability: Not on file  . Transportation needs:    Medical: Not on file    Non-medical: Not on file  Tobacco Use  . Smoking status: Former Games developer  . Smokeless tobacco: Never Used  Substance and Sexual Activity  . Alcohol use: Yes  . Drug use: No  . Sexual activity: Not on file  Lifestyle  .  Physical activity:    Days per week: Not on file    Minutes per session: Not on file  . Stress: Not on file  Relationships  . Social connections:    Talks on phone: Not on file    Gets together: Not on file    Attends religious service: Not on file    Active member of club or organization: Not on file    Attends meetings of clubs or organizations: Not on file    Relationship status: Not on file  . Intimate partner violence:    Fear of current or ex partner: Not on file    Emotionally abused: Not on file    Physically abused: Not on file    Forced sexual activity: Not on file  Other Topics Concern  . Not on file  Social History Narrative   Lives at home w/ his wife   Right-handed   Caffeine: 2-3 cups of coffee each morning    Family History:   The patient's family history includes Bladder Cancer in his father; Cancer in his maternal grandfather; Colon cancer in his brother; Diabetes in his father; Lung cancer in his mother; Stroke in his maternal grandmother, paternal grandfather, and paternal grandmother.  ROS:  Please see the history of present illness.  All other ROS reviewed and negative.     Physical Exam/Data:   Vitals:   03/08/18 0200 03/08/18 0230 03/08/18 0300 03/08/18 0402  BP: (!) 142/69 (!) 145/75 (!) 145/71 123/64  Pulse: 62 (!) 59 65 (!) 59  Resp: 19 18 17 18   Temp:    98.3 F (36.8 C)  TempSrc:    Oral  SpO2: 100% 98% 97% 98%  Weight:      Height:        Intake/Output Summary (Last 24 hours) at 03/08/2018 0900 Last data filed at 03/08/2018 0406 Gross per 24 hour  Intake 0 ml  Output -  Net 0 ml   Filed Weights   03/07/18 2142  Weight: 175 lb (79.4 kg)   Body mass index is 25.84 kg/m.  General: Well developed, well nourished WM, in no acute distress. Head: Normocephalic, atraumatic, sclera non-icteric, no xanthomas, nares are without discharge.  Neck: Negative for carotid bruits. JVD not elevated. Lungs: Clear bilaterally to auscultation  without wheezes, rales, or rhonchi. Breathing  is unlabored. Heart: RRR with S1 S2. No murmurs, rubs, or gallops appreciated. Abdomen: Soft, non-tender, non-distended with normoactive bowel sounds. No hepatomegaly. No rebound/guarding. No obvious abdominal masses. Msk:  Strength and tone appear normal for age. Extremities: No clubbing or cyanosis. No edema.  Distal pedal pulses are 2+ and equal bilaterally. Neuro: Alert and oriented X 3. No facial asymmetry. No focal deficit. Moves all extremities spontaneously. Psych:  Responds to questions appropriately with a normal affect.  EKG:  The EKG was personally reviewed and demonstrates NSR no acute   Relevant CV Studies: As above  Laboratory Data:  Chemistry Recent Labs  Lab 03/07/18 2230  NA 136  K 4.4  CL 103  CO2 24  GLUCOSE 172*  BUN 19  CREATININE 1.43*  CALCIUM 9.2  GFRNONAA 48*  GFRAA 56*  ANIONGAP 9    Recent Labs  Lab 03/08/18 0756  PROT 6.9  ALBUMIN 3.8  AST 18  ALT 22  ALKPHOS 65  BILITOT 0.8   Hematology Recent Labs  Lab 03/07/18 2230  WBC 9.3  RBC 4.77  HGB 14.6  HCT 43.5  MCV 91.2  MCH 30.6  MCHC 33.6  RDW 13.6  PLT 209   Cardiac Enzymes Recent Labs  Lab 03/07/18 2230 03/08/18 0756  TROPONINI <0.03 <0.03    Recent Labs  Lab 03/08/18 0028 03/08/18 0157  TROPIPOC 0.00 0.00    BNPNo results for input(s): BNP, PROBNP in the last 168 hours.  DDimer No results for input(s): DDIMER in the last 168 hours.  Radiology/Studies:  Dg Chest 2 View  Result Date: 03/07/2018 CLINICAL DATA:  72 year old male with chest pain. EXAM: CHEST - 2 VIEW COMPARISON:  Chest radiograph dated 03/05/2018 FINDINGS: The heart size and mediastinal contours are within normal limits. Both lungs are clear. The visualized skeletal structures are unremarkable. IMPRESSION: No active cardiopulmonary disease. Electronically Signed   By: Elgie CollardArash  Radparvar M.D.   On: 03/07/2018 23:16    Assessment and Plan:   1. Chest  pain/dyspnea - somewhat atypical, no objective evidence of ischemia thus far despite several hours of discomfort. No signs/symptoms of PE. Asymptomatic this AM. Plan to review with MD.  2. Mild CAD by cath 2010 - continue ASA, statin. Metoprolol was not reordered, unclear why - will await IM input. Question whether this needs to be reconsidered given h/o significant depression symptoms.  3. Increased stress with h/o anxiety/depression and excess ETOH use prior to admission - may need addressing by primary team as this has been an undertone in remote notes.  4. Acute kidney injury - CR 1.43, prior value 1.16. Per IM.  5. History of stroke - continue ASA. [facility]. No residual although ex wife reports some mild dementia.  For questions or updates, please contact [facility] Please consult [email or web address] for contact info under Cardiology/STEMI.    Signed, Laurann Montanaayna N Dunn, PA-C  03/08/2018 9:00 AM   Attending note  Patient seen and discussed with PA Dunn, I agree with her documentation above. 72 yo male history of DM2, HL, HTN, CVA. Long history of chest pain with 3 caths in the past with nonobstructive disease last one in 2010. [facility]   He describes to me the sudden onset of stabbing pain midchest/epigastrium and left chest with significant nausea. Lasted about 12 hours constantly though varied in severity. Better with GI cocktail.  Cr 1.43 K 4.4 WBC 9.3 Hgb 14.6 Lipsase 33  Trop neg x  4 but only 4 hours apart, then 10 hrs after admit trop this AM negative.  CXR no acute process EKG NSR  His symptoms are noncardiac, 12 hours of constant pain better with GI cocktail. No objective evidence of ischemia by EKG or enzymes, echo 12/2017 normal LVE no WMAs. No further cardiac workup, we will signoff  [facility] will sign off.   Medication Recommendations:  No cardiac med recs Other recommendations (labs, testing, etc):  none Follow up as an  outpatient:  none    Dominga Ferry MD

## 2018-03-09 LAB — HIV ANTIBODY (ROUTINE TESTING W REFLEX): HIV Screen 4th Generation wRfx: NONREACTIVE

## 2018-03-13 DIAGNOSIS — R1013 Epigastric pain: Secondary | ICD-10-CM | POA: Diagnosis not present

## 2018-03-13 DIAGNOSIS — M545 Low back pain: Secondary | ICD-10-CM | POA: Diagnosis not present

## 2018-03-13 DIAGNOSIS — R079 Chest pain, unspecified: Secondary | ICD-10-CM | POA: Diagnosis not present

## 2018-03-16 DIAGNOSIS — I10 Essential (primary) hypertension: Secondary | ICD-10-CM | POA: Diagnosis not present

## 2018-03-16 DIAGNOSIS — E119 Type 2 diabetes mellitus without complications: Secondary | ICD-10-CM | POA: Diagnosis not present

## 2018-03-16 DIAGNOSIS — M4800 Spinal stenosis, site unspecified: Secondary | ICD-10-CM | POA: Diagnosis not present

## 2018-03-16 DIAGNOSIS — E782 Mixed hyperlipidemia: Secondary | ICD-10-CM | POA: Diagnosis not present

## 2018-03-16 DIAGNOSIS — R296 Repeated falls: Secondary | ICD-10-CM | POA: Diagnosis not present

## 2018-03-22 DIAGNOSIS — E1122 Type 2 diabetes mellitus with diabetic chronic kidney disease: Secondary | ICD-10-CM | POA: Diagnosis not present

## 2018-03-22 DIAGNOSIS — E039 Hypothyroidism, unspecified: Secondary | ICD-10-CM | POA: Diagnosis not present

## 2018-06-08 DIAGNOSIS — E11319 Type 2 diabetes mellitus with unspecified diabetic retinopathy without macular edema: Secondary | ICD-10-CM | POA: Diagnosis not present

## 2018-06-21 DIAGNOSIS — N183 Chronic kidney disease, stage 3 (moderate): Secondary | ICD-10-CM | POA: Diagnosis not present

## 2018-06-21 DIAGNOSIS — E1122 Type 2 diabetes mellitus with diabetic chronic kidney disease: Secondary | ICD-10-CM | POA: Diagnosis not present

## 2018-06-21 DIAGNOSIS — M4800 Spinal stenosis, site unspecified: Secondary | ICD-10-CM | POA: Diagnosis not present

## 2018-06-21 DIAGNOSIS — R296 Repeated falls: Secondary | ICD-10-CM | POA: Diagnosis not present

## 2018-06-28 DIAGNOSIS — I129 Hypertensive chronic kidney disease with stage 1 through stage 4 chronic kidney disease, or unspecified chronic kidney disease: Secondary | ICD-10-CM | POA: Diagnosis not present

## 2018-06-28 DIAGNOSIS — E782 Mixed hyperlipidemia: Secondary | ICD-10-CM | POA: Diagnosis not present

## 2018-06-28 DIAGNOSIS — G9009 Other idiopathic peripheral autonomic neuropathy: Secondary | ICD-10-CM | POA: Diagnosis not present

## 2018-06-28 DIAGNOSIS — E1122 Type 2 diabetes mellitus with diabetic chronic kidney disease: Secondary | ICD-10-CM | POA: Diagnosis not present

## 2018-06-28 DIAGNOSIS — N183 Chronic kidney disease, stage 3 (moderate): Secondary | ICD-10-CM | POA: Diagnosis not present

## 2018-07-26 ENCOUNTER — Emergency Department (HOSPITAL_COMMUNITY): Payer: Medicare Other

## 2018-07-26 ENCOUNTER — Encounter (HOSPITAL_COMMUNITY): Payer: Self-pay | Admitting: Emergency Medicine

## 2018-07-26 ENCOUNTER — Other Ambulatory Visit: Payer: Self-pay

## 2018-07-26 ENCOUNTER — Emergency Department (HOSPITAL_COMMUNITY)
Admission: EM | Admit: 2018-07-26 | Discharge: 2018-07-26 | Disposition: A | Discharge status: Home / Self Care | Payer: Medicare Other | Attending: Emergency Medicine | Admitting: Emergency Medicine

## 2018-07-26 DIAGNOSIS — Z7982 Long term (current) use of aspirin: Secondary | ICD-10-CM | POA: Diagnosis not present

## 2018-07-26 DIAGNOSIS — I2510 Atherosclerotic heart disease of native coronary artery without angina pectoris: Secondary | ICD-10-CM | POA: Insufficient documentation

## 2018-07-26 DIAGNOSIS — M25511 Pain in right shoulder: Secondary | ICD-10-CM | POA: Diagnosis not present

## 2018-07-26 DIAGNOSIS — I480 Paroxysmal atrial fibrillation: Secondary | ICD-10-CM | POA: Insufficient documentation

## 2018-07-26 DIAGNOSIS — N183 Chronic kidney disease, stage 3 (moderate): Secondary | ICD-10-CM | POA: Insufficient documentation

## 2018-07-26 DIAGNOSIS — Z79899 Other long term (current) drug therapy: Secondary | ICD-10-CM | POA: Diagnosis not present

## 2018-07-26 DIAGNOSIS — E119 Type 2 diabetes mellitus without complications: Secondary | ICD-10-CM | POA: Diagnosis not present

## 2018-07-26 DIAGNOSIS — S4992XA Unspecified injury of left shoulder and upper arm, initial encounter: Secondary | ICD-10-CM | POA: Diagnosis not present

## 2018-07-26 DIAGNOSIS — I129 Hypertensive chronic kidney disease with stage 1 through stage 4 chronic kidney disease, or unspecified chronic kidney disease: Secondary | ICD-10-CM | POA: Diagnosis not present

## 2018-07-26 DIAGNOSIS — S79912A Unspecified injury of left hip, initial encounter: Secondary | ICD-10-CM | POA: Diagnosis not present

## 2018-07-26 DIAGNOSIS — W19XXXA Unspecified fall, initial encounter: Secondary | ICD-10-CM

## 2018-07-26 DIAGNOSIS — M25552 Pain in left hip: Secondary | ICD-10-CM | POA: Insufficient documentation

## 2018-07-26 DIAGNOSIS — E039 Hypothyroidism, unspecified: Secondary | ICD-10-CM | POA: Insufficient documentation

## 2018-07-26 DIAGNOSIS — M25512 Pain in left shoulder: Secondary | ICD-10-CM | POA: Diagnosis not present

## 2018-07-26 MED ORDER — HYDROMORPHONE HCL 1 MG/ML IJ SOLN
1.0000 mg | Freq: Once | INTRAMUSCULAR | Status: AC
  Administered 2018-07-26: 1 mg via INTRAMUSCULAR
  Filled 2018-07-26: qty 1

## 2018-07-26 NOTE — ED Provider Notes (Signed)
Emergency Department Provider Note   I have reviewed the triage vital signs and the nursing notes.   HISTORY  Chief Complaint Fall   HPI [name] is a 72 y.o. male who has a history of multiple falls after having had a stroke in the past the presents the emerge department today with an of the fall.  He is having some left lateral shoulder pain and left lateral hip pain.  Able to ambulate but not improving much with medications at home so came here for evaluation.  The fall was last night.  Tried oxycodone with some relief but not significant.  Is having difficulty moving his left arm past midline towards the back.  No injuries elsewhere. No other associated or modifying symptoms.    Past Medical History:  Diagnosis Date  . Alcohol use   . Anxiety   . Chest pain   . Coronary artery disease    a. Nonobstructive CAD by cath 2010 with negative nuclear stress test 05/2013.  . Depression   . Diabetes mellitus   . Hyperlipidemia   . Hypertension   . Hypothyroidism   . Kidney problem    a. 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  . Mild aortic insufficiency   . PAT (paroxysmal atrial tachycardia) (HCC)   . Pericardial effusion    a. by echo 2014.  . Pulmonary nodules    resolved  . PVC's (premature ventricular contractions)   . Stroke (cerebrum) (HCC)   . 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 Wide-complex tachycardia (HCC)    a. per [name] note: Event monitor September 2014 sinus rhythm, occasional PVCs, nonsustained PAT, and one 7 beat run of a wide QRS complex at 110-120 bpm, with minimal changes QRS axis, and QRS morphology not similar to documented PVCs.     Patient Active Problem List   Diagnosis Date Noted  . Unstable angina (HCC) 03/08/2018  . Hypothyroidism 03/08/2018  . Anxiety 03/08/2018  . RUQ pain 03/08/2018  . Chronic back pain 03/08/2018  . CKD (chronic kidney disease) stage 3, GFR 30-59  ml/min (HCC) 03/08/2018  . Intractable back pain 12/29/2017  . Chest pain 12/28/2017  . Abnormality of gait 03/24/2016  . Stroke due to embolism of left carotid artery (HCC) 03/24/2016  . HLD (hyperlipidemia) 05/22/2009  . Essential hypertension 05/22/2009  . CAD, NATIVE VESSEL 05/22/2009  . CHEST PAIN-UNSPECIFIED 05/22/2009    Past Surgical History:  Procedure Laterality Date  . BACK SURGERY  1996  . BREAST LUMPECTOMY Left 1979  . CIRCUMCISION  1980  . MANDIBLE SURGERY Right 1969    Current Outpatient Rx  . Order #: [id number] Class: Historical Med  . Order #: [id number] Class: Historical Med  . Order #: [id number] Class: Historical Med  . Order #: [id number] Class: Historical Med  . Order #: [id number] Class: Historical Med  . Order #: [id number] Class: Historical Med  . Order #: [id number] Class: Historical Med  . Order #: [id number] Class: Historical Med  . Order #: [id number] Class: Historical Med  . Order #: [id number] Class: Historical Med  . Order #: [id number] Class: Historical Med  . Order #: [id number] Class: Historical Med  . Order #: [id number] Class: Historical Med  . Order #: [id number] Class: Historical Med  . Order #: [id number] Class: Historical Med  . Order #: [id number] Class: Historical Med  . Order #: [id number] Class: Normal    Allergies Morphine and Sulfonamide derivatives  Family History  Problem Relation Age of Onset  . Lung  cancer Mother   . Diabetes Father   . Bladder Cancer Father   . Colon cancer Brother   . Stroke Maternal Grandmother   . Cancer Maternal Grandfather   . Stroke Paternal Grandmother   . Stroke Paternal Grandfather     Social History Social History   Tobacco Use  . Smoking status: Former Games developermoker  . Smokeless tobacco: Never Used  Substance Use Topics  . Alcohol use: Not Currently  . Drug use: No    Review of Systems  All other systems negative except as documented in the HPI. All pertinent positives and negatives as reviewed in the  HPI. ____________________________________________   PHYSICAL EXAM:  VITAL SIGNS: ED Triage Vitals  Enc Vitals Group     BP 07/26/18 2009 140/69     Pulse Rate 07/26/18 2009 61     Resp 07/26/18 2009 16     Temp 07/26/18 2009 (!) 97.4 F (36.3 C)     Temp Source 07/26/18 2009 Temporal     SpO2 07/26/18 2009 95 %     Weight 07/26/18 2009 192 lb (87.1 kg)     Height 07/26/18 2009 5\' 9"  (1.753 m)   Constitutional: Alert and oriented. Well appearing and in no acute distress. Eyes: Conjunctivae are normal. PERRL. EOMI. Head: Atraumatic. Nose: No congestion/rhinnorhea. Mouth/Throat: Mucous membranes are moist.  Oropharynx non-erythematous. Neck: No stridor.  No meningeal signs.   Cardiovascular: Normal rate, regular rhythm. Good peripheral circulation. Grossly normal heart sounds.   Respiratory: Normal respiratory effort.  No retractions. Lungs CTAB. Gastrointestinal: Soft and nontender. No distention.  Musculoskeletal: ttp over left hip (muscle palpable as area of pain and tenderness), mild lateral shoulder pain but difficulty with full ROM of left shoulder. Neurologic:  Normal speech and language. No gross focal neurologic deficits are appreciated.  Skin:  Skin is warm, dry and intact. No rash noted.   ____________________________________________   RADIOLOGY  Dg Shoulder Left  Result Date: 07/26/2018 CLINICAL DATA:  Evaluate for shoulder fracture.  Fall. EXAM: LEFT SHOULDER - 2+ VIEW COMPARISON:  None. FINDINGS: There is no evidence of fracture or dislocation. There is no evidence of arthropathy or other focal bone abnormality. Soft tissues are unremarkable. IMPRESSION: Negative. Electronically Signed   By: Charlett NoseKevin  Dover M.D.   On: 07/26/2018 22:54   Dg Hip Unilat W Or Wo Pelvis 2-3 Views Left  Result Date: 07/26/2018 CLINICAL DATA:  Patient fell backwards yesterday landing on back. Severe lower back pain. EXAM: DG HIP (WITH OR WITHOUT PELVIS) 2-3V LEFT COMPARISON:  None.  FINDINGS: Lower lumbar facet arthrosis at L4-5 and L5-S1. Intact bony pelvis without diastasis. Slight joint space narrowing of both hips. No acute fracture is identified of either femora. Soft tissues are unremarkable. Overlying bowel demonstrates average stool retention within the colon. IMPRESSION: 1. Lower lumbar degenerative facet arthrosis. 2. Mild degenerative joint space narrowing of the hips without acute osseous abnormality. 3. Intact bony pelvis. Electronically Signed   By: Tollie Ethavid  Kwon M.D.   On: 07/26/2018 22:56    ____________________________________________   PROCEDURES  Procedure(s) performed:   Procedures   ____________________________________________   INITIAL IMPRESSION / ASSESSMENT AND PLAN / ED COURSE  Normal fall for the patient.  No evidence of fractures.  But he probably has rotator cuff injury with the difficulty moving his shoulder on the left.  Will follow with primary doctor if not improved in 5 to 7 days for further management.     Pertinent labs & imaging results that  were available during my care of the patient were reviewed by me and considered in my medical decision making (see chart for details).  ____________________________________________  FINAL CLINICAL IMPRESSION(S) / ED DIAGNOSES  Final diagnoses:  Fall, initial encounter     MEDICATIONS GIVEN DURING THIS VISIT:  Medications  HYDROmorphone (DILAUDID) injection 1 mg (1 mg Intramuscular Given 07/26/18 2202)     NEW OUTPATIENT MEDICATIONS STARTED DURING THIS VISIT:  New Prescriptions   No medications on file    Note:  This note was prepared with assistance of Dragon voice recognition software. Occasional wrong-word or sound-a-like substitutions may have occurred due to the inherent limitations of voice recognition software.   Marily Memos, MD 07/26/18 [phone]

## 2018-07-26 NOTE — ED Triage Notes (Signed)
Patient fell from a standing position to his back. Patient c/o L shoulder, hip, and side pain and L buttock pain with radiation down his leg. Took oxy 10 2 hours ago. Fell last night.

## 2018-08-12 DIAGNOSIS — I10 Essential (primary) hypertension: Secondary | ICD-10-CM | POA: Diagnosis not present

## 2018-08-12 DIAGNOSIS — Z7984 Long term (current) use of oral hypoglycemic drugs: Secondary | ICD-10-CM | POA: Diagnosis not present

## 2018-08-12 DIAGNOSIS — I252 Old myocardial infarction: Secondary | ICD-10-CM | POA: Diagnosis not present

## 2018-08-12 DIAGNOSIS — I2510 Atherosclerotic heart disease of native coronary artery without angina pectoris: Secondary | ICD-10-CM | POA: Diagnosis not present

## 2018-08-12 DIAGNOSIS — Z87891 Personal history of nicotine dependence: Secondary | ICD-10-CM | POA: Diagnosis not present

## 2018-08-12 DIAGNOSIS — E039 Hypothyroidism, unspecified: Secondary | ICD-10-CM | POA: Diagnosis not present

## 2018-08-12 DIAGNOSIS — Z79899 Other long term (current) drug therapy: Secondary | ICD-10-CM | POA: Diagnosis not present

## 2018-08-12 DIAGNOSIS — K219 Gastro-esophageal reflux disease without esophagitis: Secondary | ICD-10-CM | POA: Diagnosis not present

## 2018-08-12 DIAGNOSIS — W230XXA Caught, crushed, jammed, or pinched between moving objects, initial encounter: Secondary | ICD-10-CM | POA: Diagnosis not present

## 2018-08-12 DIAGNOSIS — E119 Type 2 diabetes mellitus without complications: Secondary | ICD-10-CM | POA: Diagnosis not present

## 2018-08-12 DIAGNOSIS — M79642 Pain in left hand: Secondary | ICD-10-CM | POA: Diagnosis not present

## 2018-08-12 DIAGNOSIS — S60222A Contusion of left hand, initial encounter: Secondary | ICD-10-CM | POA: Diagnosis not present

## 2018-11-02 ENCOUNTER — Encounter (HOSPITAL_COMMUNITY): Payer: Self-pay

## 2018-11-02 ENCOUNTER — Emergency Department (HOSPITAL_COMMUNITY): Payer: Medicare Other

## 2018-11-02 ENCOUNTER — Other Ambulatory Visit: Payer: Self-pay

## 2018-11-02 ENCOUNTER — Observation Stay (HOSPITAL_COMMUNITY)
Admission: EM | Admit: 2018-11-02 | Discharge: 2018-11-03 | Disposition: A | Discharge status: Home / Self Care | Payer: Medicare Other | Attending: Family Medicine | Admitting: Family Medicine

## 2018-11-02 DIAGNOSIS — R001 Bradycardia, unspecified: Secondary | ICD-10-CM | POA: Diagnosis not present

## 2018-11-02 DIAGNOSIS — E039 Hypothyroidism, unspecified: Secondary | ICD-10-CM | POA: Insufficient documentation

## 2018-11-02 DIAGNOSIS — I129 Hypertensive chronic kidney disease with stage 1 through stage 4 chronic kidney disease, or unspecified chronic kidney disease: Secondary | ICD-10-CM | POA: Insufficient documentation

## 2018-11-02 DIAGNOSIS — I2510 Atherosclerotic heart disease of native coronary artery without angina pectoris: Secondary | ICD-10-CM | POA: Insufficient documentation

## 2018-11-02 DIAGNOSIS — Z7982 Long term (current) use of aspirin: Secondary | ICD-10-CM | POA: Insufficient documentation

## 2018-11-02 DIAGNOSIS — R079 Chest pain, unspecified: Secondary | ICD-10-CM | POA: Diagnosis not present

## 2018-11-02 DIAGNOSIS — F1010 Alcohol abuse, uncomplicated: Secondary | ICD-10-CM | POA: Diagnosis not present

## 2018-11-02 DIAGNOSIS — R269 Unspecified abnormalities of gait and mobility: Secondary | ICD-10-CM

## 2018-11-02 DIAGNOSIS — E785 Hyperlipidemia, unspecified: Secondary | ICD-10-CM | POA: Diagnosis present

## 2018-11-02 DIAGNOSIS — E1122 Type 2 diabetes mellitus with diabetic chronic kidney disease: Secondary | ICD-10-CM | POA: Diagnosis not present

## 2018-11-02 DIAGNOSIS — N183 Chronic kidney disease, stage 3 unspecified: Secondary | ICD-10-CM | POA: Diagnosis present

## 2018-11-02 DIAGNOSIS — Z7984 Long term (current) use of oral hypoglycemic drugs: Secondary | ICD-10-CM | POA: Insufficient documentation

## 2018-11-02 DIAGNOSIS — F419 Anxiety disorder, unspecified: Secondary | ICD-10-CM | POA: Insufficient documentation

## 2018-11-02 DIAGNOSIS — Z87891 Personal history of nicotine dependence: Secondary | ICD-10-CM | POA: Diagnosis not present

## 2018-11-02 DIAGNOSIS — I63132 Cerebral infarction due to embolism of left carotid artery: Secondary | ICD-10-CM | POA: Diagnosis not present

## 2018-11-02 DIAGNOSIS — Z8673 Personal history of transient ischemic attack (TIA), and cerebral infarction without residual deficits: Secondary | ICD-10-CM | POA: Insufficient documentation

## 2018-11-02 DIAGNOSIS — Z794 Long term (current) use of insulin: Secondary | ICD-10-CM

## 2018-11-02 DIAGNOSIS — E119 Type 2 diabetes mellitus without complications: Secondary | ICD-10-CM

## 2018-11-02 DIAGNOSIS — Z79899 Other long term (current) drug therapy: Secondary | ICD-10-CM | POA: Insufficient documentation

## 2018-11-02 DIAGNOSIS — E782 Mixed hyperlipidemia: Secondary | ICD-10-CM | POA: Diagnosis present

## 2018-11-02 DIAGNOSIS — I10 Essential (primary) hypertension: Secondary | ICD-10-CM | POA: Diagnosis present

## 2018-11-02 LAB — BASIC METABOLIC PANEL
ANION GAP: 10 (ref 5–15)
BUN: 12 mg/dL (ref 8–23)
CO2: 23 mmol/L (ref 22–32)
Calcium: 8.9 mg/dL (ref 8.9–10.3)
Chloride: 101 mmol/L (ref 98–111)
Creatinine, Ser: 1.25 mg/dL — ABNORMAL HIGH (ref 0.61–1.24)
GFR calc Af Amer: >60 mL/min (ref >60)
GFR calc non Af Amer: 58 mL/min — ABNORMAL LOW (ref >60)
Glucose, Bld: 116 mg/dL — ABNORMAL HIGH (ref 70–99)
Potassium: 3.5 mmol/L (ref 3.5–5.1)
Sodium: 134 mmol/L — ABNORMAL LOW (ref 135–145)

## 2018-11-02 LAB — CBC
HCT: 39.5 % (ref 39.0–52.0)
Hemoglobin: 13 g/dL (ref 13.0–17.0)
MCH: 30.7 pg (ref 26.0–34.0)
MCHC: 32.9 g/dL (ref 30.0–36.0)
MCV: 93.4 fL (ref 80.0–100.0)
Platelets: 144 10*3/uL — ABNORMAL LOW (ref 150–400)
RBC: 4.23 MIL/uL (ref 4.22–5.81)
RDW: 13.8 % (ref 11.5–15.5)
WBC: 7.1 10*3/uL (ref 4.0–10.5)
nRBC: 0 % (ref 0.0–0.2)

## 2018-11-02 LAB — GLUCOSE, CAPILLARY: Glucose-Capillary: 190 mg/dL — ABNORMAL HIGH (ref 70–99)

## 2018-11-02 LAB — TROPONIN I
Troponin I: <0.03 ng/mL (ref <0.03)
Troponin I: <0.03 ng/mL (ref <0.03)

## 2018-11-02 MED ORDER — ONDANSETRON HCL 4 MG/2ML IJ SOLN: 4.0000 mg | Freq: Four times a day (QID) | INTRAMUSCULAR | Status: DC | PRN

## 2018-11-02 MED ORDER — NITROGLYCERIN 0.4 MG SL SUBL
0.4000 mg | SUBLINGUAL_TABLET | SUBLINGUAL | Status: DC | PRN
  Administered 2018-11-02: 0.4 mg via SUBLINGUAL
  Filled 2018-11-02: qty 1

## 2018-11-02 MED ORDER — LISINOPRIL 5 MG PO TABS
2.5000 mg | ORAL_TABLET | Freq: Every day | ORAL | Status: DC
  Administered 2018-11-03: 2.5 mg via ORAL
  Filled 2018-11-02: qty 1

## 2018-11-02 MED ORDER — PANTOPRAZOLE SODIUM 40 MG PO TBEC
40.0000 mg | DELAYED_RELEASE_TABLET | Freq: Every day | ORAL | Status: DC
  Administered 2018-11-03: 40 mg via ORAL
  Filled 2018-11-02: qty 1

## 2018-11-02 MED ORDER — OXYCODONE HCL 5 MG PO TABS
5.0000 mg | ORAL_TABLET | ORAL | Status: DC | PRN
  Administered 2018-11-02 – 2018-11-03 (×2): 5 mg via ORAL
  Filled 2018-11-02 (×2): qty 1

## 2018-11-02 MED ORDER — OXYCODONE-ACETAMINOPHEN 5-325 MG PO TABS
1.0000 | ORAL_TABLET | ORAL | Status: DC | PRN
  Administered 2018-11-02 – 2018-11-03 (×2): 1 via ORAL
  Filled 2018-11-02 (×2): qty 1

## 2018-11-02 MED ORDER — ENOXAPARIN SODIUM 40 MG/0.4ML ~~LOC~~ SOLN
40.0000 mg | SUBCUTANEOUS | Status: DC
  Administered 2018-11-02: 40 mg via SUBCUTANEOUS
  Filled 2018-11-02: qty 0.4

## 2018-11-02 MED ORDER — ALPRAZOLAM 1 MG PO TABS
1.0000 mg | ORAL_TABLET | Freq: Two times a day (BID) | ORAL | Status: DC
  Administered 2018-11-02 – 2018-11-03 (×2): 1 mg via ORAL
  Filled 2018-11-02 (×2): qty 1

## 2018-11-02 MED ORDER — OXYCODONE-ACETAMINOPHEN 10-325 MG PO TABS: 1.0000 | ORAL_TABLET | ORAL | Status: DC | PRN

## 2018-11-02 MED ORDER — INSULIN ASPART 100 UNIT/ML ~~LOC~~ SOLN: 0.0000 [IU] | Freq: Three times a day (TID) | SUBCUTANEOUS | Status: DC

## 2018-11-02 MED ORDER — NITROGLYCERIN 0.4 MG SL SUBL: 0.4000 mg | SUBLINGUAL_TABLET | SUBLINGUAL | Status: DC | PRN

## 2018-11-02 MED ORDER — ALUM & MAG HYDROXIDE-SIMETH 200-200-20 MG/5ML PO SUSP
30.0000 mL | Freq: Once | ORAL | Status: AC
  Administered 2018-11-02: 30 mL via ORAL
  Filled 2018-11-02: qty 30

## 2018-11-02 MED ORDER — OMEGA-3-ACID ETHYL ESTERS 1 G PO CAPS
2.0000 | ORAL_CAPSULE | Freq: Two times a day (BID) | ORAL | Status: DC
  Administered 2018-11-02 – 2018-11-03 (×2): 2 g via ORAL
  Filled 2018-11-02 (×2): qty 2

## 2018-11-02 MED ORDER — GABAPENTIN 300 MG PO CAPS
600.0000 mg | ORAL_CAPSULE | Freq: Three times a day (TID) | ORAL | Status: DC
  Administered 2018-11-02 – 2018-11-03 (×2): 600 mg via ORAL
  Filled 2018-11-02 (×2): qty 2

## 2018-11-02 MED ORDER — SODIUM CHLORIDE 0.9% FLUSH: 3.0000 mL | Freq: Once | INTRAVENOUS | Status: DC

## 2018-11-02 MED ORDER — LEVOTHYROXINE SODIUM 100 MCG PO TABS: 100.0000 ug | ORAL_TABLET | Freq: Every day | ORAL | Status: DC

## 2018-11-02 MED ORDER — ACETAMINOPHEN 325 MG PO TABS: 650.0000 mg | ORAL_TABLET | ORAL | Status: DC | PRN

## 2018-11-02 MED ORDER — FENTANYL CITRATE (PF) 100 MCG/2ML IJ SOLN: 6.2500 ug | INTRAMUSCULAR | Status: DC | PRN

## 2018-11-02 MED ORDER — ASPIRIN EC 81 MG PO TBEC
81.0000 mg | DELAYED_RELEASE_TABLET | Freq: Every day | ORAL | Status: DC
  Administered 2018-11-03: 81 mg via ORAL
  Filled 2018-11-02: qty 1

## 2018-11-02 MED ORDER — ASPIRIN 81 MG PO CHEW
324.0000 mg | CHEWABLE_TABLET | Freq: Once | ORAL | Status: AC
  Administered 2018-11-02: 324 mg via ORAL
  Filled 2018-11-02: qty 4

## 2018-11-02 MED ORDER — ATORVASTATIN CALCIUM 20 MG PO TABS: 20.0000 mg | ORAL_TABLET | Freq: Every day | ORAL | Status: DC

## 2018-11-02 MED ORDER — INSULIN ASPART 100 UNIT/ML ~~LOC~~ SOLN: 0.0000 [IU] | Freq: Every day | SUBCUTANEOUS | Status: DC

## 2018-11-02 NOTE — ED Provider Notes (Signed)
[facility] EMERGENCY DEPARTMENT Provider Note   CSN: [id number] Arrival date & time: 11/02/18  1440    History   Chief Complaint Chief Complaint  Patient presents with  . Chest Pain    HPI [name] is a 73 y.o. male.     HPI  Pt was seen at 1515. Per pt and his wife, c/o gradual onset and persistence of multiple intermittent episodes of chest "pain" that began 2200 last night. Pt describes the CP as left sided, dull, and with radiation into his left arm. Has been associated with SOB and nausea. Symptoms last approximately 15 minutes before resolving. Pt took his own SL ntg with improvement. Pt does not know what in particular makes his symptoms come and go otherwise. Pt endorses hx of similar symptoms, described as "when I had a heart attack." Denies palpitations, no cough, no back pain, no abd pain, no vomiting/diarrhea.     Past Medical History:  Diagnosis Date  . Alcohol use   . Anxiety   . Chest pain   . Coronary artery disease    a. Nonobstructive CAD by cath 2010 with negative nuclear stress test 05/2013.  . Depression   . Diabetes mellitus   . Hyperlipidemia   . Hypertension   . Hypothyroidism   . Kidney problem    a. 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  . Mild aortic insufficiency   . PAT (paroxysmal atrial tachycardia) (HCC)   . Pericardial effusion    a. by echo 2014.  . Pulmonary nodules    resolved  . PVC's (premature ventricular contractions)   . Stroke (cerebrum) (HCC)   . 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 Wide-complex tachycardia (HCC)    a. per [name] note: Event monitor September 2014 sinus rhythm, occasional PVCs, nonsustained PAT, and one 7 beat run of a wide QRS complex at 110-120 bpm, with minimal changes QRS axis, and QRS morphology not similar to documented PVCs.     Patient Active Problem List   Diagnosis Date Noted  . Unstable angina (HCC) 03/08/2018  .  Hypothyroidism 03/08/2018  . Anxiety 03/08/2018  . RUQ pain 03/08/2018  . Chronic back pain 03/08/2018  . CKD (chronic kidney disease) stage 3, GFR 30-59 ml/min (HCC) 03/08/2018  . Intractable back pain 12/29/2017  . Chest pain 12/28/2017  . Abnormality of gait 03/24/2016  . Stroke due to embolism of left carotid artery (HCC) 03/24/2016  . HLD (hyperlipidemia) 05/22/2009  . Essential hypertension 05/22/2009  . CAD, NATIVE VESSEL 05/22/2009  . CHEST PAIN-UNSPECIFIED 05/22/2009    Past Surgical History:  Procedure Laterality Date  . BACK SURGERY  1996  . BREAST LUMPECTOMY Left 1979  . CIRCUMCISION  1980  . MANDIBLE SURGERY Right 1969        Home Medications    Prior to Admission medications   Medication Sig Start Date End Date Taking? Authorizing Provider  ACCU-CHEK AVIVA PLUS test strip  02/27/16   [provider]  ALPRAZolam Prudy Feeler) 1 MG tablet Take 1 mg by mouth 2 (two) times daily as needed. 01/18/16   [provider]  aspirin 81 MG tablet Take 81 mg by mouth daily.    [provider]  atorvastatin (LIPITOR) 20 MG tablet Take 20 mg by mouth daily.  01/16/16   [provider]  cyclobenzaprine (FLEXERIL) 5 MG tablet Take 5 mg by mouth 3 (three) times daily as needed for  muscle spasms.    [provider]  DULoxetine (CYMBALTA) 60 MG capsule Take 60 mg by mouth daily. 12/17/17   [provider]  gabapentin (NEURONTIN) 300 MG capsule Take 300 mg by mouth 3 (three) times daily. 12/17/17   [provider]  glimepiride (AMARYL) 2 MG tablet Take 2 mg by mouth daily.  03/16/16   [provider]  hydrochlorothiazide (HYDRODIURIL) 25 MG tablet Take 25 mg by mouth daily.    [provider]  levothyroxine (SYNTHROID, LEVOTHROID) 100 MCG tablet Take 100 mcg by mouth daily. 10/22/17   [provider]  lisinopril (PRINIVIL,ZESTRIL) 2.5 MG tablet Take 2.5 mg by mouth daily. 12/16/17   [provider]   metFORMIN (GLUCOPHAGE) 500 MG tablet Take 500 mg by mouth 2 (two) times daily as needed.  01/16/16   [provider]  metoprolol (LOPRESSOR) 50 MG tablet Take 50 mg by mouth 2 (two) times daily.  01/16/16   [provider]  nitroGLYCERIN (NITROSTAT) 0.4 MG SL tablet Place 0.4 mg under the tongue every 5 (five) minutes as needed for chest pain.  03/08/16   [provider]  omega-3 acid ethyl esters (LOVAZA) 1 g capsule Take 2 capsules by mouth 2 (two) times daily. 12/17/17   [provider]  oxyCODONE-acetaminophen (PERCOCET) 10-325 MG tablet TAKE 1 TABLET BY MOUTH THREE TIMES DAILY AS NEEDED FOR SEVERE PAIN 02/08/18   [provider]  pantoprazole (PROTONIX) 40 MG tablet Take 1 tablet (40 mg total) by mouth daily. 03/08/18   Catarina Hartshorn, MD    Family History Family History  Problem Relation Age of Onset  . Lung cancer Mother   . Diabetes Father   . Bladder Cancer Father   . Colon cancer Brother   . Stroke Maternal Grandmother   . Cancer Maternal Grandfather   . Stroke Paternal Grandmother   . Stroke Paternal Grandfather     Social History Social History   Tobacco Use  . Smoking status: Former Games developer  . Smokeless tobacco: Never Used  Substance Use Topics  . Alcohol use: Not Currently  . Drug use: No     Allergies   Morphine and Sulfonamide derivatives   Review of Systems Review of Systems ROS: Statement: All systems negative except as marked or noted in the HPI; Constitutional: Negative for fever and chills. ; ; Eyes: Negative for eye pain, redness and discharge. ; ; ENMT: Negative for ear pain, hoarseness, nasal congestion, sinus pressure and sore throat. ; ; Cardiovascular: +CP, SOB. Negative for palpitations, diaphoresis, and peripheral edema. ; ; Respiratory: Negative for cough, wheezing and stridor. ; ; Gastrointestinal: +nausea. Negative for vomiting, diarrhea, abdominal pain, blood in stool, hematemesis, jaundice and rectal  bleeding. . ; ; Genitourinary: Negative for dysuria, flank pain and hematuria. ; ; Musculoskeletal: Negative for back pain and neck pain. Negative for swelling and trauma.; ; Skin: Negative for pruritus, rash, abrasions, blisters, bruising and skin lesion.; ; Neuro: Negative for headache, lightheadedness and neck stiffness. Negative for weakness, altered level of consciousness, altered mental status, extremity weakness, paresthesias, involuntary movement, seizure and syncope.       Physical Exam Updated Vital Signs BP (!) 156/82 (BP Location: Right Arm)   Pulse (!) 57   Temp (!) 97.5 F (36.4 C) (Oral)   Resp 18   Ht 5\' 9"  (1.753 m)   Wt 86.2 kg   SpO2 94%   BMI 28.06 kg/m   Physical Exam 1520: Physical examination:  Nursing notes  reviewed; Vital signs and O2 SAT reviewed;  Constitutional: Well developed, Well nourished, Well hydrated, In no acute distress; Head:  Normocephalic, atraumatic; Eyes: EOMI, PERRL, No scleral icterus; ENMT: Mouth and pharynx normal, Mucous membranes moist; Neck: Supple, Full range of motion, No lymphadenopathy; Cardiovascular: Regular rate and rhythm, No gallop; Respiratory: Breath sounds clear & equal bilaterally, No wheezes.  Speaking full sentences with ease, Normal respiratory effort/excursion; Chest: Nontender, Movement normal; Abdomen: Soft, Nontender, Nondistended, Normal bowel sounds; Genitourinary: No CVA tenderness; Extremities: Peripheral pulses normal, No tenderness, No edema, No calf edema or asymmetry.; Neuro: AA&Ox3, Major CN grossly intact.  Speech clear. No gross focal motor or sensory deficits in extremities.; Skin: Color normal, Warm, Dry.; Psych:  Affect flat.     ED Treatments / Results  Labs (all labs ordered are listed, but only abnormal results are displayed)   EKG None  Radiology   Procedures Procedures (including critical care time)  Medications Ordered in ED Medications  sodium chloride flush (NS) 0.9 % injection 3 mL (3  mLs Intravenous Not Given 11/02/18 1504)  aspirin chewable tablet 324 mg (has no administration in time range)  nitroGLYCERIN (NITROSTAT) SL tablet 0.4 mg (has no administration in time range)     Initial Impression / Assessment and Plan / ED Course  I have reviewed the triage vital signs and the nursing notes.  Pertinent labs & imaging results that were available during my care of the patient were reviewed by me and considered in my medical decision making (see chart for details).     MDM Reviewed: previous chart, nursing note and vitals Reviewed previous: labs and ECG Interpretation: labs, ECG and x-ray   ED ECG REPORT   Date: 11/02/2018  Rate: 54  Rhythm: sinus bradycardia  QRS Axis: normal  Intervals: PR prolonged  ST/T Wave abnormalities: normal  Conduction Disutrbances:none  Narrative Interpretation:   Old EKG Reviewed: unchanged; no significant changes from previous EKG dated 12/28/2017. I have personally reviewed the EKG tracing and agree with the computerized printout as noted.  Results for orders placed or performed during the hospital encounter of 11/02/18  Basic metabolic panel  Result Value Ref Range   Sodium 134 (L) 135 - 145 mmol/L   Potassium 3.5 3.5 - 5.1 mmol/L   Chloride 101 98 - 111 mmol/L   CO2 23 22 - 32 mmol/L   Glucose, Bld 116 (H) 70 - 99 mg/dL   BUN 12 8 - 23 mg/dL   Creatinine, Ser 1.611.25 (H) 0.61 - 1.24 mg/dL   Calcium 8.9 8.9 - 09.610.3 mg/dL   GFR calc non Af Amer 58 (L) >60 mL/min   GFR calc Af Amer >60 >60 mL/min   Anion gap 10 5 - 15  CBC  Result Value Ref Range   WBC 7.1 4.0 - 10.5 K/uL   RBC 4.23 4.22 - 5.81 MIL/uL   Hemoglobin 13.0 13.0 - 17.0 g/dL   HCT 04.539.5 40.939.0 - 81.152.0 %   MCV 93.4 80.0 - 100.0 fL   MCH 30.7 26.0 - 34.0 pg   MCHC 32.9 30.0 - 36.0 g/dL   RDW 91.413.8 78.211.5 - 95.615.5 %   Platelets 144 (L) 150 - 400 K/uL   nRBC 0.0 0.0 - 0.2 %  Troponin I - ONCE - STAT  Result Value Ref Range   Troponin I <0.03 <0.03 ng/mL   Dg Chest 2  View Result Date: 11/02/2018 CLINICAL DATA:  Chest pain EXAM: CHEST - 2 VIEW COMPARISON:  None. FINDINGS:  The heart size and mediastinal contours are within normal limits. Both lungs are clear. The visualized skeletal structures are unremarkable. IMPRESSION: No active cardiopulmonary disease. Electronically Signed   By: Deatra Robinson M.D.   On: 11/02/2018 15:48    1635:  ASA and SL ntg given with "some" improvement. GI cocktail and fentanyl ordered (pt has allergy to morphine).  Heart score 5, will observation admit.  T/C returned from [facility]. Gonzella Lex, case discussed, including:  HPI, pertinent PM/SHx, VS/PE, dx testing, ED course and treatment:  Agreeable to admit.    Final Clinical Impressions(s) / ED Diagnoses   Final diagnoses:  None    ED Discharge Orders    None       Samuel Jester, DO 11/06/18 2104

## 2018-11-02 NOTE — ED Notes (Signed)
Admitting MD at bedside.

## 2018-11-02 NOTE — ED Notes (Signed)
Report given to 300 RN at this time.  

## 2018-11-02 NOTE — ED Notes (Signed)
Pt notes no change in CP since nitro given.

## 2018-11-02 NOTE — H&P (Signed)
TRH H&P   Patient Demographics:    [name], is a 73 y.o. male  MRN: [id number]   DOB - 1946/06/11  Admit Date - 11/02/2018  Outpatient Primary MD for the patient is Margo Aye, Kathleene Hazel, MD  Referring MD: Dr. Clarene Duke  Outpatient Specialists: None  Patient coming from: Home  Chief Complaint  Patient presents with  . Chest Pain      HPI:    [name]  is a 73 y.o. male, with history of stroke secondary to left carotid artery embolism with residual lower extremity weakness, nonobstructive CAD as per cardiac cath in 2010 and negative stress test in 2014, hypertension, anxiety, hypothyroidism, type 2 diabetes mellitus with CKD stage III, hypertension, hyperlipidemia, anxiety and history of alcoholism (quit few years back), remote tobacco use who was brought to the ED by family with left-sided chest pain since last night.  Patient reports having sharp left-sided chest pain while lying in bed 7-8/10 in severity lasting for several minutes, resolving and then reoccurring throughout the night.  He took some nitroglycerin without much relief.  He reports shortness of breath associated with it.  Denies any chest trauma, palpitations, PND.  Denies any headache, blurred vision, dizziness, abdominal pain, dysuria, diarrhea, new weakness, tingling or numbness in his extremities.  Denies any fevers or chills, any sick contact or recent travel.  Denies any recent change in his medication.  In the ED he was bradycardic to high 40s, stable blood pressure, respiratory rate, normal temperature and O2 sat on room air. Blood work showed normal CBC except for platelet of 144, sodium of 134, creatinine of 1.25, initial troponin was negative and glucose of 116. Chest x-ray unremarkable.  EKG showed sinus bradycardia with first-degree AV block. Patient given sublingual nitrate, full dose aspirin, IV  fentanyl and Maalox.  Right before I saw him he had an episode of vomiting of food particles and also developed a macular rash in his right cheek.  Patient reports his chest pain to be about 2/10 after getting treatment in the ED. Hospitalist consulted for observation on telemetry for chest pain rule out.    Review of systems:    In addition to the HPI above,  No Fever-chills, No Headache, No changes with Vision or hearing, No problems swallowing food or Liquids, Chest pain + + +, shortness of breath + +, no cough No Abdominal pain, nausea and vomiting +, Bowel movements are regular, No Blood in stool or Urine, No dysuria, No new skin rashes or bruises, No new joints pains-aches,  No new weakness, tingling, numbness in any extremity, No recent weight gain or loss, No polyuria, polydypsia or polyphagia, No significant Mental Stressors.     With Past History of the following :    Past Medical History:  Diagnosis Date  . Alcohol use   . Anxiety   . Chest  pain   . Coronary artery disease    a. Nonobstructive CAD by cath 2010 with negative nuclear stress test 05/2013.  . Depression   . Diabetes mellitus   . Hyperlipidemia   . Hypertension   . Hypothyroidism   . Kidney problem    a. 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  . Mild aortic insufficiency   . PAT (paroxysmal atrial tachycardia) (HCC)   . Pericardial effusion    a. by echo 2014.  . Pulmonary nodules    resolved  . PVC's (premature ventricular contractions)   . Stroke (cerebrum) (HCC)   . 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 Wide-complex tachycardia (HCC)    a. per [name] note: Event monitor September 2014 sinus rhythm, occasional PVCs, nonsustained PAT, and one 7 beat run of a wide QRS complex at 110-120 bpm, with minimal changes QRS axis, and QRS morphology not similar to documented PVCs.       Past Surgical History:  Procedure Laterality Date  .  BACK SURGERY  1996  . BREAST LUMPECTOMY Left 1979  . CIRCUMCISION  1980  . MANDIBLE SURGERY Right 1969      Social History:     Social History   Tobacco Use  . Smoking status: Former Games developer  . Smokeless tobacco: Never Used  Substance Use Topics  . Alcohol use: Not Currently     Lives -home with wife  Mobility -ambulates with a cane   Family History :     Family History  Problem Relation Age of Onset  . Lung cancer Mother   . Diabetes Father   . Bladder Cancer Father   . Colon cancer Brother   . Stroke Maternal Grandmother   . Cancer Maternal Grandfather   . Stroke Paternal Grandmother   . Stroke Paternal Grandfather       Home Medications:   Prior to Admission medications   Medication Sig Start Date End Date Taking? Authorizing Provider  ALPRAZolam Prudy Feeler) 1 MG tablet Take 1 mg by mouth 2 (two) times daily.  01/18/16  Yes [provider]  aspirin 81 MG tablet Take 81 mg by mouth daily.   Yes [provider]  atorvastatin (LIPITOR) 20 MG tablet Take 20 mg by mouth daily.  01/16/16  Yes [provider]  gabapentin (NEURONTIN) 600 MG tablet Take 600 mg by mouth 3 (three) times daily.  12/17/17  Yes [provider]  glimepiride (AMARYL) 2 MG tablet Take 2 mg by mouth daily.  03/16/16  Yes [provider]  levothyroxine (SYNTHROID, LEVOTHROID) 100 MCG tablet Take 100 mcg by mouth daily. 10/22/17  Yes [provider]  lisinopril (PRINIVIL,ZESTRIL) 2.5 MG tablet Take 2.5 mg by mouth daily. 12/16/17  Yes [provider]  metoprolol (LOPRESSOR) 50 MG tablet Take 50 mg by mouth 2 (two) times daily.  01/16/16  Yes [provider]  nitroGLYCERIN (NITROSTAT) 0.4 MG SL tablet Place 0.4 mg under the tongue every 5 (five) minutes as needed for chest pain.  03/08/16  Yes [provider]  omega-3 acid ethyl esters (LOVAZA) 1 g capsule Take 2 capsules by mouth 2 (two) times daily. 12/17/17  Yes [provider]  omeprazole (PRILOSEC) 40 MG capsule Take 40 mg by mouth daily.   Yes [provider]  oxyCODONE-acetaminophen (PERCOCET) 10-325 MG tablet Take 1 tablet by mouth every 4 (four) hours as needed for pain.  02/08/18  Yes  [provider]     Allergies:     Allergies  Allergen Reactions  . Morphine Itching and Rash  . Sulfonamide Derivatives     REACTION: Rash     Physical Exam:   Vitals  Blood pressure 139/71, pulse (!) 49, temperature (!) 97.5 F (36.4 C), temperature source Oral, resp. rate 13, height 5\' 9"  (1.753 m), weight 86.2 kg, SpO2 98 %.  Elderly male lying in bed in no acute distress, fatigue HEENT: Macular rash over the right cheek ,, no warmth or tenderness, no pallor, no icterus, moist mucosa, supple neck, no cervical lymphadenopathy Chest: No reproducible pain to pressure over the chest, clear to auscultation bilaterally, no added sounds CVS: S1-S2 bradycardic, no murmurs rub or gallop GI: Soft, nondistended, nontender, bowel sounds present Musculoskeletal: Warm, no edema CNS: Alert and oriented, nonfocal   Data Review:    CBC Recent Labs  Lab 11/02/18 1504  WBC 7.1  HGB 13.0  HCT 39.5  PLT 144*  MCV 93.4  MCH 30.7  MCHC 32.9  RDW 13.8   ------------------------------------------------------------------------------------------------------------------  Chemistries  Recent Labs  Lab 11/02/18 1504  NA 134*  K 3.5  CL 101  CO2 23  GLUCOSE 116*  BUN 12  CREATININE 1.25*  CALCIUM 8.9   ------------------------------------------------------------------------------------------------------------------ estimated creatinine clearance is 59 mL/min (A) (by C-G formula based on SCr of 1.25 mg/dL (H)). ------------------------------------------------------------------------------------------------------------------ No results for input(s): TSH, T4TOTAL, T3FREE, THYROIDAB in the last 72 hours.  Invalid input(s):  FREET3  Coagulation profile No results for input(s): INR, PROTIME in the last 168 hours. ------------------------------------------------------------------------------------------------------------------- No results for input(s): DDIMER in the last 72 hours. -------------------------------------------------------------------------------------------------------------------  Cardiac Enzymes Recent Labs  Lab 11/02/18 1504  TROPONINI <0.03   ------------------------------------------------------------------------------------------------------------------ No results found for: BNP   ---------------------------------------------------------------------------------------------------------------  Urinalysis No results found for: COLORURINE, APPEARANCEUR, LABSPEC, PHURINE, GLUCOSEU, HGBUR, BILIRUBINUR, KETONESUR, PROTEINUR, UROBILINOGEN, NITRITE, LEUKOCYTESUR  ----------------------------------------------------------------------------------------------------------------   Imaging Results:    Dg Chest 2 View  Result Date: 11/02/2018 CLINICAL DATA:  Chest pain EXAM: CHEST - 2 VIEW COMPARISON:  None. FINDINGS: The heart size and mediastinal contours are within normal limits. Both lungs are clear. The visualized skeletal structures are unremarkable. IMPRESSION: No active cardiopulmonary disease. Electronically Signed   By: Deatra Robinson M.D.   On: 11/02/2018 15:48    My personal review of EKG: Sinus bradycardia at 49 with first-degree AV block, no ST-T changes   Assessment & Plan:    Principal Problem:   Chest pain at rest Patient has both typical and atypical symptoms.  He has risk factors including prior history of CAD, hypertension, hyperlipidemia, history of stroke and diabetes mellitus type 2. Heart score of 5.  Chest pain has currently improved to 2. Placed on observation on telemetry.  Cycle cardiac enzymes and EKG. Obtain 2D echo in a.m.  Cardiology consult for stress  test. Resume aspirin, statin and lisinopril will hold metoprolol given sinus bradycardia and first-degree AV block on EKG.  Sublingual nitrate PRN.   Active Problems:    Essential hypertension Stable.  Resume lisinopril.    CAD, NATIVE VESSEL Plan as outlined above.  Holding metoprolol given sinus bradycardia.    Abnormality of gait   Stroke due to embolism of left carotid artery (HCC) Has residual weakness and per wife and daughter has had frequent falls for past several months with unsteady gait.  No loss of consciousness.  Also has a history of alcoholism and quit for past few years already. PT evaluation.  Diabetes mellitus type 2 with nephropathy Hold oral hypoglycemic.  Monitor on sliding scale coverage.   Hypothyroidism Resume Synthroid    Anxiety Continue home dose Xanax    CKD (chronic kidney disease) stage 3, GFR 30-59 ml/min (HCC) Likely at baseline.    DVT Prophylaxis: Subcu Lovenox AM Labs Ordered, also please review Full Orders  Family Communication: Admission, patients condition and plan of care including tests being ordered have been discussed with the patient, wife and daughter at bedside  Code Status full code  Likely DC to home after cardiac work-up  Condition: Fair  Consults called: Cardiology  Admission status: Observation on telemetry  Time spent in minutes : 50   Sharesa Kemp M.D on 11/02/2018 at 5:14 PM  Between 7am to 7pm - Pager - [phone]. After 7pm go to [email or web address] - password [facility]  [facility] - Office  [phone]

## 2018-11-02 NOTE — ED Notes (Signed)
ED TO INPATIENT HANDOFF REPORT  ED Nurse Name and Phone #: [phone]  S Name/Age/Gender [name] 73 y.o. male Room/Bed: APA03/APA03  Code Status   Code Status: Prior  Home/SNF/Other Home Patient oriented to: self, place, time and situation Is this baseline? Yes   Triage Complete: Triage complete  Chief Complaint chest pain  Triage Note Pt presents to ED with chest pain which started last night at 2200. Pt c/o left sided chest pain, left arm pain, dizziness, and sob. Pt also c/o nausea. Pt took nitro at 1400   Allergies Allergies  Allergen Reactions  . Morphine Itching and Rash  . Sulfonamide Derivatives     REACTION: Rash    Level of Care/Admitting Diagnosis ED Disposition    ED Disposition Condition Comment   Admit  Hospital Area: [facility] [[id number]]  Level of Care: Telemetry [5]  Diagnosis: Chest pain at rest [643838]  Admitting Physician: Eddie North [phone]  Attending Physician: Eddie North [[id number]]  PT Class (Do Not Modify): Observation [104]  PT Acc Code (Do Not Modify): Observation [10022]       B Medical/Surgery History Past Medical History:  Diagnosis Date  . Alcohol use   . Anxiety   . Chest pain   . Coronary artery disease    a. Nonobstructive CAD by cath 2010 with negative nuclear stress test 05/2013.  . Depression   . Diabetes mellitus   . Hyperlipidemia   . Hypertension   . Hypothyroidism   . Kidney problem    a. 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  . Mild aortic insufficiency   . PAT (paroxysmal atrial tachycardia) (HCC)   . Pericardial effusion    a. by echo 2014.  . Pulmonary nodules    resolved  . PVC's (premature ventricular contractions)   . Stroke (cerebrum) (HCC)   . 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 Wide-complex tachycardia (HCC)    a. per [name] note: Event monitor September 2014 sinus rhythm, occasional PVCs, nonsustained PAT,  and one 7 beat run of a wide QRS complex at 110-120 bpm, with minimal changes QRS axis, and QRS morphology not similar to documented PVCs.    Past Surgical History:  Procedure Laterality Date  . BACK SURGERY  1996  . BREAST LUMPECTOMY Left 1979  . CIRCUMCISION  1980  . MANDIBLE SURGERY Right 1969     A IV Location/Drains/Wounds Patient Lines/Drains/Airways Status   Active Line/Drains/Airways    Name:   Placement date:   Placement time:   Site:   Days:   Peripheral IV 11/02/18 Right Antecubital   11/02/18    1501    Antecubital   less than 1          Intake/Output Last 24 hours No intake or output data in the 24 hours ending 11/02/18 1818  Labs/Imaging Results for orders placed or performed during the hospital encounter of 11/02/18 (from the past 48 hour(s))  Basic metabolic panel     Status: Abnormal   Collection Time: 11/02/18  3:04 PM  Result Value Ref Range   Sodium 134 (L) 135 - 145 mmol/L   Potassium 3.5 3.5 - 5.1 mmol/L   Chloride 101 98 - 111 mmol/L   CO2 23 22 - 32 mmol/L   Glucose, Bld 116 (H) 70 - 99 mg/dL   BUN 12 8 - 23 mg/dL   Creatinine, Ser 3.75 (H) 0.61 - 1.24 mg/dL  Calcium 8.9 8.9 - 10.3 mg/dL   GFR calc non Af Amer 58 (L) >60 mL/min   GFR calc Af Amer >60 >60 mL/min   Anion gap 10 5 - 15    Comment: Performed at [facility], [address], [place], [place] [address]  CBC     Status: Abnormal   Collection Time: 11/02/18  3:04 PM  Result Value Ref Range   WBC 7.1 4.0 - 10.5 K/uL   RBC 4.23 4.22 - 5.81 MIL/uL   Hemoglobin 13.0 13.0 - 17.0 g/dL   HCT 26.9 48.5 - 46.2 %   MCV 93.4 80.0 - 100.0 fL   MCH 30.7 26.0 - 34.0 pg   MCHC 32.9 30.0 - 36.0 g/dL   RDW 70.3 50.0 - 93.8 %   Platelets 144 (L) 150 - 400 K/uL   nRBC 0.0 0.0 - 0.2 %    Comment: Performed at [facility], [address] [place], [place] [address]  Troponin I - ONCE - STAT     Status: None   Collection Time: 11/02/18  3:04 PM  Result Value Ref Range   Troponin I <0.03 <0.03 ng/mL     Comment: Performed at [facility], [address], [place], [place] [address]   Dg Chest 2 View  Result Date: 11/02/2018 CLINICAL DATA:  Chest pain EXAM: CHEST - 2 VIEW COMPARISON:  None. FINDINGS: The heart size and mediastinal contours are within normal limits. Both lungs are clear. The visualized skeletal structures are unremarkable. IMPRESSION: No active cardiopulmonary disease. Electronically Signed   By: Deatra Robinson M.D.   On: 11/02/2018 15:48    Pending Labs [employer] (From admission, onward)    Start     Ordered   Signed and Held  Troponin I - Now Then Q6H  Now then every 6 hours,   TIMED     Signed and Held   Signed and Held  CBC  (enoxaparin (LOVENOX)    CrCl >/= 30 ml/min)  Once,   R    Comments:  Baseline for enoxaparin therapy IF NOT ALREADY DRAWN.  Notify MD if PLT < 100 K.    Signed and Held   Signed and Held  Creatinine, serum  (enoxaparin (LOVENOX)    CrCl >/= 30 ml/min)  Once,   R    Comments:  Baseline for enoxaparin therapy IF NOT ALREADY DRAWN.    Signed and Held   Signed and Held  Creatinine, serum  (enoxaparin (LOVENOX)    CrCl >/= 30 ml/min)  Weekly,   R    Comments:  while on enoxaparin therapy    Signed and Held          Vitals/Pain Today's Vitals   11/02/18 1545 11/02/18 1600 11/02/18 1630 11/02/18 1710  BP: 129/72 133/62 139/71   Pulse: (!) 51 (!) 52 (!) 49   Resp: 13 18 13    Temp:      TempSrc:      SpO2: 97% 96% 98%   Weight:      Height:      PainSc: 4    3     Isolation Precautions No active isolations  Medications Medications  sodium chloride flush (NS) 0.9 % injection 3 mL (3 mLs Intravenous Not Given 11/02/18 1504)  nitroGLYCERIN (NITROSTAT) SL tablet 0.4 mg (0.4 mg Sublingual Given 11/02/18 1546)  fentaNYL (SUBLIMAZE) injection 6.5 mcg (has no administration in time range)  aspirin chewable tablet 324 mg (324 mg Oral Given 11/02/18 1546)  alum & mag hydroxide-simeth (MAALOX/MYLANTA) 200-200-20 MG/5ML suspension 30 mL (30  mLs Oral Given 11/02/18 1710)    Mobility walks Moderate fall risk   Focused Assessments    R Recommendations: See Admitting Provider Note  Report given to:   Additional Notes:

## 2018-11-02 NOTE — ED Notes (Signed)
Pt returned from xray at this time.

## 2018-11-02 NOTE — ED Triage Notes (Signed)
Pt presents to ED with chest pain which started last night at 2200. Pt c/o left sided chest pain, left arm pain, dizziness, and sob. Pt also c/o nausea. Pt took nitro at 1400

## 2018-11-02 NOTE — Plan of Care (Signed)

## 2018-11-03 ENCOUNTER — Observation Stay (HOSPITAL_BASED_OUTPATIENT_CLINIC_OR_DEPARTMENT_OTHER): Payer: Medicare Other

## 2018-11-03 DIAGNOSIS — I2510 Atherosclerotic heart disease of native coronary artery without angina pectoris: Secondary | ICD-10-CM

## 2018-11-03 DIAGNOSIS — I2583 Coronary atherosclerosis due to lipid rich plaque: Secondary | ICD-10-CM

## 2018-11-03 DIAGNOSIS — R269 Unspecified abnormalities of gait and mobility: Secondary | ICD-10-CM

## 2018-11-03 DIAGNOSIS — E782 Mixed hyperlipidemia: Secondary | ICD-10-CM

## 2018-11-03 DIAGNOSIS — I1310 Hypertensive heart and chronic kidney disease without heart failure, with stage 1 through stage 4 chronic kidney disease, or unspecified chronic kidney disease: Secondary | ICD-10-CM

## 2018-11-03 DIAGNOSIS — R079 Chest pain, unspecified: Secondary | ICD-10-CM

## 2018-11-03 DIAGNOSIS — F419 Anxiety disorder, unspecified: Secondary | ICD-10-CM | POA: Diagnosis not present

## 2018-11-03 DIAGNOSIS — N183 Chronic kidney disease, stage 3 (moderate): Secondary | ICD-10-CM | POA: Diagnosis not present

## 2018-11-03 DIAGNOSIS — I480 Paroxysmal atrial fibrillation: Secondary | ICD-10-CM

## 2018-11-03 DIAGNOSIS — R296 Repeated falls: Secondary | ICD-10-CM

## 2018-11-03 LAB — ECHOCARDIOGRAM COMPLETE
Height: 69 in
Weight: 3040 oz

## 2018-11-03 LAB — GLUCOSE, CAPILLARY
Glucose-Capillary: 91 mg/dL (ref 70–99)
Glucose-Capillary: 93 mg/dL (ref 70–99)

## 2018-11-03 LAB — TROPONIN I
Troponin I: <0.03 ng/mL (ref <0.03)
Troponin I: <0.03 ng/mL (ref <0.03)

## 2018-11-03 MED ORDER — CLOTRIMAZOLE 1 % EX CREA
TOPICAL_CREAM | Freq: Two times a day (BID) | CUTANEOUS | Status: DC
  Filled 2018-11-03: qty 15

## 2018-11-03 MED ORDER — CLOTRIMAZOLE 1 % EX CREA: TOPICAL_CREAM | Freq: Two times a day (BID) | CUTANEOUS | 0 refills | Status: DC

## 2018-11-03 NOTE — Consult Note (Addendum)
Cardiology Consult    Patient ID: JERMANY TWENTER; [id number]; Jan 21, 1946   Admit date: 11/02/2018 Date of Consult: 11/03/2018  Primary Care Provider: Benita Stabile, MD Primary Cardiologist: Dina Rich, MD   Patient Profile    JAHOD RADDE is a 73 y.o. male with past medical history of CAD (nonobstructive disease by cath in 2010 with low-risk NST in 2014), HTN, HLD, Type 2 DM, PAT (by event monitor in 2014 with occasional episodes of NSVT), Stage 3 CKD, carotid artery stenosis, and prior CVA's who is being seen today for the evaluation of chest pain at the request of Dr. Gonzella Lex.   History of Present Illness    Mr. Neault presented to [facility] ED on 11/02/2018 for evaluation of chest pain. In talking to the patient and his ex-wife today, he reports having episodes of chest discomfort for the past several years.  Says that yesterday he was having episodes of vomiting and his chest discomfort started after that. He did take sublingual nitroglycerin at home with no improvement in his symptoms. He is not overly active at baseline secondary to lower extremity weakness since his prior CVA and does not exercise regularly.  His pain is not worse with exertion or positional changes. Denies any recent dyspnea on exertion. No recent orthopnea or PND. He does experience intermittent lower extremity edema. He does report frequent mechanical falls, with this happening over 10 times within the past several months. Denies any prodromal symptoms or palpitations at that time. Denies any actual syncope. Says that his legs "give out" and he falls backwards.  Says that he has been under increased stress at home as several of his children and grandchildren are addicted to drugs. One of his children's friends actually overdosed in his house last week and were revived with Narcan.  Initial labs showed WBC 7.1, Hgb 13.0, platelets 144, Na+ 134, K+ 3.5, and creatinine 1.25 (close to baseline. Initial  and cyclic troponin values have been negative. CXR shows no active cardiopulmonary disease. Initial EKG shows sinus bradycardia, HR 54, with 1st degree AV Block. No acute ST changes.   He has been followed on telemetry had had a brief episode of atrial fibrillation last night which was rate-controlled. It is unclear when the event started but he converted back to NSR around 1900. Was in NSR at 1500 when he arrived to the hospital. No recurrences this morning.    Past Medical History:  Diagnosis Date  . Alcohol use   . Anxiety   . Chest pain   . Coronary artery disease    a. Nonobstructive CAD by cath 2010 with negative nuclear stress test 05/2013.  . Depression   . Diabetes mellitus   . Hyperlipidemia   . Hypertension   . Hypothyroidism   . Kidney problem    a. 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  . Mild aortic insufficiency   . PAT (paroxysmal atrial tachycardia) (HCC)   . Pericardial effusion    a. by echo 2014.  . Pulmonary nodules    resolved  . PVC's (premature ventricular contractions)   . Stroke (cerebrum) (HCC)   . 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 Wide-complex tachycardia (HCC)    a. per [name] note: Event monitor September 2014 sinus rhythm, occasional PVCs, nonsustained PAT, and one 7 beat run of a wide QRS complex at 110-120 bpm, with minimal changes QRS axis, and QRS morphology  not similar to documented PVCs.     Past Surgical History:  Procedure Laterality Date  . BACK SURGERY  1996  . BREAST LUMPECTOMY Left 1979  . CIRCUMCISION  1980  . MANDIBLE SURGERY Right 1969     Home Medications:  Prior to Admission medications   Medication Sig Start Date End Date Taking? Authorizing Provider  ALPRAZolam Prudy Feeler(XANAX) 1 MG tablet Take 1 mg by mouth 2 (two) times daily.  01/18/16  Yes [provider]  aspirin 81 MG tablet Take 81 mg by mouth daily.   Yes [provider]  atorvastatin (LIPITOR)  20 MG tablet Take 20 mg by mouth daily.  01/16/16  Yes [provider]  gabapentin (NEURONTIN) 600 MG tablet Take 600 mg by mouth 3 (three) times daily.  12/17/17  Yes [provider]  glimepiride (AMARYL) 2 MG tablet Take 2 mg by mouth daily.  03/16/16  Yes [provider]  levothyroxine (SYNTHROID, LEVOTHROID) 100 MCG tablet Take 100 mcg by mouth daily. 10/22/17  Yes [provider]  lisinopril (PRINIVIL,ZESTRIL) 2.5 MG tablet Take 2.5 mg by mouth daily. 12/16/17  Yes [provider]  metoprolol (LOPRESSOR) 50 MG tablet Take 50 mg by mouth 2 (two) times daily.  01/16/16  Yes [provider]  nitroGLYCERIN (NITROSTAT) 0.4 MG SL tablet Place 0.4 mg under the tongue every 5 (five) minutes as needed for chest pain.  03/08/16  Yes [provider]  omega-3 acid ethyl esters (LOVAZA) 1 g capsule Take 2 capsules by mouth 2 (two) times daily. 12/17/17  Yes [provider]  omeprazole (PRILOSEC) 40 MG capsule Take 40 mg by mouth daily.   Yes [provider]  oxyCODONE-acetaminophen (PERCOCET) 10-325 MG tablet Take 1 tablet by mouth every 4 (four) hours as needed for pain.  02/08/18  Yes [provider]    Inpatient Medications: Scheduled Meds: . ALPRAZolam  1 mg Oral BID  . aspirin EC  81 mg Oral Daily  . atorvastatin  20 mg Oral q1800  . clotrimazole   Topical BID  . enoxaparin (LOVENOX) injection  40 mg Subcutaneous Q24H  . gabapentin  600 mg Oral TID  . insulin aspart  0-15 Units Subcutaneous TID WC  . insulin aspart  0-5 Units Subcutaneous QHS  . levothyroxine  100 mcg Oral Q0600  . lisinopril  2.5 mg Oral Daily  . omega-3 acid ethyl esters  2 capsule Oral BID  . pantoprazole  40 mg Oral Daily  . sodium chloride flush  3 mL Intravenous Once   Continuous Infusions:  PRN Meds: acetaminophen, fentaNYL (SUBLIMAZE) injection, nitroGLYCERIN, ondansetron (ZOFRAN) IV, oxyCODONE-acetaminophen **AND**  oxyCODONE  Allergies:    Allergies  Allergen Reactions  . Morphine Itching and Rash  . Sulfonamide Derivatives     REACTION: Rash    Social History:   Social History   Socioeconomic History  . Marital status: Married    Spouse name: Burna MortimerWanda  . Number of children: 3  . Years of education: 10  . Highest education level: Not on file  Occupational History  . Occupation: Retired  Engineer, productionocial Needs  . Financial resource strain: Not on file  . Food insecurity:    Worry: Not on file    Inability: Not on file  . Transportation needs:    Medical: Not on file    Non-medical: Not on file  Tobacco Use  . Smoking status: Former Games developermoker  . Smokeless tobacco: Never Used  Substance and Sexual Activity  .  Alcohol use: Not Currently  . Drug use: No  . Sexual activity: Not on file  Lifestyle  . Physical activity:    Days per week: Not on file    Minutes per session: Not on file  . Stress: Not on file  Relationships  . Social connections:    Talks on phone: Not on file    Gets together: Not on file    Attends religious service: Not on file    Active member of club or organization: Not on file    Attends meetings of clubs or organizations: Not on file    Relationship status: Not on file  . Intimate partner violence:    Fear of current or ex partner: Not on file    Emotionally abused: Not on file    Physically abused: Not on file    Forced sexual activity: Not on file  Other Topics Concern  . Not on file  Social History Narrative   Lives at home w/ his wife   Right-handed   Caffeine: 2-3 cups of coffee each morning     Family History:    Family History  Problem Relation Age of Onset  . Lung cancer Mother   . Diabetes Father   . Bladder Cancer Father   . Colon cancer Brother   . Stroke Maternal Grandmother   . Cancer Maternal Grandfather   . Stroke Paternal Grandmother   . Stroke Paternal Grandfather       Review of Systems    General:  No chills, fever, night sweats or  weight changes.  Cardiovascular:  No dyspnea on exertion, edema, orthopnea, palpitations, paroxysmal nocturnal dyspnea. Positive for chest pain.  Dermatological: No rash, lesions/masses Respiratory: No cough, dyspnea Urologic: No hematuria, dysuria Abdominal:   No diarrhea, bright red blood per rectum, melena, or hematemesis. Positive for nausea and vomiting.  Neurologic:  No visual changes or changes in mental status. Positive for lower extremity weakness and frequent mechanical falls.   All other systems reviewed and are otherwise negative except as noted above.  Physical Exam/Data    Vitals:   11/02/18 2100 11/03/18 0044 11/03/18 0450 11/03/18 0451  BP: (!) 145/69 (!) 105/56 (!) 121/45 138/64  Pulse: 60 (!) 57 (!) 57 (!) 55  Resp: 18 18 18    Temp:  97.7 F (36.5 C) (!) 97.4 F (36.3 C)   TempSrc:  Oral Oral   SpO2: 99% 96% 96%   Weight:      Height:        Intake/Output Summary (Last 24 hours) at 11/03/2018 1039 Last data filed at 11/02/2018 2300 Gross per 24 hour  Intake 240 ml  Output -  Net 240 ml   Filed Weights   11/02/18 1450  Weight: 86.2 kg   Body mass index is 28.06 kg/m.   General: Pleasant, Caucasian male appearing in NAD Psych: Normal affect. Neuro: Alert and oriented X 3. Moves all extremities spontaneously. HEENT: Normal  Neck: Supple without bruits or JVD. Lungs:  Resp regular and unlabored, CTA without wheezing or rales. Heart: RRR no s3, s4, or murmurs. Abdomen: Soft, non-tender, non-distended, BS + x 4.  Extremities: No clubbing. Trace lower extremity edema bilaterally. DP/PT/Radials 2+ and equal bilaterally.   Labs/Studies     Relevant CV Studies:  Echocardiogram: 12/2017 Study Conclusions  - Left ventricle: The cavity size was normal. Wall thickness was   normal. Systolic function was normal. The estimated ejection   fraction was in the range of 50%  to 55%. Wall motion was normal;   there were no regional wall motion abnormalities.  Doppler   parameters are consistent with abnormal left ventricular   relaxation (grade 1 diastolic dysfunction). - Aortic valve: There was mild regurgitation. Valve area (VTI):   2.42 cm^2. Valve area (Vmax): 2.32 cm^2. - Left atrium: The atrium was moderately dilated. - Technically adequate study.  Laboratory Data:  Chemistry Recent Labs  Lab 11/02/18 1504  NA 134*  K 3.5  CL 101  CO2 23  GLUCOSE 116*  BUN 12  CREATININE 1.25*  CALCIUM 8.9  GFRNONAA 58*  GFRAA >60  ANIONGAP 10    No results for input(s): PROT, ALBUMIN, AST, ALT, ALKPHOS, BILITOT in the last 168 hours. Hematology Recent Labs  Lab 11/02/18 1504  WBC 7.1  RBC 4.23  HGB 13.0  HCT 39.5  MCV 93.4  MCH 30.7  MCHC 32.9  RDW 13.8  PLT 144*   Cardiac Enzymes Recent Labs  Lab 11/02/18 1504 11/02/18 2058 11/03/18 0203 11/03/18 0828  TROPONINI <0.03 <0.03 <0.03 <0.03   No results for input(s): TROPIPOC in the last 168 hours.  BNPNo results for input(s): BNP, PROBNP in the last 168 hours.  DDimer No results for input(s): DDIMER in the last 168 hours.  Radiology/Studies:  Dg Chest 2 View  Result Date: 11/02/2018 CLINICAL DATA:  Chest pain EXAM: CHEST - 2 VIEW COMPARISON:  None. FINDINGS: The heart size and mediastinal contours are within normal limits. Both lungs are clear. The visualized skeletal structures are unremarkable. IMPRESSION: No active cardiopulmonary disease. Electronically Signed   By: Deatra Robinson M.D.   On: 11/02/2018 15:48     Assessment & Plan    1. Atypical Chest Pain - He has been experiencing symptoms for several years per his report. The episode yesterday started after he had frequent nausea and vomiting. Symptoms typically occur at rest or with activity and can last for hours at a time. Had nonobstructive CAD by catheterization in 2010 with low risk NST in 2014. - Cyclic enzymes have remained negative and EKG shows no acute ischemic changes.  An echocardiogram is pending to  assess EF and wall motion. - Given his atypical symptoms, would not anticipate further inpatient ischemic evaluation unless echocardiogram is abnormal. Will discuss with Dr. Purvis Sheffield but can likely pursue outpatient Lexiscan Myoview or Coronary CT.   2. Paroxysmal Atrial Fibrillation - noted on telemetry yesterday evening with spontaneous conversion back to NSR. Was unaware of his arrhythmia. Has maintained NSR this morning. Agree with holding Lopressor given his baseline bradycardia and rates were controlled when in atrial fibrillation.  - This patients CHA2DS2-VASc Score and unadjusted Ischemic Stroke Rate (% per year) is equal to 9.7 % stroke rate/year from a score of 6 (HTN, DM, Vascular, Age, CVA (2)). Given his elevated score, he would benefit from long-term anticoagulation but given his frequent falls (10+ within the past 3-4 months), he is at higher risk and does not seem inclined to begin anticoagulation at this time. Could consider a 30-day monitor as an outpatient to assess AF burden but I do not think this would change his candidacy for anticoagulation.   3. HTN - BP has overall been well-controlled at 105/45 - 156/82 within the past 24 hours. - continue Lisinopril 2.5mg  daily. Lopressor discontinued given bradycardia.   4. HLD - followed by PCP as an outpatient. Remains on Atorvastatin  daily.   5. Stage 3 CKD - creatinine 1.25 this AM. Close to baseline.  6. CVA due to Carotid Artery Embolism (in 2017) - dopplers in 2017 showed less than 50% stenosis bilaterally. Remains on ASA and statin.    For questions or updates, please contact [facility] Please consult [email or web address] for contact info under Cardiology/STEMI.  Signed, Ellsworth Lennox, PA-C 11/03/2018, 10:39 AM Pager: [phone]  The patient was seen and examined, and I agree with the history, physical exam, assessment and plan as documented above, with modifications as noted below. I have also personally  reviewed all relevant documentation, old records, labs, and both radiographic and cardiovascular studies. I have also independently interpreted old and new ECG's.  Briefly, this is a 73 year old male with a history of nonobstructive coronary disease, CVA, paroxysmal atrial tachycardia, chronic kidney disease stage III, type 2 diabetes mellitus, hypertension, hyperlipidemia, with a long history of chest pain.  Cardiac catheterization showed nonobstructive disease in 2010 and he underwent a low risk nuclear stress test in 2014.  I spoke with the patient and his ex-wife today.  It appears he began developing nausea and vomiting at home yesterday and then developed chest pain.  His ex-wife said she thought he had pain even before he began vomiting.  The initial episode lasted 30 minutes and spontaneously subsided and then recurred.  Prior to this he denies exertional chest pain and dyspnea.  He has been falling quite a bit and his ex-wife says this has been an issue ever since his strokes.  Troponins have been normal.  Creatinine is 1.25.  Platelets are 144.  Chest x-ray showed no active cardiopulmonary disease.  I personally reviewed ECG which demonstrates sinus bradycardia with first-degree AV block, 54 bpm, PR 214 ms.  I reviewed telemetry which indicated a brief episode of atrial fibrillation which spontaneously resolved.  Symptoms sound atypical for coronary artery disease.  I do not plan to pursue inpatient ischemic testing.  A coronary CT angiogram could be considered in the outpatient setting.  I will defer to his primary cardiologist.  With respect to paroxysmal atrial fibrillation, he is bradycardic and thus is not a candidate for AV nodal blocking agents.  Given his frequent falls, he is a suboptimal candidate for systemic anticoagulation.  No further recommendations at this time.   Prentice Docker, MD, [facility]  11/03/2018 11:39 AM

## 2018-11-03 NOTE — Discharge Summary (Signed)
Physician Discharge Summary  [name] QHK:[id number] DOB: [birth year] DOA: 11/02/2018  PCP: Benita Stabile, MD Cardiologist: Dr. Wyline Mood  Admit date: 11/02/2018 Discharge date: 11/03/2018  Admitted From: Home  Disposition: Home   Recommendations for Outpatient Follow-up:  1. Follow up with PCP in 1 weeks 2. Follow up with Dr. Wyline Mood in 2 weeks 3. Please follow up 2D echocardiogram results from 11/03/18.   Discharge Condition: STABLE   CODE STATUS: FULL    Brief Hospitalization Summary: Please see all hospital notes, images, labs for full details of the hospitalization. Dr. Valora Piccolo HPI:  [name]  is a 73 y.o. male, with history of stroke secondary to left carotid artery embolism with residual lower extremity weakness, nonobstructive CAD as per cardiac cath in 2010 and negative stress test in 2014, hypertension, anxiety, hypothyroidism, type 2 diabetes mellitus with CKD stage III, hypertension, hyperlipidemia, anxiety and history of alcoholism (quit few years back), remote tobacco use who was brought to the ED by family with left-sided chest pain since last night.  Patient reports having sharp left-sided chest pain while lying in bed 7-8/10 in severity lasting for several minutes, resolving and then reoccurring throughout the night.  He took some nitroglycerin without much relief.  He reports shortness of breath associated with it.  Denies any chest trauma, palpitations, PND.  Denies any headache, blurred vision, dizziness, abdominal pain, dysuria, diarrhea, new weakness, tingling or numbness in his extremities.  Denies any fevers or chills, any sick contact or recent travel.  Denies any recent change in his medication.  In the ED he was bradycardic to high 40s, stable blood pressure, respiratory rate, normal temperature and O2 sat on room air.  Blood work showed normal CBC except for platelet of 144, sodium of 134, creatinine of 1.25, initial troponin was negative and glucose of  116.   Chest x-ray unremarkable.  EKG showed sinus bradycardia with first-degree AV block.  Patient given sublingual nitrate, full dose aspirin, IV fentanyl and Maalox.  Right before I saw him he had an episode of vomiting of food particles and also developed a macular rash in his right cheek.  Patient reports his chest pain to be about 2/10 after getting treatment in the ED.  Hospitalist consulted for observation on telemetry for chest pain rule out.  The patient was admitted for observation for chest pain symptoms.  His symptoms were mostly atypical.  He had serial troponin tests done and they have been negative x4.  His symptoms have resolved.  He was seen by the inpatient cardiology services and they have evaluated him and ordered a 2D echocardiogram.  They do not plan any further inpatient ischemic work-up at this time.  They have discussed the possibility of an outpatient area CT angiogram that could be considered at as an outpatient.   He was thought to be a suboptimal candidate for systemic anticoagulation given his frequent falls.  He has been bradycardic and was not a candidate for AV nodal blocking agents.  He should follow-up with his primary cardiologist Dr. Wyline Mood to review further work-up.  Also recommended he follow-up with his primary care provider next week to review echocardiogram results.  The patient is being discharged home in stable condition with close outpatient follow-up recommended.  His wife was at the bedside and she was also updated and verbalized understanding in addition to the patient.  ischarge Diagnoses:  Principal Problem:   Chest pain at rest Active Problems:   HLD (hyperlipidemia)   Essential  hypertension   CAD, NATIVE VESSEL   Abnormality of gait   Stroke due to embolism of left carotid artery (HCC)   Hypothyroidism   Anxiety   CKD (chronic kidney disease) stage 3, GFR 30-59 ml/min (HCC)   Type 2 diabetes mellitus without complication [facility])   Discharge  Instructions: Discharge Instructions    Call MD for:  difficulty breathing, headache or visual disturbances   Complete by:  As directed    Call MD for:  persistant dizziness or light-headedness   Complete by:  As directed    Call MD for:  persistant nausea and vomiting   Complete by:  As directed    Call MD for:  severe uncontrolled pain   Complete by:  As directed    Increase activity slowly   Complete by:  As directed      Allergies as of 11/03/2018      Reactions   Morphine Itching, Rash   Sulfonamide Derivatives    REACTION: Rash      Medication List    TAKE these medications   ALPRAZolam 1 MG tablet Commonly known as:  XANAX Take 1 mg by mouth 2 (two) times daily.   aspirin 81 MG tablet Take 81 mg by mouth daily.   atorvastatin 20 MG tablet Commonly known as:  LIPITOR Take 20 mg by mouth daily.   clotrimazole 1 % cream Commonly known as:  LOTRIMIN Apply topically 2 (two) times daily.   gabapentin 600 MG tablet Commonly known as:  NEURONTIN Take 600 mg by mouth 3 (three) times daily.   glimepiride 2 MG tablet Commonly known as:  AMARYL Take 2 mg by mouth daily.   levothyroxine 100 MCG tablet Commonly known as:  SYNTHROID, LEVOTHROID Take 100 mcg by mouth daily.   lisinopril 2.5 MG tablet Commonly known as:  PRINIVIL,ZESTRIL Take 2.5 mg by mouth daily.   metoprolol tartrate 50 MG tablet Commonly known as:  LOPRESSOR Take 50 mg by mouth 2 (two) times daily.   nitroGLYCERIN 0.4 MG SL tablet Commonly known as:  NITROSTAT Place 0.4 mg under the tongue every 5 (five) minutes as needed for chest pain.   omega-3 acid ethyl esters 1 g capsule Commonly known as:  LOVAZA Take 2 capsules by mouth 2 (two) times daily.   omeprazole 40 MG capsule Commonly known as:  PRILOSEC Take 40 mg by mouth daily.   oxyCODONE-acetaminophen 10-325 MG tablet Commonly known as:  PERCOCET Take 1 tablet by mouth every 4 (four) hours as needed for pain.      Follow-up  Information    Branch, Dorothe Pea, MD. Schedule an appointment as soon as possible for a visit in 2 week(s).   Specialty:  Cardiology Why:  Hospital Follow Up  Contact information: [address] [place] [address] [phone]        Benita Stabile, MD. Schedule an appointment as soon as possible for a visit in 1 week(s).   Specialty:  Internal Medicine Why:  Hospital Follow Up  Contact information: [address] [place] [facility] [address] [phone]          Allergies  Allergen Reactions  . Morphine Itching and Rash  . Sulfonamide Derivatives     REACTION: Rash   Allergies as of 11/03/2018      Reactions   Morphine Itching, Rash   Sulfonamide Derivatives    REACTION: Rash      Medication List    TAKE these medications  ALPRAZolam 1 MG tablet Commonly known as:  XANAX Take 1 mg by mouth 2 (two) times daily.   aspirin 81 MG tablet Take 81 mg by mouth daily.   atorvastatin 20 MG tablet Commonly known as:  LIPITOR Take 20 mg by mouth daily.   clotrimazole 1 % cream Commonly known as:  LOTRIMIN Apply topically 2 (two) times daily.   gabapentin 600 MG tablet Commonly known as:  NEURONTIN Take 600 mg by mouth 3 (three) times daily.   glimepiride 2 MG tablet Commonly known as:  AMARYL Take 2 mg by mouth daily.   levothyroxine 100 MCG tablet Commonly known as:  SYNTHROID, LEVOTHROID Take 100 mcg by mouth daily.   lisinopril 2.5 MG tablet Commonly known as:  PRINIVIL,ZESTRIL Take 2.5 mg by mouth daily.   metoprolol tartrate 50 MG tablet Commonly known as:  LOPRESSOR Take 50 mg by mouth 2 (two) times daily.   nitroGLYCERIN 0.4 MG SL tablet Commonly known as:  NITROSTAT Place 0.4 mg under the tongue every 5 (five) minutes as needed for chest pain.   omega-3 acid ethyl esters 1 g capsule Commonly known as:  LOVAZA Take 2 capsules by mouth 2 (two) times daily.   omeprazole 40 MG capsule Commonly known as:  PRILOSEC Take 40 mg by mouth  daily.   oxyCODONE-acetaminophen 10-325 MG tablet Commonly known as:  PERCOCET Take 1 tablet by mouth every 4 (four) hours as needed for pain.       Procedures/Studies: Dg Chest 2 View  Result Date: 11/02/2018 CLINICAL DATA:  Chest pain EXAM: CHEST - 2 VIEW COMPARISON:  None. FINDINGS: The heart size and mediastinal contours are within normal limits. Both lungs are clear. The visualized skeletal structures are unremarkable. IMPRESSION: No active cardiopulmonary disease. Electronically Signed   By: Deatra Robinson M.D.   On: 11/02/2018 15:48      Subjective: Patient reports that his chest pain has completely resolved.  He has had no recurrence of symptoms.  He has no shortness of breath symptoms.  He was seen by cardiology earlier today and there are no plans for further inpatient testing.  Discharge Exam: Vitals:   11/03/18 0450 11/03/18 0451  BP: (!) 121/45 138/64  Pulse: (!) 57 (!) 55  Resp: 18   Temp: (!) 97.4 F (36.3 C)   SpO2: 96%    Vitals:   11/02/18 2100 11/03/18 0044 11/03/18 0450 11/03/18 0451  BP: (!) 145/69 (!) 105/56 (!) 121/45 138/64  Pulse: 60 (!) 57 (!) 57 (!) 55  Resp: Temp:  97.7 F (36.5 C) (!) 97.4 F (36.3 C)   TempSrc:  Oral Oral   SpO2: 99% 96% 96%   Weight:      Height:       General: Pt is alert, awake, not in acute distress Cardiovascular: normal S1/S2 +, bradycardic rate, no rubs, no gallops Respiratory: CTA bilaterally, no wheezing, no rhonchi Abdominal: Soft, NT, ND, bowel sounds + Extremities: no edema, no cyanosis   The results of significant diagnostics from this hospitalization (including imaging, microbiology, ancillary and laboratory) are listed below for reference.     Microbiology: No results found for this or any previous visit (from the past 240 hour(s)).   Labs: BNP (last 3 results) No results for input(s): BNP in the last 8760 hours. Basic Metabolic Panel: Recent Labs  Lab 11/02/18 1504  NA 134*  K 3.5   CL 101  CO2 23  GLUCOSE 116*  BUN  12  CREATININE 1.25*  CALCIUM 8.9   Liver Function Tests: No results for input(s): AST, ALT, ALKPHOS, BILITOT, PROT, ALBUMIN in the last 168 hours. No results for input(s): LIPASE, AMYLASE in the last 168 hours. No results for input(s): AMMONIA in the last 168 hours. CBC: Recent Labs  Lab 11/02/18 1504  WBC 7.1  HGB 13.0  HCT 39.5  MCV 93.4  PLT 144*   Cardiac Enzymes: Recent Labs  Lab 11/02/18 1504 11/02/18 2058 11/03/18 0203 11/03/18 0828  TROPONINI <0.03 <0.03 <0.03 <0.03   BNP: Invalid input(s): POCBNP CBG: Recent Labs  Lab 11/02/18 2119 11/03/18 0743 11/03/18 1119  GLUCAP 190* 91 93   D-Dimer No results for input(s): DDIMER in the last 72 hours. Hgb A1c No results for input(s): HGBA1C in the last 72 hours. Lipid Profile No results for input(s): CHOL, HDL, LDLCALC, TRIG, CHOLHDL, LDLDIRECT in the last 72 hours. Thyroid function studies No results for input(s): TSH, T4TOTAL, T3FREE, THYROIDAB in the last 72 hours.  Invalid input(s): FREET3 Anemia work up No results for input(s): VITAMINB12, FOLATE, FERRITIN, TIBC, IRON, RETICCTPCT in the last 72 hours. Urinalysis No results found for: COLORURINE, APPEARANCEUR, LABSPEC, PHURINE, GLUCOSEU, HGBUR, BILIRUBINUR, KETONESUR, PROTEINUR, UROBILINOGEN, NITRITE, LEUKOCYTESUR Sepsis Labs Invalid input(s): PROCALCITONIN,  WBC,  LACTICIDVEN Microbiology No results found for this or any previous visit (from the past 240 hour(s)).  Time coordinating discharge:    SIGNED:  Standley Dakins, MD  [facility] 11/03/2018, 1:00 PM How to contact the [facility] Attending or Consulting provider 7A - 7P or covering provider during after hours 7P -7A, for this patient?  1. Check the care team in [facility] and look for a) attending/consulting [facility] provider listed and b) the [facility] team listed 2. Log into [email or web address] and use [facility]'s universal password to access. If you do not have the  password, please contact the hospital operator. 3. Locate the [facility] provider you are looking for under [facility] and page to a number that you can be directly reached. 4. If you still have difficulty reaching the provider, please page the [facility] (Director on Call) for the Hospitalists listed on amion for assistance.

## 2018-11-03 NOTE — Discharge Instructions (Signed)
Nonspecific Chest Pain Chest pain can be caused by many different conditions. Some causes of chest pain can be life-threatening. These will require treatment right away. Serious causes of chest pain include:  Heart attack.  A tear in the body's main blood vessel.  Redness and swelling (inflammation) around your heart.  Blood clot in your lungs. Other causes of chest pain may not be so serious. These include:  Heartburn.  Anxiety or stress.  Damage to bones or muscles in your chest.  Lung infections. Chest pain can feel like:  Pain or discomfort in your chest.  Crushing, pressure, aching, or squeezing pain.  Burning or tingling.  Dull or sharp pain that is worse when you move, cough, or take a deep breath.  Pain or discomfort that is also felt in your back, neck, jaw, shoulder, or arm, or pain that spreads to any of these areas. It is hard to know whether your pain is caused by something that is serious or something that is not so serious. So it is important to see your doctor right away if you have chest pain. Follow these instructions at home: Medicines  Take over-the-counter and prescription medicines only as told by your doctor.  If you were prescribed an antibiotic medicine, take it as told by your doctor. Do not stop taking the antibiotic even if you start to feel better. Lifestyle   Rest as told by your doctor.  Do not use any products that contain nicotine or tobacco, such as cigarettes, e-cigarettes, and chewing tobacco. If you need help quitting, ask your doctor.  Do not drink alcohol.  Make lifestyle changes as told by your doctor. These may include: ? Getting regular exercise. Ask your doctor what activities are safe for you. ? Eating a heart-healthy diet. A diet and nutrition specialist (dietitian) can help you to learn healthy eating options. ? Staying at a healthy weight. ? Treating diabetes or high blood pressure, if needed. ? Lowering your stress.  Activities such as yoga and relaxation techniques can help. General instructions  Pay attention to any changes in your symptoms. Tell your doctor about them or any new symptoms.  Avoid any activities that cause chest pain.  Keep all follow-up visits as told by your doctor. This is important. You may need more testing if your chest pain does not go away. Contact a doctor if:  Your chest pain does not go away.  You feel depressed.  You have a fever. Get help right away if:  Your chest pain is worse.  You have a cough that gets worse, or you cough up blood.  You have very bad (severe) pain in your belly (abdomen).  You pass out (faint).  You have either of these for no clear reason: ? Sudden chest discomfort. ? Sudden discomfort in your arms, back, neck, or jaw.  You have shortness of breath at any time.  You suddenly start to sweat, or your skin gets clammy.  You feel sick to your stomach (nauseous).  You throw up (vomit).  You suddenly feel lightheaded or dizzy.  You feel very weak or tired.  Your heart starts to beat fast, or it feels like it is skipping beats. These symptoms may be an emergency. Do not wait to see if the symptoms will go away. Get medical help right away. Call your local emergency services (911 in the [place] Do not drive yourself to the hospital. Summary  Chest pain can be caused by many different conditions. The  cause may be serious and need treatment right away. If you have chest pain, see your doctor right away.  Follow your doctor's instructions for taking medicines and making lifestyle changes.  Keep all follow-up visits as told by your doctor. This includes visits for any further testing if your chest pain does not go away.  Be sure to know the signs that show that your condition has become worse. Get help right away if you have these symptoms. This information is not intended to replace advice given to you by your health care provider. Make  sure you discuss any questions you have with your health care provider. Document Released: 01/26/2008 Document Revised: 02/09/2018 Document Reviewed: 02/09/2018 Elsevier Interactive Patient Education  2019 [employer] Inc[employer]     IMPORTANT INFORMATION: PAY CLOSE ATTENTION   PHYSICIAN DISCHARGE INSTRUCTIONS  Follow with Primary care provider  Benita Stabile, MD  and other consultants as instructed your Hospitalist Physician  SEEK MEDICAL CARE OR RETURN TO EMERGENCY ROOM IF SYMPTOMS COME BACK, WORSEN OR NEW PROBLEM DEVELOPS.   Please note: You were cared for by a hospitalist during your hospital stay. Every effort will be made to forward records to your primary care provider.  You can request that your primary care provider send for your hospital records if they have not received them.  Once you are discharged, your primary care physician will handle any further medical issues. Please note that NO REFILLS for any discharge medications will be authorized once you are discharged, as it is imperative that you return to your primary care physician (or establish a relationship with a primary care physician if you do not have one) for your post hospital discharge needs so that they can reassess your need for medications and monitor your lab values.  Please get a complete blood count and chemistry panel checked by your Primary MD at your next visit, and again as instructed by your Primary MD.  Get Medicines reviewed and adjusted: Please take all your medications with you for your next visit with your Primary MD  Laboratory/radiological data: Please request your Primary MD to go over all hospital tests and procedure/radiological results at the follow up, please ask your primary care provider to get all Hospital records sent to his/her office.  In some cases, they will be blood work, cultures and biopsy results pending at the time of your discharge. Please request that your primary care provider follow up on  these results.  If you are diabetic, please bring your blood sugar readings with you to your follow up appointment with primary care.    Please call and make your follow up appointments as soon as possible.    Also Note the following: If you experience worsening of your admission symptoms, develop shortness of breath, life threatening emergency, suicidal or homicidal thoughts you must seek medical attention immediately by calling 911 or calling your MD immediately  if symptoms less severe.  You must read complete instructions/literature along with all the possible adverse reactions/side effects for all the Medicines you take and that have been prescribed to you. Take any new Medicines after you have completely understood and accpet all the possible adverse reactions/side effects.   Do not drive when taking Pain medications or sleeping medications (Benzodiazepines)  Do not take more than prescribed Pain, Sleep and Anxiety Medications. It is not advisable to combine anxiety,sleep and pain medications without talking with your primary care practitioner  Special Instructions: If you have smoked or chewed Tobacco  in the last 2 yrs please stop smoking, stop any regular Alcohol  and or any Recreational drug use.  Wear Seat belts while driving.

## 2018-11-03 NOTE — Progress Notes (Signed)
  Echocardiogram 2D Echocardiogram has been performed.  [name] [name] 11/03/2018, 1:07 PM

## 2018-11-03 NOTE — Progress Notes (Signed)
Nsg Discharge Note  Admit Date:  11/02/2018 Discharge date: 11/03/2018   [name] to be D/C'd Home per MD order.  AVS completed.  Copy for chart, and copy for patient signed, and dated. Patient/caregiver able to verbalize understanding.  Discharge Medication: Allergies as of 11/03/2018      Reactions   Morphine Itching, Rash   Sulfonamide Derivatives    REACTION: Rash      Medication List    TAKE these medications   ALPRAZolam 1 MG tablet Commonly known as:  XANAX Take 1 mg by mouth 2 (two) times daily.   aspirin 81 MG tablet Take 81 mg by mouth daily.   atorvastatin 20 MG tablet Commonly known as:  LIPITOR Take 20 mg by mouth daily.   clotrimazole 1 % cream Commonly known as:  LOTRIMIN Apply topically 2 (two) times daily.   gabapentin 600 MG tablet Commonly known as:  NEURONTIN Take 600 mg by mouth 3 (three) times daily.   glimepiride 2 MG tablet Commonly known as:  AMARYL Take 2 mg by mouth daily.   levothyroxine 100 MCG tablet Commonly known as:  SYNTHROID, LEVOTHROID Take 100 mcg by mouth daily.   lisinopril 2.5 MG tablet Commonly known as:  PRINIVIL,ZESTRIL Take 2.5 mg by mouth daily.   metoprolol tartrate 50 MG tablet Commonly known as:  LOPRESSOR Take 50 mg by mouth 2 (two) times daily.   nitroGLYCERIN 0.4 MG SL tablet Commonly known as:  NITROSTAT Place 0.4 mg under the tongue every 5 (five) minutes as needed for chest pain.   omega-3 acid ethyl esters 1 g capsule Commonly known as:  LOVAZA Take 2 capsules by mouth 2 (two) times daily.   omeprazole 40 MG capsule Commonly known as:  PRILOSEC Take 40 mg by mouth daily.   oxyCODONE-acetaminophen 10-325 MG tablet Commonly known as:  PERCOCET Take 1 tablet by mouth every 4 (four) hours as needed for pain.       Discharge Assessment: Vitals:   11/03/18 0450 11/03/18 0451  BP: (!) 121/45 138/64  Pulse: (!) 57 (!) 55  Resp: 18   Temp: (!) 97.4 F (36.3 C)   SpO2: 96%    Skin  clean, dry and intact without evidence of skin break down, no evidence of skin tears noted. IV catheter discontinued intact. Site without signs and symptoms of complications - no redness or edema noted at insertion site, patient denies c/o pain - only slight tenderness at site.  Dressing with slight pressure applied.  D/c Instructions-Education: Discharge instructions given to patient/family with verbalized understanding. D/c education completed with patient/family including follow up instructions, medication list, d/c activities limitations if indicated, with other d/c instructions as indicated by MD - patient able to verbalize understanding, all questions fully answered. Patient instructed to return to ED, call 911, or call MD for any changes in condition.  Patient escorted via WC, and D/C home via private auto.  Adair Laundry, RN 11/03/2018 1:52 PM

## 2018-11-06 DIAGNOSIS — E119 Type 2 diabetes mellitus without complications: Secondary | ICD-10-CM | POA: Diagnosis not present

## 2018-11-06 DIAGNOSIS — E782 Mixed hyperlipidemia: Secondary | ICD-10-CM | POA: Diagnosis not present

## 2018-11-06 DIAGNOSIS — N183 Chronic kidney disease, stage 3 (moderate): Secondary | ICD-10-CM | POA: Diagnosis not present

## 2018-11-06 DIAGNOSIS — I10 Essential (primary) hypertension: Secondary | ICD-10-CM | POA: Diagnosis not present

## 2018-11-06 DIAGNOSIS — E1122 Type 2 diabetes mellitus with diabetic chronic kidney disease: Secondary | ICD-10-CM | POA: Diagnosis not present

## 2018-11-08 DIAGNOSIS — E1142 Type 2 diabetes mellitus with diabetic polyneuropathy: Secondary | ICD-10-CM | POA: Diagnosis not present

## 2018-11-08 DIAGNOSIS — I10 Essential (primary) hypertension: Secondary | ICD-10-CM | POA: Diagnosis not present

## 2018-11-08 DIAGNOSIS — N183 Chronic kidney disease, stage 3 (moderate): Secondary | ICD-10-CM | POA: Diagnosis not present

## 2018-11-08 DIAGNOSIS — E1122 Type 2 diabetes mellitus with diabetic chronic kidney disease: Secondary | ICD-10-CM | POA: Diagnosis not present

## 2018-11-08 DIAGNOSIS — E782 Mixed hyperlipidemia: Secondary | ICD-10-CM | POA: Diagnosis not present

## 2018-11-15 DIAGNOSIS — M545 Low back pain: Secondary | ICD-10-CM | POA: Diagnosis not present

## 2018-11-15 DIAGNOSIS — I639 Cerebral infarction, unspecified: Secondary | ICD-10-CM | POA: Diagnosis not present

## 2018-11-15 DIAGNOSIS — N183 Chronic kidney disease, stage 3 (moderate): Secondary | ICD-10-CM | POA: Diagnosis not present

## 2018-12-06 DIAGNOSIS — G9009 Other idiopathic peripheral autonomic neuropathy: Secondary | ICD-10-CM | POA: Diagnosis not present

## 2018-12-06 DIAGNOSIS — E1122 Type 2 diabetes mellitus with diabetic chronic kidney disease: Secondary | ICD-10-CM | POA: Diagnosis not present

## 2018-12-06 DIAGNOSIS — G8929 Other chronic pain: Secondary | ICD-10-CM | POA: Diagnosis not present

## 2018-12-06 DIAGNOSIS — N189 Chronic kidney disease, unspecified: Secondary | ICD-10-CM | POA: Diagnosis not present

## 2018-12-06 DIAGNOSIS — E1140 Type 2 diabetes mellitus with diabetic neuropathy, unspecified: Secondary | ICD-10-CM | POA: Diagnosis not present

## 2018-12-16 DIAGNOSIS — I639 Cerebral infarction, unspecified: Secondary | ICD-10-CM | POA: Diagnosis not present

## 2018-12-16 DIAGNOSIS — N183 Chronic kidney disease, stage 3 (moderate): Secondary | ICD-10-CM | POA: Diagnosis not present

## 2018-12-16 DIAGNOSIS — M545 Low back pain: Secondary | ICD-10-CM | POA: Diagnosis not present

## 2018-12-19 ENCOUNTER — Encounter: Payer: Self-pay | Admitting: Cardiology

## 2018-12-19 ENCOUNTER — Telehealth (INDEPENDENT_AMBULATORY_CARE_PROVIDER_SITE_OTHER): Payer: Medicare Other | Admitting: Cardiology

## 2018-12-19 VITALS — BP 121/71 | HR 61 | Ht 69.0 in | Wt 185.0 lb

## 2018-12-19 DIAGNOSIS — R0789 Other chest pain: Secondary | ICD-10-CM | POA: Diagnosis not present

## 2018-12-19 DIAGNOSIS — I480 Paroxysmal atrial fibrillation: Secondary | ICD-10-CM | POA: Diagnosis not present

## 2018-12-19 DIAGNOSIS — R001 Bradycardia, unspecified: Secondary | ICD-10-CM

## 2018-12-19 MED ORDER — METOPROLOL TARTRATE 25 MG PO TABS: 25.0000 mg | ORAL_TABLET | Freq: Two times a day (BID) | ORAL | 1 refills | Status: DC

## 2018-12-19 NOTE — Progress Notes (Signed)
Virtual Visit via Telephone Note   This visit type was conducted due to national recommendations for restrictions regarding the COVID-19 Pandemic (e.g. social distancing) in an effort to limit this patient's exposure and mitigate transmission in our community.  Due to his co-morbid illnesses, this patient is at least at moderate risk for complications without adequate follow up.  This format is felt to be most appropriate for this patient at this time.  The patient did not have access to video technology/had technical difficulties with video requiring transitioning to audio format only (telephone).  All issues noted in this document were discussed and addressed.  No physical exam could be performed with this format.  Please refer to the patient's chart for his  consent to telehealth for [facility].   Evaluation Performed:  Follow-up visit  Date:  12/19/2018   ID:  [name], [name] 21, 1947, MRN [id number]  Patient Location: Home Provider Location: Home  PCP:  Benita Stabile, MD  Cardiologist:  Dina Rich, MD  Electrophysiologist:  None   Chief Complaint:  Bradycardia  History of Present Illness:    [name] is a 73 y.o. male seen for follow up of the following medical problems.    1. Atypical chest pain - extensive prior workup as reported below, including cath in 2010 and stress test 2014 - admit 10/2018 with negative enzymes and echo - no recent symptoms.     2. PAF - noted during 10/2018 admission - has not been on anticoag due to frequent falls. - no recent palpitaitons   3. Bradycardia - chronic according ot notes. 10/2018 admissoin had some rates to 40s and 50s - has had some recent dizziness. Can occur in any position. Can fall at times, last time few weeks ago. Reports some prior episodes of syncope some time ago.      Prevoiusly followed by [facility] cardiology. From notes had normal cardiac monitor 2016. echocardiogram October 2014  ejection fraction 50-55%, G1DD, mild left atrial margin, 1+ AR, 1+ TR, no significant pericardial effusion -Echocardiogram interpreted by Dr. Loletha Carrow [facility] office), September 2014, preserved ejection fraction, small pericardial effusion, 1+ MR, 2+ AR  -Event monitor September 2014 sinus rhythm, occasional PVCs, nonsustained PAT, and one 7 beat run of a wide QRS complex at 110-120 bpm, with minimal changes QRS axis, and QRS morphology not similar to documented PVCs.  2. Heart catheterization 2010 nonobstructive coronary disease  -exercise Cardiolite stress test negative for ischemia with ejection fraction 61% October 2014          The patient does not have symptoms concerning for COVID-19 infection (fever, chills, cough, or new shortness of breath).    Past Medical History:  Diagnosis Date  . Alcohol use   . Anxiety   . Chest pain   . Coronary artery disease    a. Nonobstructive CAD by cath 2010 with negative nuclear stress test 05/2013.  . Depression   . Diabetes mellitus   . Hyperlipidemia   . Hypertension   . Hypothyroidism   . Kidney problem    a. 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  . Mild aortic insufficiency   . PAT (paroxysmal atrial tachycardia) (HCC)   . Pericardial effusion    a. by echo 2014.  . Pulmonary nodules    resolved  . PVC's (premature ventricular contractions)   . Stroke (cerebrum) (HCC)   . 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  Wide-complex tachycardia (HCC)    a. per [name] note: Event monitor September 2014 sinus rhythm, occasional PVCs, nonsustained PAT, and one 7 beat run of a wide QRS complex at 110-120 bpm, with minimal changes QRS axis, and QRS morphology not similar to documented PVCs.    Past Surgical History:  Procedure Laterality Date  . BACK SURGERY  1996  . BREAST LUMPECTOMY Left 1979  . CIRCUMCISION  1980  . MANDIBLE SURGERY Right 1969     Current Meds  Medication Sig   . ALPRAZolam (XANAX) 1 MG tablet Take 1 mg by mouth 2 (two) times daily.   Marland Kitchen aspirin 81 MG tablet Take 81 mg by mouth daily.  Marland Kitchen atorvastatin (LIPITOR) 20 MG tablet Take 20 mg by mouth daily.   . clotrimazole (LOTRIMIN) 1 % cream Apply topically 2 (two) times daily.  Marland Kitchen gabapentin (NEURONTIN) 600 MG tablet Take 600 mg by mouth 3 (three) times daily.   Marland Kitchen glimepiride (AMARYL) 2 MG tablet Take 2 mg by mouth daily.   Marland Kitchen levothyroxine (SYNTHROID, LEVOTHROID) 100 MCG tablet Take 100 mcg by mouth daily.  Marland Kitchen lisinopril (PRINIVIL,ZESTRIL) 2.5 MG tablet Take 2.5 mg by mouth daily.  . nitroGLYCERIN (NITROSTAT) 0.4 MG SL tablet Place 0.4 mg under the tongue every 5 (five) minutes as needed for chest pain.   Marland Kitchen omega-3 acid ethyl esters (LOVAZA) 1 g capsule Take 2 capsules by mouth 2 (two) times daily.  Marland Kitchen omeprazole (PRILOSEC) 40 MG capsule Take 40 mg by mouth daily.  Marland Kitchen oxyCODONE-acetaminophen (PERCOCET) 10-325 MG tablet Take 1 tablet by mouth every 4 (four) hours as needed for pain.   . [DISCONTINUED] metoprolol (LOPRESSOR) 50 MG tablet Take 50 mg by mouth 2 (two) times daily.      Allergies:   Morphine and Sulfonamide derivatives   Social History   Tobacco Use  . Smoking status: Former Games developer  . Smokeless tobacco: Never Used  Substance Use Topics  . Alcohol use: Not Currently  . Drug use: No     Family Hx: The patient's family history includes Bladder Cancer in his father; Cancer in his maternal grandfather; Colon cancer in his brother; Diabetes in his father; Lung cancer in his mother; Stroke in his maternal grandmother, paternal grandfather, and paternal grandmother.  ROS:   Please see the history of present illness.     All other systems reviewed and are negative.   Prior CV studies:   The following studies were reviewed today:   10/2018 echo IMPRESSIONS    1. The left ventricle has normal systolic function with an ejection fraction of 60-65%. The cavity size was normal. There is  mild concentric left ventricular hypertrophy. Left ventricular diastolic Doppler parameters are consistent with impaired  relaxation. Indeterminate filling pressures No evidence of left ventricular regional wall motion abnormalities.  2. The right ventricle has normal systolic function. The cavity was normal. There is no increase in right ventricular wall thickness.  3. The tricuspid valve is grossly normal.  4. The aortic valve is tricuspid Aortic valve regurgitation is mild by color flow Doppler.  5. There is mild dilatation of the aortic root measuring 38 mm.  6. The interatrial septum was not well visualized.   Labs/Other Tests and Data Reviewed:    EKG:  na  Recent Labs: 03/08/2018: ALT 22 11/02/2018: BUN 12; Creatinine, Ser 1.25; Hemoglobin 13.0; Platelets 144; Potassium 3.5; Sodium 134   Recent Lipid Panel Lab Results  Component Value Date/Time   CHOL  08/31/2008 06:15  AM    189        ATP III CLASSIFICATION:  <200     mg/dL   Desirable  [id number]  mg/dL   Borderline High  >=962>=240    mg/dL   High          TRIG 952175 (H) 08/31/2008 06:15 AM   HDL 26 (L) 08/31/2008 06:15 AM   CHOLHDL 7.3 08/31/2008 06:15 AM   LDLCALC (H) 08/31/2008 06:15 AM    128        Total Cholesterol/HDL:CHD Risk Coronary Heart Disease Risk Table                     Men   Women  1/2 Average Risk   3.4   3.3  Average Risk       5.0   4.4  2 X Average Risk   9.6   7.1  3 X Average Risk  23.4   11.0        Use the calculated Patient Ratio above and the CHD Risk Table to determine the patient's CHD Risk.        ATP III CLASSIFICATION (LDL):  <100     mg/dL   Optimal  [id number]  mg/dL   Near or Above                    Optimal  130-159  mg/dL   Borderline  [id number]  mg/dL   High  >253>190     mg/dL   Very High    Wt Readings from Last 3 Encounters:  12/19/18 185 lb (83.9 kg)  11/02/18 190 lb (86.2 kg)  07/26/18 192 lb (87.1 kg)     Objective:    Vital Signs:  BP 121/71   Pulse 61   Ht 5\' 9"  (1.753  m)   Wt 185 lb (83.9 kg)   BMI 27.32 kg/m    Normal affect. Normal speech pattern and tone. Comfortable, no audible signs of SOB or wheezing.   ASSESSMENT & PLAN:    1. Bradycardia - chronic, unclear if playing a role in his symptoms - lower lopressor to 25mg  bid, lower further as needed. If ongoing symptoms over time consider cardiac monitor  2. PAF - has not been on anticoag due to recurrent falls - continue to monitor   3. Atypical chest pain - long history with  Negative workups in the past - no significant recent symptoms, conitnue to monitor.   COVID-19 Education: The signs and symptoms of COVID-19 were discussed with the patient and how to seek care for testing (follow up with PCP or arrange E-visit).  The importance of social distancing was discussed today.  Time:   Today, I have spent 20 minutes with the patient with telehealth technology discussing the above problems.     Medication Adjustments/Labs and Tests Ordered: Current medicines are reviewed at length with the patient today.  Concerns regarding medicines are outlined above.   Tests Ordered: No orders of the defined types were placed in this encounter.   Medication Changes: Meds ordered this encounter  Medications  . metoprolol tartrate (LOPRESSOR) 25 MG tablet    Sig: Take 1 tablet (25 mg total) by mouth 2 (two) times daily.    Dispense:  180 tablet    Refill:  1    DOSE DECREASE 12/19/18    Disposition:  Follow up 4 weeks  Signed, Dina RichBranch, Jonathan, MD  12/19/2018 2:07 PM    [facility]

## 2018-12-19 NOTE — Patient Instructions (Signed)
Your physician recommends that you schedule a follow-up appointment in: 1 MONTH WITH DR [facility] VIRTUAL   Your physician has recommended you make the following change in your medication:   DECREASE LOPRESSOR 25 MG TWICE DAILY   Thank you for choosing [facility]!!

## 2019-01-15 DIAGNOSIS — N183 Chronic kidney disease, stage 3 (moderate): Secondary | ICD-10-CM | POA: Diagnosis not present

## 2019-01-15 DIAGNOSIS — I639 Cerebral infarction, unspecified: Secondary | ICD-10-CM | POA: Diagnosis not present

## 2019-01-15 DIAGNOSIS — M545 Low back pain: Secondary | ICD-10-CM | POA: Diagnosis not present

## 2019-01-23 ENCOUNTER — Telehealth (INDEPENDENT_AMBULATORY_CARE_PROVIDER_SITE_OTHER): Payer: Medicare Other | Admitting: Cardiology

## 2019-01-23 ENCOUNTER — Encounter: Payer: Self-pay | Admitting: Cardiology

## 2019-01-23 VITALS — BP 154/74 | HR 68 | Ht 69.0 in | Wt 185.0 lb

## 2019-01-23 DIAGNOSIS — R001 Bradycardia, unspecified: Secondary | ICD-10-CM | POA: Diagnosis not present

## 2019-01-23 DIAGNOSIS — R42 Dizziness and giddiness: Secondary | ICD-10-CM

## 2019-01-23 NOTE — Patient Instructions (Signed)
Medication Instructions:  Continue all current medications.  Labwork: none  Testing/Procedures:  Your physician has recommended that you wear a 7 day event monitor. Event monitors are medical devices that record the heart's electrical activity. Doctors most often us these monitors to diagnose arrhythmias. Arrhythmias are problems with the speed or rhythm of the heartbeat. The monitor is a small, portable device. You can wear one while you do your normal daily activities. This is usually used to diagnose what is causing palpitations/syncope (passing out).  Office will contact with results via phone or letter.    Follow-Up: 3 months   Any Other Special Instructions Will Be Listed Below (If Applicable).  If you need a refill on your cardiac medications before your next appointment, please call your pharmacy.  

## 2019-01-23 NOTE — Progress Notes (Signed)
Virtual Visit via Video Note   This visit type was conducted due to national recommendations for restrictions regarding the COVID-19 Pandemic (e.g. social distancing) in an effort to limit this patient's exposure and mitigate transmission in our community.  Due to his co-morbid illnesses, this patient is at least at moderate risk for complications without adequate follow up.  This format is felt to be most appropriate for this patient at this time.  All issues noted in this document were discussed and addressed.  A limited physical exam was performed with this format.  Please refer to the patient's chart for his consent to telehealth for [facility].   Date:  01/23/2019   ID:  [name], DOB Jan 22, 1946, MRN [id number]  Patient Location: Home Provider Location: Home  PCP:  Benita StabileHall, John Z, MD  Cardiologist:  Dina RichBranch, Talal Fritchman, MD  Electrophysiologist:  None   Evaluation Performed:  Follow-Up Visit  Chief Complaint:  Dizziness  History of Present Illness:    Ronald KettleKenneth V Armstrong is a 73 y.o. male seen for follow up of the following medical problems. This is a focused visit on recent issues with dizziness and bradycardia   1. PAF - noted during 10/2018 admission - has not been on anticoag due to frequent falls. - he denies any recent symptoms   2. Bradycardia - chronic according ot notes. 10/2018 admissoin had some rates to 40s and 50s - has had some recent dizziness. Can occur in any position. Can fall at times, last time few weeks ago. Reports some prior episodes of syncope some time ago.   - dizziness somewhat improved since last visit, not resolved. Still with some falls at home, though less frequent. He is not able to describe the falls well to discern possible etoilogy - heart rates have improved to 60s by his home check   [facility]. From notes had normal cardiac monitor 2016. echocardiogram October 2014 ejection fraction 50-55%,  G1DD, mild left atrial margin, 1+ AR, 1+ TR, no significant pericardial effusion -[facility] [facility], preserved ejection fraction, small pericardial effusion, 1+ MR, 2+ AR  -Event monitor September 2014 sinus rhythm, occasional PVCs, nonsustained PAT, and one 7 beat run of a wide QRS complex at 110-120 bpm, with minimal changes QRS axis, and QRS morphology not similar to documented PVCs.  2. Heart catheterization 2010 nonobstructive coronary disease  -exercise Cardiolite stress test negative for ischemia with ejection fraction 61% October 2014   The patient does not have symptoms concerning for COVID-19 infection (fever, chills, cough, or new shortness of breath).    Past Medical History:  Diagnosis Date  . Alcohol use   . Anxiety   . Chest pain   . Coronary artery disease    a. Nonobstructive CAD by cath 2010 with negative nuclear stress test 05/2013.  . Depression   . Diabetes mellitus   . Hyperlipidemia   . Hypertension   . Hypothyroidism   . Kidney problem    a. 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  . Mild aortic insufficiency   . PAT (paroxysmal atrial tachycardia) (HCC)   . Pericardial effusion    a. by echo 2014.  . Pulmonary nodules    resolved  . PVC's (premature ventricular contractions)   . Stroke (cerebrum) (HCC)   . 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 Wide-complex tachycardia (HCC)    a. per  [name] note: Event monitor September 2014 sinus rhythm, occasional PVCs, nonsustained PAT, and one 7 beat run of a wide QRS complex at 110-120 bpm, with minimal changes QRS axis, and QRS morphology not similar to documented PVCs.    Past Surgical History:  Procedure Laterality Date  . BACK SURGERY  1996  . BREAST LUMPECTOMY Left 1979  . CIRCUMCISION  1980  . MANDIBLE SURGERY Right 1969     Current Meds  Medication Sig  . ALPRAZolam (XANAX) 1 MG tablet Take 1  mg by mouth 2 (two) times daily.   Marland Kitchen aspirin 81 MG tablet Take 81 mg by mouth daily.  Marland Kitchen atorvastatin (LIPITOR) 20 MG tablet Take 20 mg by mouth daily.   . clotrimazole (LOTRIMIN) 1 % cream Apply topically 2 (two) times daily.  Marland Kitchen gabapentin (NEURONTIN) 600 MG tablet Take 600 mg by mouth 3 (three) times daily.   Marland Kitchen glimepiride (AMARYL) 2 MG tablet Take 2 mg by mouth daily.   Marland Kitchen levothyroxine (SYNTHROID, LEVOTHROID) 100 MCG tablet Take 100 mcg by mouth daily.  Marland Kitchen lisinopril (PRINIVIL,ZESTRIL) 2.5 MG tablet Take 2.5 mg by mouth daily.  . metoprolol tartrate (LOPRESSOR) 25 MG tablet Take 1 tablet (25 mg total) by mouth 2 (two) times daily.  . nitroGLYCERIN (NITROSTAT) 0.4 MG SL tablet Place 0.4 mg under the tongue every 5 (five) minutes as needed for chest pain.   Marland Kitchen omega-3 acid ethyl esters (LOVAZA) 1 g capsule Take 2 capsules by mouth 2 (two) times daily.  Marland Kitchen omeprazole (PRILOSEC) 40 MG capsule Take 40 mg by mouth daily.  Marland Kitchen oxyCODONE-acetaminophen (PERCOCET) 10-325 MG tablet Take 1 tablet by mouth every 4 (four) hours as needed for pain.      Allergies:   Morphine and Sulfonamide derivatives   Social History   Tobacco Use  . Smoking status: Former Games developer  . Smokeless tobacco: Never Used  Substance Use Topics  . Alcohol use: Not Currently  . Drug use: No     Family Hx: The patient's family history includes Bladder Cancer in his father; Cancer in his maternal grandfather; Colon cancer in his brother; Diabetes in his father; Lung cancer in his mother; Stroke in his maternal grandmother, paternal grandfather, and paternal grandmother.  ROS:   Please see the history of present illness.    All other systems reviewed and are negative.   Prior CV studies:   The following studies were reviewed today:   Labs/Other Tests and Data Reviewed:    EKG:  na  Recent Labs: 03/08/2018: ALT 22 11/02/2018: BUN 12; Creatinine, Ser 1.25; Hemoglobin 13.0; Platelets 144; Potassium 3.5; Sodium 134    Recent Lipid Panel Lab Results  Component Value Date/Time   CHOL  08/31/2008 06:15 AM    189        ATP III CLASSIFICATION:  <200     mg/dL   Desirable  765-465  mg/dL   Borderline High  >=035    mg/dL   High          TRIG 465 (H) 08/31/2008 06:15 AM   HDL 26 (L) 08/31/2008 06:15 AM   CHOLHDL 7.3 08/31/2008 06:15 AM   LDLCALC (H) 08/31/2008 06:15 AM    128        Total Cholesterol/HDL:CHD Risk Coronary Heart Disease Risk Table                     Men   Women  1/2 Average Risk   3.4   3.3  Average Risk       5.0   4.4  2 X Average Risk   9.6   7.1  3 X Average Risk  23.4   11.0        Use the calculated Patient Ratio above and the CHD Risk Table to determine the patient's CHD Risk.        ATP III CLASSIFICATION (LDL):  <100     mg/dL   Optimal  960-454  mg/dL   Near or Above                    Optimal  130-159  mg/dL   Borderline  098-119  mg/dL   High  >147     mg/dL   Very High    Wt Readings from Last 3 Encounters:  01/23/19 185 lb (83.9 kg)  12/19/18 185 lb (83.9 kg)  11/02/18 190 lb (86.2 kg)     Objective:    Vital Signs:  BP (!) 154/74   Pulse 68   Ht  (1.753 m)   Wt 185 lb (83.9 kg)   BMI 27.32 kg/m    Normal affect. Normal speech pattern and tone. Comfortable, no apparent distress. No audible signs of SOB or wheezing ASSESSMENT & PLAN:    1. Bradycardia/Dizziness - chronic, unclear if playing a role in his symptoms - ongoing symptoms of dizziness and falls despite lowering lopressor, we will obtain a 7 day monitor to see if any significant arrhythmia may be the cause.        COVID-19 Education: The signs and symptoms of COVID-19 were discussed with the patient and how to seek care for testing (follow up with PCP or arrange E-visit).  The importance of social distancing was discussed today.  Time:   Today, I have spent 10 minutes with the patient with telehealth technology discussing the above problems.     Medication  Adjustments/Labs and Tests Ordered: Current medicines are reviewed at length with the patient today.  Concerns regarding medicines are outlined above.   Tests Ordered: No orders of the defined types were placed in this encounter.   Medication Changes: No orders of the defined types were placed in this encounter.   Disposition:  Follow up 3 months  Signed, Dina Rich, MD  01/23/2019 2:13 PM    [facility]

## 2019-01-31 ENCOUNTER — Encounter (INDEPENDENT_AMBULATORY_CARE_PROVIDER_SITE_OTHER): Payer: Medicare Other

## 2019-01-31 DIAGNOSIS — R001 Bradycardia, unspecified: Secondary | ICD-10-CM

## 2019-02-07 DIAGNOSIS — Z0000 Encounter for general adult medical examination without abnormal findings: Secondary | ICD-10-CM | POA: Diagnosis not present

## 2019-02-15 DIAGNOSIS — N183 Chronic kidney disease, stage 3 (moderate): Secondary | ICD-10-CM | POA: Diagnosis not present

## 2019-02-15 DIAGNOSIS — I639 Cerebral infarction, unspecified: Secondary | ICD-10-CM | POA: Diagnosis not present

## 2019-02-15 DIAGNOSIS — M545 Low back pain: Secondary | ICD-10-CM | POA: Diagnosis not present

## 2019-02-20 ENCOUNTER — Telehealth: Payer: Self-pay | Admitting: *Deleted

## 2019-02-20 DIAGNOSIS — R001 Bradycardia, unspecified: Secondary | ICD-10-CM

## 2019-02-20 NOTE — Telephone Encounter (Signed)
Pt voiced understanding - will place orders for referral and forward to schedulers - routed to pcp

## 2019-02-20 NOTE — Telephone Encounter (Signed)
-----   Message from Arnoldo Lenis, MD sent at 02/20/2019 12:48 PM EDT ----- Heart monitor shows some episodes of low heart rates and pauses, this may be playing a role in his symptoms. Needs EP referal to discuss further   Zandra Abts MD

## 2019-02-26 ENCOUNTER — Encounter: Payer: Self-pay | Admitting: Internal Medicine

## 2019-03-07 DIAGNOSIS — E785 Hyperlipidemia, unspecified: Secondary | ICD-10-CM | POA: Diagnosis not present

## 2019-03-07 DIAGNOSIS — E1140 Type 2 diabetes mellitus with diabetic neuropathy, unspecified: Secondary | ICD-10-CM | POA: Diagnosis not present

## 2019-03-07 DIAGNOSIS — E782 Mixed hyperlipidemia: Secondary | ICD-10-CM | POA: Diagnosis not present

## 2019-03-07 DIAGNOSIS — I10 Essential (primary) hypertension: Secondary | ICD-10-CM | POA: Diagnosis not present

## 2019-03-07 DIAGNOSIS — E1122 Type 2 diabetes mellitus with diabetic chronic kidney disease: Secondary | ICD-10-CM | POA: Diagnosis not present

## 2019-03-14 DIAGNOSIS — I10 Essential (primary) hypertension: Secondary | ICD-10-CM | POA: Diagnosis not present

## 2019-03-14 DIAGNOSIS — E1142 Type 2 diabetes mellitus with diabetic polyneuropathy: Secondary | ICD-10-CM | POA: Diagnosis not present

## 2019-03-14 DIAGNOSIS — E1122 Type 2 diabetes mellitus with diabetic chronic kidney disease: Secondary | ICD-10-CM | POA: Diagnosis not present

## 2019-03-14 DIAGNOSIS — N183 Chronic kidney disease, stage 3 (moderate): Secondary | ICD-10-CM | POA: Diagnosis not present

## 2019-03-14 DIAGNOSIS — I129 Hypertensive chronic kidney disease with stage 1 through stage 4 chronic kidney disease, or unspecified chronic kidney disease: Secondary | ICD-10-CM | POA: Diagnosis not present

## 2019-03-17 DIAGNOSIS — N183 Chronic kidney disease, stage 3 (moderate): Secondary | ICD-10-CM | POA: Diagnosis not present

## 2019-03-17 DIAGNOSIS — M545 Low back pain: Secondary | ICD-10-CM | POA: Diagnosis not present

## 2019-03-17 DIAGNOSIS — I639 Cerebral infarction, unspecified: Secondary | ICD-10-CM | POA: Diagnosis not present

## 2019-03-20 DIAGNOSIS — Z1211 Encounter for screening for malignant neoplasm of colon: Secondary | ICD-10-CM | POA: Diagnosis not present

## 2019-04-16 ENCOUNTER — Telehealth: Payer: Self-pay

## 2019-04-16 NOTE — Telephone Encounter (Signed)
Spoke with pt regarding appt on 04/18/19. Pt stated he will have to get his grandson help with setting up his MyChart account. Pt was advise to check his vitals prior to appt. Pt questions and concerns were address.

## 2019-04-17 DIAGNOSIS — M545 Low back pain: Secondary | ICD-10-CM | POA: Diagnosis not present

## 2019-04-17 DIAGNOSIS — N183 Chronic kidney disease, stage 3 (moderate): Secondary | ICD-10-CM | POA: Diagnosis not present

## 2019-04-17 DIAGNOSIS — I639 Cerebral infarction, unspecified: Secondary | ICD-10-CM | POA: Diagnosis not present

## 2019-04-18 ENCOUNTER — Telehealth (INDEPENDENT_AMBULATORY_CARE_PROVIDER_SITE_OTHER): Payer: Medicare Other | Admitting: Internal Medicine

## 2019-04-18 DIAGNOSIS — I480 Paroxysmal atrial fibrillation: Secondary | ICD-10-CM

## 2019-04-18 DIAGNOSIS — R001 Bradycardia, unspecified: Secondary | ICD-10-CM | POA: Diagnosis not present

## 2019-04-18 MED ORDER — METOPROLOL TARTRATE 25 MG PO TABS: 12.5000 mg | ORAL_TABLET | Freq: Two times a day (BID) | ORAL | 3 refills | Status: DC

## 2019-04-18 NOTE — Progress Notes (Signed)
Electrophysiology TeleHealth Note  Due to national recommendations of social distancing due to COVID 19, an audio telehealth visit is felt to be most appropriate for this patient at this time.  Verbal consent was obtained by me for the telehealth visit today.  The patient does not have capability for a virtual visit.  A phone visit is therefore required today.   Date:  04/18/2019   ID:  [name], DOB 09-Dec-1945, MRN [id number]  Location: patient's home  [facility]  Evaluation Performed: Follow-up visit  PCP:  Benita StabileHall, John Z, MD   Electrophysiologist:  Dr Johney FrameAllred Primary Cardiologist:  Dr Marcina MillardBrach Chief Complaint:  bradycardia  History of Present Illness:    Ronald KettleKenneth V Armstrong is a 73 y.o. male who presents via telehealth conferencing today.  He is referred by Dr Wyline MoodBranch for EP consultation regarding bradycardia.  He has a h/p afib.  He appears to be without symptoms.  He has had falls for which he is not on anticoagulation.  He has chronic bradycardia with heart rates 40s-50s with symptoms of dizziness and fatigue.  He has had several falls which may have been due to syncope/ presyncope.  He was evaluated by Dr Wyline MoodBranch and had a 7 day monitor placed. This documented average HR of 65 bpm but some bradycardia with post termination pauses from atrial flutter of up to 3.2 seconds.  He reports that when his heart rate is low that he feels "Like not doing anything" but when his heart rate is higher that he feels better. Today, he denies symptoms of palpitations, chest pain, shortness of breath,  lower extremity edema,  presyncope, or syncope.  His primary concern is with back pain. The patient is otherwise without complaint today.  The patient denies symptoms of fevers, chills, cough, or new SOB worrisome for COVID 19.  Past Medical History:  Diagnosis Date  . Alcohol use   . Anxiety   . Chest pain   . Coronary artery disease    a. Nonobstructive CAD by cath 2010  with negative nuclear stress test 05/2013.  . Depression   . Diabetes mellitus   . Hyperlipidemia   . Hypertension   . Hypothyroidism   . Kidney problem    a. 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  . Mild aortic insufficiency   . PAT (paroxysmal atrial tachycardia) (HCC)   . Pericardial effusion    a. by echo 2014.  . Pulmonary nodules    resolved  . PVC's (premature ventricular contractions)   . Stroke (cerebrum) (HCC)   . 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 Wide-complex tachycardia (HCC)    a. per [name] note: Event monitor September 2014 sinus rhythm, occasional PVCs, nonsustained PAT, and one 7 beat run of a wide QRS complex at 110-120 bpm, with minimal changes QRS axis, and QRS morphology not similar to documented PVCs.     Past Surgical History:  Procedure Laterality Date  . BACK SURGERY  1996  . BREAST LUMPECTOMY Left 1979  . CIRCUMCISION  1980  . MANDIBLE SURGERY Right 1969    Current Outpatient Medications  Medication Sig Dispense Refill  . ALPRAZolam (XANAX) 1 MG tablet Take 1 mg by mouth 2 (two) times daily.   2  . aspirin 81 MG tablet Take 81 mg by mouth daily.    Marland Kitchen. atorvastatin (LIPITOR) 20 MG tablet Take 20 mg by mouth daily.     .Marland Kitchen  clotrimazole (LOTRIMIN) 1 % cream Apply topically 2 (two) times daily. 30 g 0  . gabapentin (NEURONTIN) 600 MG tablet Take 600 mg by mouth 3 (three) times daily.   1  . glimepiride (AMARYL) 2 MG tablet Take 2 mg by mouth daily.     Marland Kitchen levothyroxine (SYNTHROID, LEVOTHROID) 100 MCG tablet Take 100 mcg by mouth daily.  3  . lisinopril (PRINIVIL,ZESTRIL) 2.5 MG tablet Take 2.5 mg by mouth daily.  2  . nitroGLYCERIN (NITROSTAT) 0.4 MG SL tablet Place 0.4 mg under the tongue every 5 (five) minutes as needed for chest pain.     Marland Kitchen omega-3 acid ethyl esters (LOVAZA) 1 g capsule Take 2 capsules by mouth 2 (two) times daily.  12  . omeprazole (PRILOSEC) 40 MG capsule Take 40 mg by mouth  daily.    Marland Kitchen oxyCODONE-acetaminophen (PERCOCET) 10-325 MG tablet Take 1 tablet by mouth every 4 (four) hours as needed for pain.   0  . metoprolol tartrate (LOPRESSOR) 25 MG tablet Take 1 tablet (25 mg total) by mouth 2 (two) times daily. 180 tablet 1   No current facility-administered medications for this visit.     Allergies:   Morphine and Sulfonamide derivatives   Social History:  The patient  reports that he has quit smoking. He has never used smokeless tobacco. He reports previous alcohol use. He reports that he does not use drugs.   Family History:  The patient's  family history includes Bladder Cancer in his father; Cancer in his maternal grandfather; Colon cancer in his brother; Diabetes in his father; Lung cancer in his mother; Stroke in his maternal grandmother, paternal grandfather, and paternal grandmother.   ROS:  Please see the history of present illness.   All other systems are personally reviewed and negative.    Exam:    Vital Signs:  There were no vitals taken for this visit.  Well sounding   Labs/Other Tests and Data Reviewed:    Recent Labs: 11/02/2018: BUN 12; Creatinine, Ser 1.25; Hemoglobin 13.0; Platelets 144; Potassium 3.5; Sodium 134   Wt Readings from Last 3 Encounters:  01/23/19 185 lb (83.9 kg)  12/19/18 185 lb (83.9 kg)  11/02/18 190 lb (86.2 kg)     Echo 11/03/2018- EF 60%, mild AI  ekg 11/03/2018- sinus bradycardia 54 bpm, PR 214 msec  ASSESSMENT & PLAN:    1. Sinus bradycardia with post termination pauses We discussed at length today.  I have advised that he consider PPM implantation. He is clear in his decision to avoid pacing at this time. I will therefore reduce metoprolol to 12mg  BID x 2 weeks then discontinue If bradycardia symptoms persist, he may be more willing to consider pacing then  2. Paroxysmal atrial fibrillation and atrial flutter Post termination pauses noted Not current felt to be a candidate for anticoagulation due to  falls.  Could consider anticoagulation once bradycardia is treated and falls are resolved Minimally symptomatic.  I do worry about post termination pauses.   Follow-up:  6 weeks with EP NP   Patient Risk:  after full review of this patients clinical status, I feel that they are at moderate risk at this time.  Today, I have spent 15 minutes with the patient with telehealth technology discussing arrhythmia management .    Army Fossa, MD  04/18/2019 10:39 AM     [facility] [address] [place] [address] [phone] (office) [phone] (fax)

## 2019-04-26 ENCOUNTER — Encounter: Payer: Self-pay | Admitting: Cardiology

## 2019-04-26 ENCOUNTER — Ambulatory Visit (INDEPENDENT_AMBULATORY_CARE_PROVIDER_SITE_OTHER): Payer: Medicare Other | Admitting: Cardiology

## 2019-04-26 ENCOUNTER — Encounter: Payer: Self-pay | Admitting: *Deleted

## 2019-04-26 ENCOUNTER — Telehealth: Payer: Self-pay | Admitting: Cardiology

## 2019-04-26 ENCOUNTER — Other Ambulatory Visit: Payer: Self-pay

## 2019-04-26 VITALS — BP 144/76 | HR 56 | Ht 69.0 in | Wt 188.0 lb

## 2019-04-26 DIAGNOSIS — R0789 Other chest pain: Secondary | ICD-10-CM

## 2019-04-26 DIAGNOSIS — R001 Bradycardia, unspecified: Secondary | ICD-10-CM

## 2019-04-26 DIAGNOSIS — I480 Paroxysmal atrial fibrillation: Secondary | ICD-10-CM

## 2019-04-26 NOTE — Telephone Encounter (Signed)
°  Precert needed for: Lexiscan   Location: [facility]    Date: Sept 9, 2020

## 2019-04-26 NOTE — Progress Notes (Signed)
Clinical Summary [name] is a 73 y.o.male  1. PAF - noted during 10/2018 admission - has not been on anticoag due to frequent falls. - he denies any recent symptoms   - no recent palpitations.   2. Bradycardia - chronicaccording ot notes. 10/2018 admissoin had some rates to 40s and 50s - heart monitor showed bradycardia and post termination pauses - seen by EP with recs for pacemaker, patient wanted to avoid - lopressor lowered to 12.5mg  bid x 2 weeks then to discontinue - dizziness is stable.     3.hest pain - extensive prior workup as reported below, including cath in 2010 and stress test 2014 - admit 10/2018 with negative enzymes and echo  - recent chest pain 2 days ago. Occurred at rest. Left arm and midchest, tightness. Mild, lasted just a few seconds.No other associated symptoms. Several episodes over the last few months.  -similar to prior chest pains    [facility]. From notes had normal cardiac monitor 2016.echocardiogram October 2014 ejection fraction 50-55%, G1DD, mild left atrial margin, 1+ AR, 1+ TR, no significant pericardial effusion -Echocardiogram interpreted by Dr. Loletha [facility]Bruner [facility]([place] [facility]), September 2014, preserved ejection fraction, small pericardial effusion, 1+ MR, 2+ AR  -Event monitor September 2014 sinus rhythm, occasional PVCs, nonsustained PAT, and one 7 beat run of a wide QRS complex at 110-120 bpm, with minimal changes QRS axis, and QRS morphology not similar to documented PVCs.  2. Heart catheterization 2010 nonobstructive coronary disease  -exercise Cardiolite stress test negative for ischemia with ejection fraction 61% October 2014  Past Medical History:  Diagnosis Date  . Alcohol use   . Anxiety   . Chest pain   . Coronary artery disease    a. Nonobstructive CAD by cath 2010 with negative nuclear stress test 05/2013.  . Depression   . Diabetes mellitus   . Hyperlipidemia   . Hypertension   .  Hypothyroidism   . Kidney problem    a. 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  . Mild aortic insufficiency   . PAT (paroxysmal atrial tachycardia) (HCC)   . Pericardial effusion    a. by echo 2014.  . Pulmonary nodules    resolved  . PVC's (premature ventricular contractions)   . Stroke (cerebrum) (HCC)   . 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 Wide-complex tachycardia (HCC)    a. per [name] note: Event monitor September 2014 sinus rhythm, occasional PVCs, nonsustained PAT, and one 7 beat run of a wide QRS complex at 110-120 bpm, with minimal changes QRS axis, and QRS morphology not similar to documented PVCs.      Allergies  Allergen Reactions  . Morphine Itching and Rash  . Sulfonamide Derivatives     REACTION: Rash     Current Outpatient Medications  Medication Sig Dispense Refill  . ALPRAZolam (XANAX) 1 MG tablet Take 1 mg by mouth 2 (two) times daily.   2  . aspirin 81 MG tablet Take 81 mg by mouth daily.    Marland Kitchen. atorvastatin (LIPITOR) 20 MG tablet Take 20 mg by mouth daily.     Marland Kitchen. buPROPion (WELLBUTRIN SR) 100 MG 12 hr tablet Take 1 tablet by mouth daily.    Marland Kitchen. gabapentin (NEURONTIN) 600 MG tablet Take 600 mg by mouth 3 (three) times daily.   1  . glimepiride (AMARYL) 2 MG tablet Take 2 mg by mouth daily.     .Marland Kitchen  levothyroxine (SYNTHROID) 150 MCG tablet Take 150 mcg by mouth daily.    Marland Kitchen lisinopril (PRINIVIL,ZESTRIL) 2.5 MG tablet Take 2.5 mg by mouth daily.  2  . metFORMIN (GLUCOPHAGE) 500 MG tablet Take 2 tablets by mouth every morning. & 1 tablet in the evening    . metoprolol tartrate (LOPRESSOR) 25 MG tablet Take 0.5 tablets (12.5 mg total) by mouth 2 (two) times daily. 180 tablet 3  . nitroGLYCERIN (NITROSTAT) 0.4 MG SL tablet Place 0.4 mg under the tongue every 5 (five) minutes as needed for chest pain.     Marland Kitchen omega-3 acid ethyl esters (LOVAZA) 1 g capsule Take 2 capsules by mouth 2 (two) times daily.  12  .  omeprazole (PRILOSEC) 40 MG capsule Take 40 mg by mouth daily.    Marland Kitchen oxyCODONE-acetaminophen (PERCOCET) 10-325 MG tablet Take 1 tablet by mouth every 4 (four) hours as needed for pain.   0   No current facility-administered medications for this visit.      Past Surgical History:  Procedure Laterality Date  . BACK SURGERY  1996  . BREAST LUMPECTOMY Left 1979  . CIRCUMCISION  1980  . MANDIBLE SURGERY Right 1969     Allergies  Allergen Reactions  . Morphine Itching and Rash  . Sulfonamide Derivatives     REACTION: Rash      Family History  Problem Relation Age of Onset  . Lung cancer Mother   . Diabetes Father   . Bladder Cancer Father   . Colon cancer Brother   . Stroke Maternal Grandmother   . Cancer Maternal Grandfather   . Stroke Paternal Grandmother   . Stroke Paternal Grandfather      Social History [name] reports that he has quit smoking. He has never used smokeless tobacco. [name] reports previous alcohol use.   Review of Systems CONSTITUTIONAL: No weight loss, fever, chills, weakness or fatigue.  HEENT: Eyes: No visual loss, blurred vision, double vision or yellow sclerae.No hearing loss, sneezing, congestion, runny nose or sore throat.  SKIN: No rash or itching.  CARDIOVASCULAR: per hpi RESPIRATORY: No shortness of breath, cough or sputum.  GASTROINTESTINAL: No anorexia, nausea, vomiting or diarrhea. No abdominal pain or blood.  GENITOURINARY: No burning on urination, no polyuria NEUROLOGICAL: No headache, dizziness, syncope, paralysis, ataxia, numbness or tingling in the extremities. No change in bowel or bladder control.  MUSCULOSKELETAL: No muscle, back pain, joint pain or stiffness.  LYMPHATICS: No enlarged nodes. No history of splenectomy.  PSYCHIATRIC: No history of depression or anxiety.  ENDOCRINOLOGIC: No reports of sweating, cold or heat intolerance. No polyuria or polydipsia.  Marland Kitchen   Physical Examination Vitals:   04/26/19 1110   BP: (!) 144/76  Pulse: (!) 56  SpO2: 97%   Filed Weights   04/26/19 1110  Weight: 188 lb (85.3 kg)    Gen: resting comfortably, no acute distress HEENT: no scleral icterus, pupils equal round and reactive, no palptable cervical adenopathy,  CV: RRR, no m/r/g no jvd Resp: Clear to auscultation bilaterally GI: abdomen is soft, non-tender, non-distended, normal bowel sounds, no hepatosplenomegaly MSK: extremities are warm, no edema.  Skin: warm, no rash Neuro:  no focal deficits Psych: appropriate affect   Diagnostic Studies 01/2019 event monitor  7 day event monitor. Date is available from 83% of planned monitored time  Min HR 52, Max HR 111, Avg HR 65  Sinus rhythm with episodes of aflutter with variable conduction, no significant RVR. Can have postconversion pause up to  3.2 seconds.  No symptoms reported    Assessment and Plan   1. Bradycardia - recent monitor with post termination pauses up to 3.2 seconds - seen by EP with recs for pacemaker, iniitally resistant but now he is in favor, will notify Dr Allred   2. PAF - has not been on anticoag due to recurrent falls - weaning and will stop beta blocker due to pauses   3. Chest pain - long history with  Negative workups in the past, last was 2014 - with recent increase in symptoms will plan for lexiscan.   F/u 3 months   Arnoldo Lenis, M.D.

## 2019-04-26 NOTE — Patient Instructions (Signed)
Your physician wants you to follow-up in: [place]   Your physician recommends that you continue on your current medications as directed. Please refer to the Current Medication list given to you today.  Your physician has requested that you have a lexiscan myoview. For further information please visit [email or web address]. Please follow instruction sheet, as given.  Thank you for choosing [facility]!!

## 2019-05-02 ENCOUNTER — Ambulatory Visit (HOSPITAL_COMMUNITY)
Admission: RE | Admit: 2019-05-02 | Discharge: 2019-05-02 | Disposition: A | Discharge status: Home / Self Care | Payer: Medicare Other | Source: Ambulatory Visit | Attending: Cardiology | Admitting: Cardiology

## 2019-05-02 ENCOUNTER — Encounter (HOSPITAL_COMMUNITY)
Admission: RE | Admit: 2019-05-02 | Discharge: 2019-05-02 | Disposition: A | Discharge status: Home / Self Care | Payer: Medicare Other | Source: Ambulatory Visit | Attending: Cardiology | Admitting: Cardiology

## 2019-05-02 ENCOUNTER — Other Ambulatory Visit: Payer: Self-pay

## 2019-05-02 ENCOUNTER — Encounter (HOSPITAL_COMMUNITY): Payer: Self-pay

## 2019-05-02 DIAGNOSIS — R0789 Other chest pain: Secondary | ICD-10-CM | POA: Diagnosis not present

## 2019-05-02 LAB — NM MYOCAR MULTI W/SPECT W/WALL MOTION / EF
LV dias vol: 95 mL (ref 62–150)
LV sys vol: 41 mL
Peak HR: 77 {beats}/min
RATE: 0.31
Rest HR: 54 {beats}/min
SDS: 1
SRS: 4
SSS: 5
TID: 1.13

## 2019-05-02 MED ORDER — TECHNETIUM TC 99M TETROFOSMIN IV KIT
10.0000 | PACK | Freq: Once | INTRAVENOUS | Status: AC | PRN
  Administered 2019-05-02: 11 via INTRAVENOUS

## 2019-05-02 MED ORDER — REGADENOSON 0.4 MG/5ML IV SOLN
INTRAVENOUS | Status: AC
  Administered 2019-05-02: 0.4 mg via INTRAVENOUS
  Filled 2019-05-02: qty 5

## 2019-05-02 MED ORDER — TECHNETIUM TC 99M TETROFOSMIN IV KIT
30.0000 | PACK | Freq: Once | INTRAVENOUS | Status: AC | PRN
  Administered 2019-05-02: 31 via INTRAVENOUS

## 2019-05-02 MED ORDER — SODIUM CHLORIDE 0.9% FLUSH
INTRAVENOUS | Status: AC
  Administered 2019-05-02: 10 mL via INTRAVENOUS
  Filled 2019-05-02: qty 10

## 2019-05-10 ENCOUNTER — Telehealth: Payer: Self-pay | Admitting: *Deleted

## 2019-05-10 NOTE — Telephone Encounter (Signed)
Pt aware - routed to pcp  

## 2019-05-10 NOTE — Telephone Encounter (Signed)
-----   Message from Arnoldo Lenis, MD sent at 05/09/2019 12:04 PM EDT ----- Normal stress test, no evidence of any new blockages in the heart   Zandra Abts MD

## 2019-05-14 ENCOUNTER — Telehealth: Payer: Self-pay

## 2019-05-16 ENCOUNTER — Telehealth: Payer: Medicare Other | Admitting: Internal Medicine

## 2019-05-16 ENCOUNTER — Encounter: Payer: Self-pay | Admitting: Internal Medicine

## 2019-05-16 ENCOUNTER — Other Ambulatory Visit: Payer: Self-pay

## 2019-05-16 VITALS — Ht 69.0 in | Wt 185.0 lb

## 2019-05-16 NOTE — Progress Notes (Signed)
No answer despite multiple calls.  Will reschedule

## 2019-05-18 DIAGNOSIS — I639 Cerebral infarction, unspecified: Secondary | ICD-10-CM | POA: Diagnosis not present

## 2019-05-18 DIAGNOSIS — N183 Chronic kidney disease, stage 3 (moderate): Secondary | ICD-10-CM | POA: Diagnosis not present

## 2019-05-18 DIAGNOSIS — M545 Low back pain: Secondary | ICD-10-CM | POA: Diagnosis not present

## 2019-06-05 ENCOUNTER — Telehealth: Payer: Self-pay | Admitting: Cardiology

## 2019-06-05 ENCOUNTER — Ambulatory Visit: Payer: Medicare Other | Admitting: Nurse Practitioner

## 2019-06-05 NOTE — Telephone Encounter (Signed)
Per Dr. Rayann Heman dictation on 04/18/19 -  ASSESSMENT & PLAN:    1. Sinus bradycardia with post termination pauses We discussed at length today.  I have advised that he consider PPM implantation. He is clear in his decision to avoid pacing at this time. I will therefore reduce metoprolol to 12mg  BID x 2 weeks then discontinue If bradycardia symptoms persist, he may be more willing to consider pacing then  [facility] notified of above.    Patient notified as well.  Stated he has ran out so that is why pharmacy was questioning.  Stated that he can not hear well & when he saw Dr. Rayann Heman in office he had the mask on.  States that he reads lips he was not understanding all the instructions clearly.    Has appointment with Dr. Wynonia Lawman at [facility] office on 06/11/2019.

## 2019-06-05 NOTE — Telephone Encounter (Signed)
[facility] called in regards to Lopressor 25mg . States that patient thinks he is suppose to cut them in half.  Please call [phone].

## 2019-06-08 NOTE — H&P (View-Only) (Signed)
PCP:  Benita Stabile, MD Primary Cardiologist: Dina Rich, MD Electrophysiologist: None   [name] is a 73 y.o. male with past medical history of sinus bradycardia with post termination pauses and paroxysmal afib/flutter who presents today for routine electrophysiology followup. They are seen for Dr. Johney Frame.   Since last being seen in our clinic, the patient reports doing about the same. He hasn't noticed any difference in his lightheadedness and near falls. He has not had a complete fall for nearly 2 months per his wife.  He gets very lightheadedness 3-4 times some days. If he is walking, he has to "catch" or steady himself on a nearby object. Walks with a cane.  He is now open to pacemaker implantation.   The patient feels that he is tolerating medications without difficulties and is otherwise without complaint today.   Past Medical History:  Diagnosis Date  . Alcohol use   . Anxiety   . Chest pain   . Coronary artery disease    a. Nonobstructive CAD by cath 2010 with negative nuclear stress test 05/2013.  . Depression   . Diabetes mellitus   . Hyperlipidemia   . Hypertension   . Hypothyroidism   . Kidney problem    a. 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  . Mild aortic insufficiency   . PAT (paroxysmal atrial tachycardia) (HCC)   . Pericardial effusion    a. by echo 2014.  . Pulmonary nodules    resolved  . PVC's (premature ventricular contractions)   . Stroke (cerebrum) (HCC)   . 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 Wide-complex tachycardia (HCC)    a. per [name] note: Event monitor September 2014 sinus rhythm, occasional PVCs, nonsustained PAT, and one 7 beat run of a wide QRS complex at 110-120 bpm, with minimal changes QRS axis, and QRS morphology not similar to documented PVCs.    Past Surgical History:  Procedure Laterality Date  . BACK SURGERY  1996  . BREAST LUMPECTOMY Left 1979  .  CIRCUMCISION  1980  . MANDIBLE SURGERY Right 1969    Current Outpatient Medications  Medication Sig Dispense Refill  . ALPRAZolam (XANAX) 1 MG tablet Take 1 mg by mouth 2 (two) times daily.   2  . aspirin 81 MG tablet Take 81 mg by mouth daily.    Marland Kitchen atorvastatin (LIPITOR) 20 MG tablet Take 20 mg by mouth daily.     Marland Kitchen buPROPion (WELLBUTRIN SR) 100 MG 12 hr tablet Take 1 tablet by mouth daily.    Marland Kitchen gabapentin (NEURONTIN) 600 MG tablet Take 600 mg by mouth 3 (three) times daily.   1  . glimepiride (AMARYL) 2 MG tablet Take 2 mg by mouth daily.     Marland Kitchen levothyroxine (SYNTHROID) 150 MCG tablet Take 150 mcg by mouth daily.    Marland Kitchen lisinopril (PRINIVIL,ZESTRIL) 2.5 MG tablet Take 2.5 mg by mouth daily.  2  . metFORMIN (GLUCOPHAGE) 500 MG tablet Take 2 tablets by mouth every morning. & 1 tablet in the evening    . nitroGLYCERIN (NITROSTAT) 0.4 MG SL tablet Place 0.4 mg under the tongue every 5 (five) minutes as needed for chest pain.     Marland Kitchen omega-3 acid ethyl esters (LOVAZA) 1 g capsule Take 2 capsules by mouth 2 (two) times daily.  12  . omeprazole (PRILOSEC) 40 MG capsule Take 40 mg by mouth daily.    Marland Kitchen oxyCODONE-acetaminophen (PERCOCET)  10-325 MG tablet Take 1 tablet by mouth every 4 (four) hours as needed for pain.   0   No current facility-administered medications for this visit.     Allergies  Allergen Reactions  . Morphine Itching and Rash  . Sulfonamide Derivatives     REACTION: Rash    Social History   Socioeconomic History  . Marital status: Married    Spouse name: Mariann Laster  . Number of children: 3  . Years of education: 10  . Highest education level: Not on file  Occupational History  . Occupation: Retired  Scientific laboratory technician  . Financial resource strain: Not on file  . Food insecurity    Worry: Not on file    Inability: Not on file  . Transportation needs    Medical: Not on file    Non-medical: Not on file  Tobacco Use  . Smoking status: Former Research scientist (life sciences)  . Smokeless tobacco:  Never Used  Substance and Sexual Activity  . Alcohol use: Not Currently  . Drug use: No  . Sexual activity: Not on file  Lifestyle  . Physical activity    Days per week: Not on file    Minutes per session: Not on file  . Stress: Not on file  Relationships  . Social Herbalist on phone: Not on file    Gets together: Not on file    Attends religious service: Not on file    Active member of club or organization: Not on file    Attends meetings of clubs or organizations: Not on file    Relationship status: Not on file  . Intimate partner violence    Fear of current or ex partner: Not on file    Emotionally abused: Not on file    Physically abused: Not on file    Forced sexual activity: Not on file  Other Topics Concern  . Not on file  Social History Narrative   Lives at home w/ his wife   Right-handed   Caffeine: 2-3 cups of coffee each morning    Review of Systems: General: No chills, fever, night sweats or weight changes  Cardiovascular:  No chest pain, dyspnea on exertion, edema, orthopnea, palpitations, paroxysmal nocturnal dyspnea Dermatological: No rash, lesions or masses Respiratory: No cough, dyspnea Urologic: No hematuria, dysuria Abdominal: No nausea, vomiting, diarrhea, bright red blood per rectum, melena, or hematemesis Neurologic: No visual changes, weakness, changes in mental status All other systems reviewed and are otherwise negative except as noted above.  Physical Exam: Vitals:   06/11/19 1156  BP: 128/66  Pulse: 71  SpO2: 93%  Weight: 187 lb 9.6 oz (85.1 kg)  Height: 5\' 9"  (1.753 m)    GEN- The patient is well appearing, alert and oriented x 3 today.   HEENT: normocephalic, atraumatic; sclera clear, conjunctiva pink; hearing intact; oropharynx clear; neck supple, no JVP Lymph- no cervical lymphadenopathy Lungs- Clear to ausculation bilaterally, normal work of breathing.  No wheezes, rales, rhonchi Heart- Regular rate and rhythm, no  murmurs, rubs or gallops, PMI not laterally displaced GI- soft, non-tender, non-distended, bowel sounds present, no hepatosplenomegaly Extremities- no clubbing, cyanosis, or edema; DP/PT/radial pulses 2+ bilaterally MS- no significant deformity or atrophy Skin- warm and dry, no rash or lesion Psych- euthymic mood, full affect Neuro- strength and sensation are intact  EKG is not ordered. Personal review of EKG from 11/03/2018 shows Sinus brady at 54 bpm with PR interval of 214  Assessment and Plan: 1. Sinus  bradycardia with post termination pauses He has been recommended for PPM but has been hesitant.  We discussed the benefits and risks of PPM implantation including but not limited to bleeding, infection, and damage to the heart or lungs at length today. Pt and wife verbalized understanding and agree to proceed.   2. Paroxysmal atrial fib and atrial flutter As above, post termination pauses have been noted.  CHA2DS2/VASc is 6, but felt not to be OAC candidate with falls. Will consider OAC after device placed as our hope is that this will greatly improve his symptoms of lightheadedness and falls/near falls.    Pt now agreeable to PPM implantation. Will work with Dr. Allreds RN to schedule including COVID screening. Risks and benefits reviewed at length today.    Andrew , PA-C  06/11/19 2:25 PM  

## 2019-06-08 NOTE — Progress Notes (Signed)
PCP:  Benita Stabile, MD Primary Cardiologist: Dina Rich, MD Electrophysiologist: None   [name] is a 73 y.o. male with past medical history of sinus bradycardia with post termination pauses and paroxysmal afib/flutter who presents today for routine electrophysiology followup. They are seen for Dr. Johney Frame.   Since last being seen in our clinic, the patient reports doing about the same. He hasn't noticed any difference in his lightheadedness and near falls. He has not had a complete fall for nearly 2 months per his wife.  He gets very lightheadedness 3-4 times some days. If he is walking, he has to "catch" or steady himself on a nearby object. Walks with a cane.  He is now open to pacemaker implantation.   The patient feels that he is tolerating medications without difficulties and is otherwise without complaint today.   Past Medical History:  Diagnosis Date  . Alcohol use   . Anxiety   . Chest pain   . Coronary artery disease    a. Nonobstructive CAD by cath 2010 with negative nuclear stress test 05/2013.  . Depression   . Diabetes mellitus   . Hyperlipidemia   . Hypertension   . Hypothyroidism   . Kidney problem    a. 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  . Mild aortic insufficiency   . PAT (paroxysmal atrial tachycardia) (HCC)   . Pericardial effusion    a. by echo 2014.  . Pulmonary nodules    resolved  . PVC's (premature ventricular contractions)   . Stroke (cerebrum) (HCC)   . 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 Wide-complex tachycardia (HCC)    a. per [name] note: Event monitor September 2014 sinus rhythm, occasional PVCs, nonsustained PAT, and one 7 beat run of a wide QRS complex at 110-120 bpm, with minimal changes QRS axis, and QRS morphology not similar to documented PVCs.    Past Surgical History:  Procedure Laterality Date  . BACK SURGERY  1996  . BREAST LUMPECTOMY Left 1979  .  CIRCUMCISION  1980  . MANDIBLE SURGERY Right 1969    Current Outpatient Medications  Medication Sig Dispense Refill  . ALPRAZolam (XANAX) 1 MG tablet Take 1 mg by mouth 2 (two) times daily.   2  . aspirin 81 MG tablet Take 81 mg by mouth daily.    Marland Kitchen atorvastatin (LIPITOR) 20 MG tablet Take 20 mg by mouth daily.     Marland Kitchen buPROPion (WELLBUTRIN SR) 100 MG 12 hr tablet Take 1 tablet by mouth daily.    Marland Kitchen gabapentin (NEURONTIN) 600 MG tablet Take 600 mg by mouth 3 (three) times daily.   1  . glimepiride (AMARYL) 2 MG tablet Take 2 mg by mouth daily.     Marland Kitchen levothyroxine (SYNTHROID) 150 MCG tablet Take 150 mcg by mouth daily.    Marland Kitchen lisinopril (PRINIVIL,ZESTRIL) 2.5 MG tablet Take 2.5 mg by mouth daily.  2  . metFORMIN (GLUCOPHAGE) 500 MG tablet Take 2 tablets by mouth every morning. & 1 tablet in the evening    . nitroGLYCERIN (NITROSTAT) 0.4 MG SL tablet Place 0.4 mg under the tongue every 5 (five) minutes as needed for chest pain.     Marland Kitchen omega-3 acid ethyl esters (LOVAZA) 1 g capsule Take 2 capsules by mouth 2 (two) times daily.  12  . omeprazole (PRILOSEC) 40 MG capsule Take 40 mg by mouth daily.    Marland Kitchen oxyCODONE-acetaminophen (PERCOCET)  10-325 MG tablet Take 1 tablet by mouth every 4 (four) hours as needed for pain.   0   No current facility-administered medications for this visit.     Allergies  Allergen Reactions  . Morphine Itching and Rash  . Sulfonamide Derivatives     REACTION: Rash    Social History   Socioeconomic History  . Marital status: Married    Spouse name: Mariann Laster  . Number of children: 3  . Years of education: 10  . Highest education level: Not on file  Occupational History  . Occupation: Retired  Scientific laboratory technician  . Financial resource strain: Not on file  . Food insecurity    Worry: Not on file    Inability: Not on file  . Transportation needs    Medical: Not on file    Non-medical: Not on file  Tobacco Use  . Smoking status: Former Research scientist (life sciences)  . Smokeless tobacco:  Never Used  Substance and Sexual Activity  . Alcohol use: Not Currently  . Drug use: No  . Sexual activity: Not on file  Lifestyle  . Physical activity    Days per week: Not on file    Minutes per session: Not on file  . Stress: Not on file  Relationships  . Social Herbalist on phone: Not on file    Gets together: Not on file    Attends religious service: Not on file    Active member of club or organization: Not on file    Attends meetings of clubs or organizations: Not on file    Relationship status: Not on file  . Intimate partner violence    Fear of current or ex partner: Not on file    Emotionally abused: Not on file    Physically abused: Not on file    Forced sexual activity: Not on file  Other Topics Concern  . Not on file  Social History Narrative   Lives at home w/ his wife   Right-handed   Caffeine: 2-3 cups of coffee each morning    Review of Systems: General: No chills, fever, night sweats or weight changes  Cardiovascular:  No chest pain, dyspnea on exertion, edema, orthopnea, palpitations, paroxysmal nocturnal dyspnea Dermatological: No rash, lesions or masses Respiratory: No cough, dyspnea Urologic: No hematuria, dysuria Abdominal: No nausea, vomiting, diarrhea, bright red blood per rectum, melena, or hematemesis Neurologic: No visual changes, weakness, changes in mental status All other systems reviewed and are otherwise negative except as noted above.  Physical Exam: Vitals:   06/11/19 1156  BP: 128/66  Pulse: 71  SpO2: 93%  Weight: 187 lb 9.6 oz (85.1 kg)  Height: 5\' 9"  (1.753 m)    GEN- The patient is well appearing, alert and oriented x 3 today.   HEENT: normocephalic, atraumatic; sclera clear, conjunctiva pink; hearing intact; oropharynx clear; neck supple, no JVP Lymph- no cervical lymphadenopathy Lungs- Clear to ausculation bilaterally, normal work of breathing.  No wheezes, rales, rhonchi Heart- Regular rate and rhythm, no  murmurs, rubs or gallops, PMI not laterally displaced GI- soft, non-tender, non-distended, bowel sounds present, no hepatosplenomegaly Extremities- no clubbing, cyanosis, or edema; DP/PT/radial pulses 2+ bilaterally MS- no significant deformity or atrophy Skin- warm and dry, no rash or lesion Psych- euthymic mood, full affect Neuro- strength and sensation are intact  EKG is not ordered. Personal review of EKG from 11/03/2018 shows Sinus brady at 54 bpm with PR interval of 214  Assessment and Plan: 1. Sinus  bradycardia with post termination pauses He has been recommended for PPM but has been hesitant.  We discussed the benefits and risks of PPM implantation including but not limited to bleeding, infection, and damage to the heart or lungs at length today. Pt and wife verbalized understanding and agree to proceed.   2. Paroxysmal atrial fib and atrial flutter As above, post termination pauses have been noted.  CHA2DS2/VASc is 6, but felt not to be OAC candidate with falls. Will consider OAC after device placed as our hope is that this will greatly improve his symptoms of lightheadedness and falls/near falls.    Pt now agreeable to PPM implantation. Will work with Dr. Amedeo PlentyAllreds RN to schedule including COVID screening. Risks and benefits reviewed at length today.   Graciella FreerMichael Andrew Tillery, PA-C  06/11/19 2:25 PM

## 2019-06-11 ENCOUNTER — Other Ambulatory Visit: Payer: Self-pay

## 2019-06-11 ENCOUNTER — Encounter: Payer: Self-pay | Admitting: Student

## 2019-06-11 ENCOUNTER — Telehealth: Payer: Self-pay

## 2019-06-11 ENCOUNTER — Ambulatory Visit (INDEPENDENT_AMBULATORY_CARE_PROVIDER_SITE_OTHER): Payer: Medicare Other | Admitting: Student

## 2019-06-11 VITALS — BP 128/66 | HR 71 | Ht 69.0 in | Wt 187.6 lb

## 2019-06-11 DIAGNOSIS — R001 Bradycardia, unspecified: Secondary | ICD-10-CM | POA: Diagnosis not present

## 2019-06-11 NOTE — Telephone Encounter (Signed)
-----   Message from Mady Haagensen, [place] sent at 06/11/2019 12:16 PM EDT ----- Patient needs pacemaker with Allred for symptomatic bradycardia

## 2019-06-11 NOTE — Patient Instructions (Signed)
Medication Instructions:   Your physician recommends that you continue on your current medications as directed. Please refer to the Current Medication list given to you today.  *If you need a refill on your cardiac medications before your next appointment, please call your pharmacy*  Lab Work:  You will have labs drawn today: BMET and CBC  If you have labs (blood work) drawn today and your tests are completely normal, you will receive your results only by: Marland Kitchen MyChart Message (if you have MyChart) OR . A paper copy in the mail If you have any lab test that is abnormal or we need to change your treatment, we will call you to review the results.  Testing/Procedures:  Your physician has recommended that you have a pacemaker inserted. A pacemaker is a small device that is placed under the skin of your chest or abdomen to help control abnormal heart rhythms. This device uses electrical pulses to prompt the heart to beat at a normal rate. Pacemakers are used to treat heart rhythms that are too slow. Wire (leads) are attached to the pacemaker that goes into the chambers of you heart. This is done in the hospital and usually requires and overnight stay. Please see the instruction sheet given to you today for more information.   Follow-Up: At [facility], you and your health needs are our priority.  As part of our continuing mission to provide you with exceptional heart care, we have created designated Provider Care Teams.  These Care Teams include your primary Cardiologist (physician) and Advanced Practice Providers (APPs -  Physician Assistants and Nurse Practitioners) who all work together to provide you with the care you need, when you need it.  Your next appointment:    Follow up after you have your pacemaker placed.

## 2019-06-12 LAB — BASIC METABOLIC PANEL
BUN/Creatinine Ratio: 13 (ref 10–24)
BUN: 15 mg/dL (ref 8–27)
CO2: 22 mmol/L (ref 20–29)
Calcium: 9.8 mg/dL (ref 8.6–10.2)
Chloride: 105 mmol/L (ref 96–106)
Creatinine, Ser: 1.16 mg/dL (ref 0.76–1.27)
GFR calc Af Amer: 72 mL/min/{1.73_m2} (ref >59)
GFR calc non Af Amer: 63 mL/min/{1.73_m2} (ref >59)
Glucose: 56 mg/dL — ABNORMAL LOW (ref 65–99)
Potassium: 4.5 mmol/L (ref 3.5–5.2)
Sodium: 141 mmol/L (ref 134–144)

## 2019-06-12 LAB — CBC
Hematocrit: 39.1 % (ref 37.5–51.0)
Hemoglobin: 13.2 g/dL (ref 13.0–17.7)
MCH: 31.4 pg (ref 26.6–33.0)
MCHC: 33.8 g/dL (ref 31.5–35.7)
MCV: 93 fL (ref 79–97)
Platelets: 175 10*3/uL (ref 150–450)
RBC: 4.2 x10E6/uL (ref 4.14–5.80)
RDW: 13.4 % (ref 11.6–15.4)
WBC: 6.3 10*3/uL (ref 3.4–10.8)

## 2019-06-15 NOTE — Telephone Encounter (Signed)
Outreach made to Pt.  Pt not available.  Per wife please call back on Monday to discuss PPM implant.

## 2019-06-17 DIAGNOSIS — I639 Cerebral infarction, unspecified: Secondary | ICD-10-CM | POA: Diagnosis not present

## 2019-06-19 DIAGNOSIS — H5213 Myopia, bilateral: Secondary | ICD-10-CM | POA: Diagnosis not present

## 2019-06-19 DIAGNOSIS — E11319 Type 2 diabetes mellitus with unspecified diabetic retinopathy without macular edema: Secondary | ICD-10-CM | POA: Diagnosis not present

## 2019-06-20 NOTE — Telephone Encounter (Signed)
Received call from Pt.  Pt would like pacemaker implant on November 3  Pt scheduled for first case.  Pt has labs/soap  Gave verbal instructions  Scheduled for covid test on 10/30 at [facility]  Morning of procedure:  No am meds NPO Use scrub as directed  Work up complete

## 2019-06-22 ENCOUNTER — Other Ambulatory Visit: Payer: Self-pay

## 2019-06-22 ENCOUNTER — Other Ambulatory Visit (HOSPITAL_COMMUNITY)
Admission: RE | Admit: 2019-06-22 | Discharge: 2019-06-22 | Disposition: A | Discharge status: Home / Self Care | Payer: Medicare Other | Source: Ambulatory Visit | Attending: Internal Medicine | Admitting: Internal Medicine

## 2019-06-22 ENCOUNTER — Other Ambulatory Visit (HOSPITAL_COMMUNITY): Payer: Medicare Other

## 2019-06-22 DIAGNOSIS — Z01812 Encounter for preprocedural laboratory examination: Secondary | ICD-10-CM | POA: Insufficient documentation

## 2019-06-22 DIAGNOSIS — Z20828 Contact with and (suspected) exposure to other viral communicable diseases: Secondary | ICD-10-CM | POA: Insufficient documentation

## 2019-06-22 LAB — SARS CORONAVIRUS 2 (TAT 6-24 HRS): SARS Coronavirus 2: NEGATIVE

## 2019-06-26 ENCOUNTER — Other Ambulatory Visit: Payer: Self-pay

## 2019-06-26 ENCOUNTER — Ambulatory Visit (HOSPITAL_COMMUNITY)
Admission: RE | Admit: 2019-06-26 | Discharge: 2019-06-26 | Disposition: A | Discharge status: Home / Self Care | Payer: Medicare Other | Attending: Internal Medicine | Admitting: Internal Medicine

## 2019-06-26 ENCOUNTER — Encounter (HOSPITAL_COMMUNITY)
Admission: RE | Disposition: A | Discharge status: Home / Self Care | Payer: Self-pay | Source: Home / Self Care | Attending: Internal Medicine

## 2019-06-26 ENCOUNTER — Other Ambulatory Visit: Payer: Self-pay | Admitting: Cardiology

## 2019-06-26 ENCOUNTER — Ambulatory Visit (HOSPITAL_COMMUNITY): Payer: Medicare Other

## 2019-06-26 DIAGNOSIS — E119 Type 2 diabetes mellitus without complications: Secondary | ICD-10-CM | POA: Insufficient documentation

## 2019-06-26 DIAGNOSIS — Z950 Presence of cardiac pacemaker: Secondary | ICD-10-CM

## 2019-06-26 DIAGNOSIS — I495 Sick sinus syndrome: Secondary | ICD-10-CM

## 2019-06-26 DIAGNOSIS — I480 Paroxysmal atrial fibrillation: Secondary | ICD-10-CM | POA: Diagnosis not present

## 2019-06-26 DIAGNOSIS — Z8673 Personal history of transient ischemic attack (TIA), and cerebral infarction without residual deficits: Secondary | ICD-10-CM | POA: Insufficient documentation

## 2019-06-26 DIAGNOSIS — F329 Major depressive disorder, single episode, unspecified: Secondary | ICD-10-CM | POA: Insufficient documentation

## 2019-06-26 DIAGNOSIS — I10 Essential (primary) hypertension: Secondary | ICD-10-CM | POA: Diagnosis not present

## 2019-06-26 DIAGNOSIS — E785 Hyperlipidemia, unspecified: Secondary | ICD-10-CM | POA: Insufficient documentation

## 2019-06-26 DIAGNOSIS — I4892 Unspecified atrial flutter: Secondary | ICD-10-CM | POA: Insufficient documentation

## 2019-06-26 DIAGNOSIS — E039 Hypothyroidism, unspecified: Secondary | ICD-10-CM | POA: Diagnosis not present

## 2019-06-26 DIAGNOSIS — Z79890 Hormone replacement therapy (postmenopausal): Secondary | ICD-10-CM | POA: Insufficient documentation

## 2019-06-26 DIAGNOSIS — F419 Anxiety disorder, unspecified: Secondary | ICD-10-CM | POA: Insufficient documentation

## 2019-06-26 DIAGNOSIS — Z79899 Other long term (current) drug therapy: Secondary | ICD-10-CM | POA: Diagnosis not present

## 2019-06-26 DIAGNOSIS — R001 Bradycardia, unspecified: Secondary | ICD-10-CM | POA: Diagnosis not present

## 2019-06-26 DIAGNOSIS — Z7984 Long term (current) use of oral hypoglycemic drugs: Secondary | ICD-10-CM | POA: Insufficient documentation

## 2019-06-26 DIAGNOSIS — Z87891 Personal history of nicotine dependence: Secondary | ICD-10-CM | POA: Diagnosis not present

## 2019-06-26 DIAGNOSIS — Z885 Allergy status to narcotic agent status: Secondary | ICD-10-CM | POA: Diagnosis not present

## 2019-06-26 DIAGNOSIS — Z7982 Long term (current) use of aspirin: Secondary | ICD-10-CM | POA: Insufficient documentation

## 2019-06-26 DIAGNOSIS — I2510 Atherosclerotic heart disease of native coronary artery without angina pectoris: Secondary | ICD-10-CM | POA: Diagnosis not present

## 2019-06-26 HISTORY — PX: PACEMAKER IMPLANT: EP1218

## 2019-06-26 LAB — GLUCOSE, CAPILLARY: Glucose-Capillary: 77 mg/dL (ref 70–99)

## 2019-06-26 LAB — SURGICAL PCR SCREEN
MRSA, PCR: NEGATIVE
Staphylococcus aureus: POSITIVE — AB

## 2019-06-26 SURGERY — PACEMAKER IMPLANT

## 2019-06-26 MED ORDER — SODIUM CHLORIDE 0.9% FLUSH: 3.0000 mL | Freq: Two times a day (BID) | INTRAVENOUS | Status: DC

## 2019-06-26 MED ORDER — FENTANYL CITRATE (PF) 100 MCG/2ML IJ SOLN
INTRAMUSCULAR | Status: AC
  Filled 2019-06-26: qty 2

## 2019-06-26 MED ORDER — CHLORHEXIDINE GLUCONATE 4 % EX LIQD: 4.0000 "application " | Freq: Once | CUTANEOUS | Status: DC

## 2019-06-26 MED ORDER — IOHEXOL 350 MG/ML SOLN
INTRAVENOUS | Status: DC | PRN
  Administered 2019-06-26: 15 mL

## 2019-06-26 MED ORDER — OXYCODONE-ACETAMINOPHEN 5-325 MG PO TABS
ORAL_TABLET | ORAL | Status: AC
  Filled 2019-06-26: qty 2

## 2019-06-26 MED ORDER — SODIUM CHLORIDE 0.9% FLUSH: 3.0000 mL | INTRAVENOUS | Status: DC | PRN

## 2019-06-26 MED ORDER — HEPARIN (PORCINE) IN NACL 1000-0.9 UT/500ML-% IV SOLN
INTRAVENOUS | Status: DC | PRN
  Administered 2019-06-26: 500 mL

## 2019-06-26 MED ORDER — SODIUM CHLORIDE 0.9 % IV SOLN: 250.0000 mL | INTRAVENOUS | Status: DC | PRN

## 2019-06-26 MED ORDER — LIDOCAINE HCL (PF) 1 % IJ SOLN
INTRAMUSCULAR | Status: DC | PRN
  Administered 2019-06-26: 80 mL

## 2019-06-26 MED ORDER — CEFAZOLIN SODIUM-DEXTROSE 2-4 GM/100ML-% IV SOLN
INTRAVENOUS | Status: AC
  Filled 2019-06-26: qty 100

## 2019-06-26 MED ORDER — METFORMIN HCL 500 MG PO TABS: 500.0000 mg | ORAL_TABLET | ORAL | Status: DC

## 2019-06-26 MED ORDER — SODIUM CHLORIDE 0.9 % IV SOLN
INTRAVENOUS | Status: DC
  Administered 2019-06-26: 06:00:00 via INTRAVENOUS

## 2019-06-26 MED ORDER — MIDAZOLAM HCL 5 MG/5ML IJ SOLN
INTRAMUSCULAR | Status: AC
  Filled 2019-06-26: qty 5

## 2019-06-26 MED ORDER — OXYCODONE-ACETAMINOPHEN 5-325 MG PO TABS
2.0000 | ORAL_TABLET | Freq: Once | ORAL | Status: AC
  Administered 2019-06-26: 2 via ORAL

## 2019-06-26 MED ORDER — MUPIROCIN 2 % EX OINT
TOPICAL_OINTMENT | CUTANEOUS | Status: AC
  Administered 2019-06-26: 1 via TOPICAL
  Filled 2019-06-26: qty 22

## 2019-06-26 MED ORDER — SODIUM CHLORIDE 0.9 % IV SOLN
80.0000 mg | INTRAVENOUS | Status: AC
  Administered 2019-06-26: 80 mg
  Filled 2019-06-26: qty 2

## 2019-06-26 MED ORDER — CEFAZOLIN SODIUM-DEXTROSE 2-4 GM/100ML-% IV SOLN
2.0000 g | INTRAVENOUS | Status: AC
  Administered 2019-06-26: 2 g via INTRAVENOUS

## 2019-06-26 MED ORDER — MIDAZOLAM HCL 5 MG/5ML IJ SOLN
INTRAMUSCULAR | Status: DC | PRN
  Administered 2019-06-26 (×4): 1 mg via INTRAVENOUS

## 2019-06-26 MED ORDER — FENTANYL CITRATE (PF) 100 MCG/2ML IJ SOLN
INTRAMUSCULAR | Status: DC | PRN
  Administered 2019-06-26: 25 ug via INTRAVENOUS
  Administered 2019-06-26 (×2): 12.5 ug via INTRAVENOUS

## 2019-06-26 MED ORDER — MUPIROCIN 2 % EX OINT
1.0000 "application " | TOPICAL_OINTMENT | Freq: Once | CUTANEOUS | Status: AC
  Administered 2019-06-26: 06:00:00 1 via TOPICAL

## 2019-06-26 MED ORDER — ONDANSETRON HCL 4 MG/2ML IJ SOLN: 4.0000 mg | Freq: Four times a day (QID) | INTRAMUSCULAR | Status: DC | PRN

## 2019-06-26 MED ORDER — SODIUM CHLORIDE 0.9 % IV SOLN
INTRAVENOUS | Status: AC
  Filled 2019-06-26: qty 2

## 2019-06-26 MED ORDER — ACETAMINOPHEN 325 MG PO TABS
325.0000 mg | ORAL_TABLET | ORAL | Status: DC | PRN
  Filled 2019-06-26: qty 2

## 2019-06-26 SURGICAL SUPPLY — 7 items
CABLE SURGICAL S-101-97-12 (CABLE) ×3 IMPLANT
LEAD TENDRIL MRI 52CM LPA1200M (Lead) ×2 IMPLANT
LEAD TENDRIL MRI 58CM LPA1200M (Lead) ×2 IMPLANT
PACEMAKER ASSURITY DR-RF (Pacemaker) ×2 IMPLANT
PAD PRO RADIOLUCENT 2001M-C (PAD) ×3 IMPLANT
SHEATH 8FR PRELUDE SNAP 13 (SHEATH) ×4 IMPLANT
TRAY PACEMAKER INSERTION (PACKS) ×3 IMPLANT

## 2019-06-26 NOTE — Interval H&P Note (Signed)
History and Physical Interval Note:  06/26/2019 7:16 AM  Earleen Newport  has presented today for surgery, with the diagnosis of bradicardia.  The various methods of treatment have been discussed with the patient and family. After consideration of risks, benefits and other options for treatment, the patient has consented to  Procedure(s): PACEMAKER IMPLANT (N/A) as a surgical intervention.  The patient's history has been reviewed, patient examined, no change in status, stable for surgery.  I have reviewed the patient's chart and labs.  Questions were answered to the patient's satisfaction.    He continues to have fatigue, shortness of breath and dizziness.  Sinus bradycardia persists.  EF is preserved.  He requires rate controlling agents including metoprolol long term for afib rate control.  The patient has symptomatic sinus bradycardia.  I would therefore recommend pacemaker implantation at this time.  Risks, benefits, alternatives to pacemaker implantation were discussed in detail with the patient today. The patient understands that the risks include but are not limited to bleeding, infection, pneumothorax, perforation, tamponade, vascular damage, renal failure, MI, stroke, death,  and lead dislodgement and wishes to proceed.    Thompson Grayer

## 2019-06-26 NOTE — Discharge Instructions (Signed)
After Your Pacemaker   You have a St. Jude Pacemaker   Do not lift your arm above shoulder height for 1 week after your procedure. After 7 days, you may progress as below.     Tuesday July 03, 2019  Wednesday July 04, 2019 Thursday July 05, 2019 Friday July 06, 2019    Do not lift, push, pull, or carry anything over 10 pounds with the affected arm until 6 weeks (Tuesday August 07, 2019) after your procedure.    Do not drive until your wound check, or until instructed by your healthcare provider that you are safe to do so.    Monitor your pacemaker site for redness, swelling, and drainage. Call the device clinic at [phone] if you experience these symptoms or fever/chills.   If your incision is sealed with Steri-strips or staples, you may shower 7 days after your procedure. Do not remove the steri-strips or let the shower hit directly on your site. You may wash around your site with soap and water. If your incision is closed with Dermabond/Surgical glue. You may shower 1 day after your pacemaker implant and wash around the site with soap and water. Avoid lotions, ointments, or perfumes over your incision until it is well-healed.   You may use a hot tub or a pool AFTER your wound check appointment if the incision is completely closed.   Your Pacemaker may be MRI compatible. We will discuss this at your first follow up/wound check. .    Remote monitoring is used to monitor your pacemaker from home. This monitoring is scheduled every 91 days by our office. It allows [place] to keep an eye on the functioning of your device to ensure it is working properly. You will routinely see your Electrophysiologist annually (more often if necessary).    Pacemaker Implantation, Care After This sheet gives you information about how to care for yourself after your procedure. Your health care provider may also give you more specific instructions. If you have problems or questions,  contact your health care provider. What can I expect after the procedure? After the procedure, it is common to have:  Mild pain.  Slight bruising.  Some swelling over the incision.  A slight bump over the skin where the device was placed. Sometimes, it is possible to feel the device under the skin. This is normal.  You should received your Pacemaker ID card within 4-8 weeks. Follow these instructions at home: Medicines  Take over-the-counter and prescription medicines only as told by your health care provider.  If you were prescribed an antibiotic medicine, take it as told by your health care provider. Do not stop taking the antibiotic even if you start to feel better. Wound care     Do not remove the bandage on your chest until directed to do so by your health care provider.  After your bandage is removed, you may see pieces of tape called skin adhesive strips over the area where the cut was made (incision site). Let them fall off on their own.  Check the incision site every day to make sure it is not infected, bleeding, or starting to pull apart.  Do not use lotions or ointments near the incision site unless directed to do so.  Keep the incision area clean and dry for 7 days after the procedure or as directed by your health care provider. It takes several weeks for the incision site to completely heal.  Do not take baths, swim, or use  a hot tub for 7-10 days or as otherwise directed by your health care provider. Activity  Do not drive or use heavy machinery while taking prescription pain medicine.  Do not drive for 24 hours if you were given a medicine to help you relax (sedative).  Check with your health care provider before you start to drive or play sports.  Avoid sudden jerking, pulling, or chopping movements that pull your upper arm far away from your body. Avoid these movements for at least 6 weeks or as long as told by your health care provider.  Do not lift your  upper arm above your shoulders for at least 6 weeks or as long as told by your health care provider. This means no tennis, golf, or swimming.  You may go back to work when your health care provider says it is okay. Pacemaker care  You may be shown how to transfer data from your pacemaker through the phone to your health care provider.  Always let all health care providers know about your pacemaker before you have any medical procedures or tests.  Wear a medical ID bracelet or necklace stating that you have a pacemaker. Carry a pacemaker ID card with you at all times.  Your pacemaker battery will last for 5-15 years. Routine checks by your health care provider will let the health care provider know when the battery is starting to run down. The pacemaker will need to be replaced when the battery starts to run down.  Do not use amateur Chief of Staff. Other electrical devices are safe to use, including power tools, lawn mowers, and speakers. If you are unsure of whether something is safe to use, ask your health care provider.  When using your cell phone, hold it to the ear opposite the pacemaker. Do not leave your cell phone in a pocket over the pacemaker.  Avoid places or objects that have a strong electric or magnetic field, including: ? Airport Herbalist. When at the airport, let officials know that you have a pacemaker. ? Power plants. ? Large electrical generators. ? Radiofrequency transmission towers, such as cell phone and radio towers. General instructions  Weigh yourself every day. If you suddenly gain weight, fluid may be building up in your body.  Keep all follow-up visits as told by your health care provider. This is important. Contact a health care provider if:  You gain weight suddenly.  Your legs or feet swell.  It feels like your heart is fluttering or skipping beats (heart palpitations).  You have chills or a fever.  You have more  redness, swelling, or pain around your incisions.  You have more fluid or blood coming from your incisions.  Your incisions feel warm to the touch.  You have pus or a bad smell coming from your incisions. Get help right away if:  You have chest pain.  You have trouble breathing or are short of breath.  You become extremely tired.  You are light-headed or you faint. This information is not intended to replace advice given to you by your health care provider. Make sure you discuss any questions you have with your health care provider.

## 2019-06-26 NOTE — Progress Notes (Signed)
  Reviewed wound care and arm restrictions instructions with patient in room, and with his daughter separately over phone at [phone].  All questions answered. They know to call the office back with any additional questions.   Legrand Como [address] [place], PA-C  06/26/2019 9:53 AM

## 2019-06-27 ENCOUNTER — Encounter (HOSPITAL_COMMUNITY): Payer: Self-pay | Admitting: Internal Medicine

## 2019-07-03 DIAGNOSIS — H524 Presbyopia: Secondary | ICD-10-CM | POA: Diagnosis not present

## 2019-07-10 ENCOUNTER — Other Ambulatory Visit: Payer: Self-pay

## 2019-07-10 ENCOUNTER — Ambulatory Visit (INDEPENDENT_AMBULATORY_CARE_PROVIDER_SITE_OTHER): Payer: Medicare Other | Admitting: Student

## 2019-07-10 DIAGNOSIS — R001 Bradycardia, unspecified: Secondary | ICD-10-CM | POA: Diagnosis not present

## 2019-07-10 LAB — CUP PACEART INCLINIC DEVICE CHECK
Battery Remaining Longevity: 139 mo
Battery Voltage: 3.08 V
Brady Statistic RA Percent Paced: 1.4 %
Brady Statistic RV Percent Paced: 4.1 %
Date Time Interrogation Session: 20201117155701
Implantable Lead Implant Date: 20201103
Implantable Lead Implant Date: 20201103
Implantable Lead Location: 753859
Implantable Lead Location: 753860
Implantable Pulse Generator Implant Date: 20201103
Lead Channel Impedance Value: 450 Ohm
Lead Channel Impedance Value: 550 Ohm
Lead Channel Pacing Threshold Amplitude: 0.5 V
Lead Channel Pacing Threshold Amplitude: 0.5 V
Lead Channel Pacing Threshold Amplitude: 0.75 V
Lead Channel Pacing Threshold Amplitude: 0.75 V
Lead Channel Pacing Threshold Pulse Width: 0.5 ms
Lead Channel Pacing Threshold Pulse Width: 0.5 ms
Lead Channel Pacing Threshold Pulse Width: 0.5 ms
Lead Channel Pacing Threshold Pulse Width: 0.5 ms
Lead Channel Sensing Intrinsic Amplitude: 0.8 mV
Lead Channel Sensing Intrinsic Amplitude: 12 mV
Lead Channel Setting Pacing Amplitude: 3.5 V
Lead Channel Setting Pacing Amplitude: 3.5 V
Lead Channel Setting Pacing Pulse Width: 0.5 ms
Lead Channel Setting Sensing Sensitivity: 2 mV
Pulse Gen Model: 2272
Pulse Gen Serial Number: 9174806

## 2019-07-10 NOTE — Progress Notes (Addendum)
Wound check appointment. Steri-strips removed. Wound without redness or edema. Incision edges approximated, wound well healed. Normal device function. Thresholds and impedances consistent with implant measurements. RA sensing slightly low today at 0.8 mV. RA sensitivity changed from 0.5 mV to 0.4 mV. Device programmed at 3.5V/auto capture programmed on for extra safety margin until 3 month visit. Histogram distribution appropriate for patient and level of activity. Pt with known paroxysmal AF/AT, not on OAC due to previous falls and had planned to consider post-PPM implant. AF burden 33% since implant. No alerts received as patient had not yet plugged in his monitor. He will do so today. Patient educated about wound care, arm mobility, lifting restrictions. ROV in 3 months with Dr. Rayann Heman. Next remote 09/2019.    Will discuss timing of starting [place] with MD post PPM placement.  Pt CHA2DS2VASC of 6 with h/o previous stroke. Counseled on [facility] today, with risks and benefits discussed. Pt has not had any further falls, dizziness, or lightheadedness since PPM placement. Pt agreeable to start [facility] pending MD decision on timing.  He sees Dr. Harl Bowie 12/15, so discussion with patient was that we may want him to be started AT that visit, which would be about 6 weeks after his surgery, vs starting 1-2 weeks prior to that visit and re-assessing then.  [name] [address]" [place], PA-C  07/10/2019 3:55 PM

## 2019-07-11 NOTE — Progress Notes (Signed)
Im ok starting eliquis 5mg  bid now. Im not familiar with a waiting period needed after pacemaker placement, if Allred is ok with starting now then im fine   Zandra Abts MD

## 2019-07-18 ENCOUNTER — Telehealth: Payer: Self-pay | Admitting: *Deleted

## 2019-07-18 ENCOUNTER — Telehealth: Payer: Self-pay | Admitting: Student

## 2019-07-18 ENCOUNTER — Other Ambulatory Visit: Payer: Self-pay | Admitting: Student

## 2019-07-18 ENCOUNTER — Other Ambulatory Visit: Payer: Self-pay | Admitting: *Deleted

## 2019-07-18 DIAGNOSIS — I480 Paroxysmal atrial fibrillation: Secondary | ICD-10-CM

## 2019-07-18 DIAGNOSIS — I639 Cerebral infarction, unspecified: Secondary | ICD-10-CM | POA: Diagnosis not present

## 2019-07-18 MED ORDER — APIXABAN 5 MG PO TABS: 5.0000 mg | ORAL_TABLET | Freq: Two times a day (BID) | ORAL | 3 refills | Status: DC

## 2019-07-18 NOTE — Telephone Encounter (Signed)
Spoke with patient who verbalized understanding  he does not start Elquis 5 mg  Twice a day until 07-27-19 to be already started before visit to Dr. Harl Bowie 08-07-19.

## 2019-07-18 NOTE — Progress Notes (Signed)
See phone note

## 2019-07-18 NOTE — Telephone Encounter (Signed)
-----   Message from Shirley Friar, PA-C sent at 07/17/2019  2:57 PM EST ----- Spoke with Dr. Rayann Heman.  Can we plan on having this patient start Eliquis 5 mg BID on 07/27/2019, as long as he hasn't had any further falls?   That way he can be assessed when he sees Dr. Harl Bowie on the 15th.     Thank you!  Legrand Como [address] [place], PA-C  07/17/2019 2:58 PM    ----- Message ----- From: Arnoldo Lenis, MD Sent: 07/11/2019   1:37 PM EST To: Shirley Friar, PA-C    ----- Message ----- From: Annamaria Helling Sent: 07/10/2019   3:55 PM EST To: Arnoldo Lenis, MD  Pt with CHA2DS2VASC of 6; previously deferred [place] due to lightheadedness and falls.  No further since PPM placement 06/26/2019. Counseled pt on risks and benefits today of [place] including Xarelto and Eliquis, and he agreed to start, pending MD decision on timing post PPM placement.  Thank you!

## 2019-08-06 DIAGNOSIS — E1122 Type 2 diabetes mellitus with diabetic chronic kidney disease: Secondary | ICD-10-CM | POA: Diagnosis not present

## 2019-08-06 DIAGNOSIS — E1140 Type 2 diabetes mellitus with diabetic neuropathy, unspecified: Secondary | ICD-10-CM | POA: Diagnosis not present

## 2019-08-06 DIAGNOSIS — E039 Hypothyroidism, unspecified: Secondary | ICD-10-CM | POA: Diagnosis not present

## 2019-08-06 DIAGNOSIS — N183 Chronic kidney disease, stage 3 unspecified: Secondary | ICD-10-CM | POA: Diagnosis not present

## 2019-08-06 DIAGNOSIS — I10 Essential (primary) hypertension: Secondary | ICD-10-CM | POA: Diagnosis not present

## 2019-08-07 ENCOUNTER — Ambulatory Visit (INDEPENDENT_AMBULATORY_CARE_PROVIDER_SITE_OTHER): Payer: Medicare Other | Admitting: Cardiology

## 2019-08-07 ENCOUNTER — Other Ambulatory Visit: Payer: Self-pay

## 2019-08-07 ENCOUNTER — Encounter: Payer: Self-pay | Admitting: Cardiology

## 2019-08-07 VITALS — BP 121/68 | HR 82 | Ht 69.0 in | Wt 181.8 lb

## 2019-08-07 DIAGNOSIS — R001 Bradycardia, unspecified: Secondary | ICD-10-CM

## 2019-08-07 DIAGNOSIS — I480 Paroxysmal atrial fibrillation: Secondary | ICD-10-CM

## 2019-08-07 NOTE — Patient Instructions (Signed)
Your physician recommends that you schedule a follow-up appointment in: [place] has recommended you make the following change in your medication:   STOP ASPIRIN   STOP LISINOPRIL   Thank you for choosing [facility]!!

## 2019-08-07 NOTE — Progress Notes (Signed)
Clinical Summary [name] is a 73 y.o.male seen today for follow up of the following medical problems.   1. PAF - noted during 10/2018 admission - had not been on anticoag due to frequent falls previously.   - no recent palpitations - since pacemaker placement falls have resolved, started on eliquis 5mg  bid.   2. Bradycardia - chronicaccording ot notes. 10/2018 admissoin had some rates to 40s and 50s - heart monitor showed bradycardia and post termination pauses up to 3.2 seconds  06/26/19 pacemaker St Jude medical assurity dual chamber placed - 07/10/19 pacemaker normal function  - falls have improved since pacemaker. Some orthostatic symptoms still  3.Chest pain - extensive prior workup as reported below, including cath in 2010 and stress test 2014 - admit 10/2018 with negative enzymes and echo  No recent symptoms    [facility]. From notes had normal cardiac monitor 2016.echocardiogram October 2014 ejection fraction 50-55%, G1DD, mild left atrial margin, 1+ AR, 1+ TR, no significant pericardial effusion -Echocardiogram interpreted by Dr. Loletha CarrowBruner [facility]([place] [facility]), September 2014, preserved ejection fraction, small pericardial effusion, 1+ MR, 2+ AR  -Event monitor September 2014 sinus rhythm, occasional PVCs, nonsustained PAT, and one 7 beat run of a wide QRS complex at 110-120 bpm, with minimal changes QRS axis, and QRS morphology not similar to documented PVCs.  2. Heart catheterization 2010 nonobstructive coronary disease  -exercise Cardiolite stress test negative for ischemia with ejection fraction 61% October 2014      Past Medical History:  Diagnosis Date  . Alcohol use   . Anxiety   . Chest pain   . Coronary artery disease    a. Nonobstructive CAD by cath 2010 with negative nuclear stress test 05/2013.  . Depression   . Diabetes mellitus   . Hyperlipidemia   . Hypertension   . Hypothyroidism   . Kidney problem    a.  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  . Mild aortic insufficiency   . PAT (paroxysmal atrial tachycardia) (HCC)   . Pericardial effusion    a. by echo 2014.  . Pulmonary nodules    resolved  . PVC's (premature ventricular contractions)   . Stroke (cerebrum) (HCC)   . 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 Wide-complex tachycardia (HCC)    a. per [name] note: Event monitor September 2014 sinus rhythm, occasional PVCs, nonsustained PAT, and one 7 beat run of a wide QRS complex at 110-120 bpm, with minimal changes QRS axis, and QRS morphology not similar to documented PVCs.      Allergies  Allergen Reactions  . Morphine     Affected pts heart   . Sulfonamide Derivatives Rash    blistering     Current Outpatient Medications  Medication Sig Dispense Refill  . acetaminophen (TYLENOL) 500 MG tablet Take 500 mg by mouth every 8 (eight) hours as needed for moderate pain or headache.    . ALPRAZolam (XANAX) 1 MG tablet Take 1 mg by mouth 2 (two) times daily.   2  . apixaban (ELIQUIS) 5 MG TABS tablet Take 1 tablet (5 mg total) by mouth 2 (two) times daily. 60 tablet 3  . aspirin 81 MG tablet Take 81 mg by mouth daily.    Marland Kitchen. atorvastatin (LIPITOR) 20 MG tablet Take 20 mg by mouth daily.     . diphenhydrAMINE (BENADRYL) 25 MG tablet Take 25 mg by mouth daily  as needed for allergies.    Marland Kitchen gabapentin (NEURONTIN) 600 MG tablet Take 600 mg by mouth 3 (three) times daily.   1  . glimepiride (AMARYL) 2 MG tablet Take 2 mg by mouth daily.     Marland Kitchen levothyroxine (SYNTHROID) 150 MCG tablet Take 150 mcg by mouth daily.    Marland Kitchen lisinopril (PRINIVIL,ZESTRIL) 2.5 MG tablet Take 2.5 mg by mouth daily.  2  . metFORMIN (GLUCOPHAGE) 500 MG tablet Take 1 tablet (500 mg total) by mouth See admin instructions. Take 1000 mg in the morning and 500 mg in the evening    . nitroGLYCERIN (NITROSTAT) 0.4 MG SL tablet Place 0.4 mg under the tongue every 5 (five)  minutes as needed for chest pain.     Marland Kitchen omega-3 acid ethyl esters (LOVAZA) 1 g capsule Take 2 capsules by mouth 2 (two) times daily.  12  . omeprazole (PRILOSEC) 40 MG capsule Take 40 mg by mouth daily.    Marland Kitchen oxyCODONE-acetaminophen (PERCOCET) 10-325 MG tablet Take 1 tablet by mouth every 4 (four) hours as needed for pain.   0   No current facility-administered medications for this visit.     Past Surgical History:  Procedure Laterality Date  . BACK SURGERY  1996  . BREAST LUMPECTOMY Left 1979  . CIRCUMCISION  1980  . MANDIBLE SURGERY Right 1969  . PACEMAKER IMPLANT N/A 06/26/2019   Procedure: PACEMAKER IMPLANT;  Surgeon: Thompson Grayer, MD;  Location: [facility];  Service: Cardiovascular;  Laterality: N/A;     Allergies  Allergen Reactions  . Morphine     Affected pts heart   . Sulfonamide Derivatives Rash    blistering      Family History  Problem Relation Age of Onset  . Lung cancer Mother   . Diabetes Father   . Bladder Cancer Father   . Colon cancer Brother   . Stroke Maternal Grandmother   . Cancer Maternal Grandfather   . Stroke Paternal Grandmother   . Stroke Paternal Grandfather      Social History [name] reports that he has quit smoking. He has never used smokeless tobacco. [name] reports previous alcohol use.   Review of Systems CONSTITUTIONAL: No weight loss, fever, chills, weakness or fatigue.  HEENT: Eyes: No visual loss, blurred vision, double vision or yellow sclerae.No hearing loss, sneezing, congestion, runny nose or sore throat.  SKIN: No rash or itching.  CARDIOVASCULAR: per hpi RESPIRATORY: No shortness of breath, cough or sputum.  GASTROINTESTINAL: No anorexia, nausea, vomiting or diarrhea. No abdominal pain or blood.  GENITOURINARY: No burning on urination, no polyuria NEUROLOGICAL: No headache, dizziness, syncope, paralysis, ataxia, numbness or tingling in the extremities. No change in bowel or bladder control.    MUSCULOSKELETAL: No muscle, back pain, joint pain or stiffness.  LYMPHATICS: No enlarged nodes. No history of splenectomy.  PSYCHIATRIC: No history of depression or anxiety.  ENDOCRINOLOGIC: No reports of sweating, cold or heat intolerance. No polyuria or polydipsia.  Marland Kitchen   Physical Examination Today's Vitals   08/07/19 1506  BP: 121/68  Pulse: 82  SpO2: 99%  Weight: 181 lb 12.8 oz (82.5 kg)  Height: 5\' 9"  (1.753 m)   Body mass index is 26.85 kg/m.  Gen: resting comfortably, no acute distress HEENT: no scleral icterus, pupils equal round and reactive, no palptable cervical adenopathy,  CV: RRR, no mr/g,no jvd Resp: Clear to auscultation bilaterally GI: abdomen is soft, non-tender, non-distended, normal bowel sounds, no hepatosplenomegaly MSK: extremities are  warm, no edema.  Skin: warm, no rash Neuro:  no focal deficits Psych: appropriate affect   Diagnostic Studies 01/2019 event monitor  7 day event monitor. Date is available from 83% of planned monitored time  Min HR 52, Max HR 111, Avg HR 65  Sinus rhythm with episodes of aflutter with variable conduction, no significant RVR. Can have postconversion pause up to 3.2 seconds.  No symptoms reported    Assessment and Plan   1. Bradycardia now s/p pacemaker - falls have resolved. Some orthostatic dizzienss, will try stopping his lisinopril. If ongoign dizzienss asked him to discuss some of this other meds with pcp including his gabapentin.    2. PAF - falls have resolved with pacemaker, started on elqiuis 5mg  bid. Stop ASA      F/u 4 months   , M.D.

## 2019-08-10 DIAGNOSIS — E1122 Type 2 diabetes mellitus with diabetic chronic kidney disease: Secondary | ICD-10-CM | POA: Diagnosis not present

## 2019-08-10 DIAGNOSIS — I129 Hypertensive chronic kidney disease with stage 1 through stage 4 chronic kidney disease, or unspecified chronic kidney disease: Secondary | ICD-10-CM | POA: Diagnosis not present

## 2019-08-10 DIAGNOSIS — E1142 Type 2 diabetes mellitus with diabetic polyneuropathy: Secondary | ICD-10-CM | POA: Diagnosis not present

## 2019-08-10 DIAGNOSIS — G629 Polyneuropathy, unspecified: Secondary | ICD-10-CM | POA: Diagnosis not present

## 2019-08-10 DIAGNOSIS — E782 Mixed hyperlipidemia: Secondary | ICD-10-CM | POA: Diagnosis not present

## 2019-08-17 DIAGNOSIS — I639 Cerebral infarction, unspecified: Secondary | ICD-10-CM | POA: Diagnosis not present

## 2019-08-22 DIAGNOSIS — E782 Mixed hyperlipidemia: Secondary | ICD-10-CM | POA: Diagnosis not present

## 2019-08-22 DIAGNOSIS — E119 Type 2 diabetes mellitus without complications: Secondary | ICD-10-CM | POA: Diagnosis not present

## 2019-08-22 DIAGNOSIS — G9009 Other idiopathic peripheral autonomic neuropathy: Secondary | ICD-10-CM | POA: Diagnosis not present

## 2019-08-22 DIAGNOSIS — I10 Essential (primary) hypertension: Secondary | ICD-10-CM | POA: Diagnosis not present

## 2019-09-12 DIAGNOSIS — I10 Essential (primary) hypertension: Secondary | ICD-10-CM | POA: Diagnosis not present

## 2019-09-12 DIAGNOSIS — E782 Mixed hyperlipidemia: Secondary | ICD-10-CM | POA: Diagnosis not present

## 2019-09-12 DIAGNOSIS — E119 Type 2 diabetes mellitus without complications: Secondary | ICD-10-CM | POA: Diagnosis not present

## 2019-09-12 DIAGNOSIS — G9009 Other idiopathic peripheral autonomic neuropathy: Secondary | ICD-10-CM | POA: Diagnosis not present

## 2019-09-20 NOTE — Telephone Encounter (Signed)
error 

## 2019-09-26 ENCOUNTER — Ambulatory Visit (INDEPENDENT_AMBULATORY_CARE_PROVIDER_SITE_OTHER): Payer: Medicare Other | Admitting: *Deleted

## 2019-09-26 DIAGNOSIS — I480 Paroxysmal atrial fibrillation: Secondary | ICD-10-CM | POA: Diagnosis not present

## 2019-09-26 LAB — CUP PACEART REMOTE DEVICE CHECK
Battery Remaining Longevity: 106 mo
Battery Remaining Percentage: 95.5 %
Battery Voltage: 3.02 V
Brady Statistic AP VP Percent: 1 %
Brady Statistic AP VS Percent: 2.2 %
Brady Statistic AS VP Percent: 1 %
Brady Statistic AS VS Percent: 97 %
Brady Statistic RA Percent Paced: 1.9 %
Brady Statistic RV Percent Paced: 1 %
Date Time Interrogation Session: 20210203040017
Implantable Lead Implant Date: 20201103
Implantable Lead Implant Date: 20201103
Implantable Lead Location: 753859
Implantable Lead Location: 753860
Implantable Pulse Generator Implant Date: 20201103
Lead Channel Impedance Value: 440 Ohm
Lead Channel Impedance Value: 530 Ohm
Lead Channel Pacing Threshold Amplitude: 0.5 V
Lead Channel Pacing Threshold Amplitude: 0.75 V
Lead Channel Pacing Threshold Pulse Width: 0.5 ms
Lead Channel Pacing Threshold Pulse Width: 0.5 ms
Lead Channel Sensing Intrinsic Amplitude: 1.2 mV
Lead Channel Sensing Intrinsic Amplitude: 12 mV
Lead Channel Setting Pacing Amplitude: 3.5 V
Lead Channel Setting Pacing Amplitude: 3.5 V
Lead Channel Setting Pacing Pulse Width: 0.5 ms
Lead Channel Setting Sensing Sensitivity: 2 mV
Pulse Gen Model: 2272
Pulse Gen Serial Number: 9174806

## 2019-09-27 NOTE — Progress Notes (Signed)
PPM Remote  

## 2019-10-14 DIAGNOSIS — G9009 Other idiopathic peripheral autonomic neuropathy: Secondary | ICD-10-CM | POA: Diagnosis not present

## 2019-10-14 DIAGNOSIS — E119 Type 2 diabetes mellitus without complications: Secondary | ICD-10-CM | POA: Diagnosis not present

## 2019-10-14 DIAGNOSIS — E782 Mixed hyperlipidemia: Secondary | ICD-10-CM | POA: Diagnosis not present

## 2019-10-14 DIAGNOSIS — I10 Essential (primary) hypertension: Secondary | ICD-10-CM | POA: Diagnosis not present

## 2019-10-16 ENCOUNTER — Other Ambulatory Visit: Payer: Self-pay

## 2019-10-16 NOTE — Patient Outreach (Signed)
[facility]) Care Management  10/16/2019  JAREMY NOSAL 02-06-1946 [id number]   Medication Adherence call to Mr. Cowan Pilar spoke with patients wife they want a call back at a later day.Mr. Figg is showing past due on Atorvastatin 20 mg under [employer].   Lillia Abed CPhT Pharmacy Technician [facility] Care Management Direct Dial [phone]  Fax [phone] [email or web address]

## 2019-10-23 DIAGNOSIS — H905 Unspecified sensorineural hearing loss: Secondary | ICD-10-CM | POA: Diagnosis not present

## 2019-10-31 ENCOUNTER — Other Ambulatory Visit: Payer: Self-pay

## 2019-10-31 NOTE — Patient Outreach (Signed)
[facility]) Care Management  10/31/2019  WYNTER GRAVE 1946-05-08 [id number]   Medication Adherence call to Mr. Malikah Lakey  Spoke with patients wife she ask to call back at a later time,patient is showing past due on Atorvastatin 20 mg under [facility] Ins.  Lillia Abed CPhT Pharmacy Technician [facility] Care Management Direct Dial [phone]  Fax [phone] [email or web address]

## 2019-11-22 ENCOUNTER — Other Ambulatory Visit: Payer: Self-pay | Admitting: Student

## 2019-11-22 DIAGNOSIS — I480 Paroxysmal atrial fibrillation: Secondary | ICD-10-CM

## 2019-11-23 ENCOUNTER — Encounter: Payer: Medicare Other | Admitting: Internal Medicine

## 2019-12-04 ENCOUNTER — Inpatient Hospital Stay (HOSPITAL_COMMUNITY)
Admission: EM | Admit: 2019-12-04 | Discharge: 2019-12-07 | DRG: 580 | Disposition: A | Discharge status: Home / Self Care | Payer: Medicare Other | Attending: Family Medicine | Admitting: Family Medicine

## 2019-12-04 ENCOUNTER — Emergency Department (HOSPITAL_COMMUNITY): Payer: Medicare Other

## 2019-12-04 ENCOUNTER — Encounter (HOSPITAL_COMMUNITY): Payer: Self-pay

## 2019-12-04 ENCOUNTER — Other Ambulatory Visit: Payer: Self-pay

## 2019-12-04 DIAGNOSIS — L02416 Cutaneous abscess of left lower limb: Secondary | ICD-10-CM

## 2019-12-04 DIAGNOSIS — N179 Acute kidney failure, unspecified: Secondary | ICD-10-CM | POA: Diagnosis not present

## 2019-12-04 DIAGNOSIS — Z8052 Family history of malignant neoplasm of bladder: Secondary | ICD-10-CM | POA: Diagnosis not present

## 2019-12-04 DIAGNOSIS — E119 Type 2 diabetes mellitus without complications: Secondary | ICD-10-CM | POA: Diagnosis not present

## 2019-12-04 DIAGNOSIS — Z20822 Contact with and (suspected) exposure to covid-19: Secondary | ICD-10-CM | POA: Diagnosis present

## 2019-12-04 DIAGNOSIS — Z882 Allergy status to sulfonamides status: Secondary | ICD-10-CM | POA: Diagnosis not present

## 2019-12-04 DIAGNOSIS — Z79899 Other long term (current) drug therapy: Secondary | ICD-10-CM

## 2019-12-04 DIAGNOSIS — I2510 Atherosclerotic heart disease of native coronary artery without angina pectoris: Secondary | ICD-10-CM | POA: Diagnosis present

## 2019-12-04 DIAGNOSIS — I10 Essential (primary) hypertension: Secondary | ICD-10-CM | POA: Diagnosis present

## 2019-12-04 DIAGNOSIS — F329 Major depressive disorder, single episode, unspecified: Secondary | ICD-10-CM | POA: Diagnosis present

## 2019-12-04 DIAGNOSIS — Z823 Family history of stroke: Secondary | ICD-10-CM | POA: Diagnosis not present

## 2019-12-04 DIAGNOSIS — Z79890 Hormone replacement therapy (postmenopausal): Secondary | ICD-10-CM

## 2019-12-04 DIAGNOSIS — B9562 Methicillin resistant Staphylococcus aureus infection as the cause of diseases classified elsewhere: Secondary | ICD-10-CM | POA: Diagnosis not present

## 2019-12-04 DIAGNOSIS — L0291 Cutaneous abscess, unspecified: Secondary | ICD-10-CM

## 2019-12-04 DIAGNOSIS — Z7901 Long term (current) use of anticoagulants: Secondary | ICD-10-CM

## 2019-12-04 DIAGNOSIS — E860 Dehydration: Secondary | ICD-10-CM | POA: Diagnosis not present

## 2019-12-04 DIAGNOSIS — Z801 Family history of malignant neoplasm of trachea, bronchus and lung: Secondary | ICD-10-CM

## 2019-12-04 DIAGNOSIS — E785 Hyperlipidemia, unspecified: Secondary | ICD-10-CM | POA: Diagnosis not present

## 2019-12-04 DIAGNOSIS — Z8673 Personal history of transient ischemic attack (TIA), and cerebral infarction without residual deficits: Secondary | ICD-10-CM

## 2019-12-04 DIAGNOSIS — Z03818 Encounter for observation for suspected exposure to other biological agents ruled out: Secondary | ICD-10-CM | POA: Diagnosis not present

## 2019-12-04 DIAGNOSIS — Z885 Allergy status to narcotic agent status: Secondary | ICD-10-CM

## 2019-12-04 DIAGNOSIS — Z833 Family history of diabetes mellitus: Secondary | ICD-10-CM | POA: Diagnosis not present

## 2019-12-04 DIAGNOSIS — I480 Paroxysmal atrial fibrillation: Secondary | ICD-10-CM | POA: Diagnosis not present

## 2019-12-04 DIAGNOSIS — Z7984 Long term (current) use of oral hypoglycemic drugs: Secondary | ICD-10-CM

## 2019-12-04 DIAGNOSIS — T63301A Toxic effect of unspecified spider venom, accidental (unintentional), initial encounter: Secondary | ICD-10-CM | POA: Diagnosis not present

## 2019-12-04 DIAGNOSIS — E039 Hypothyroidism, unspecified: Secondary | ICD-10-CM | POA: Diagnosis not present

## 2019-12-04 DIAGNOSIS — Z800 Family history of malignant neoplasm of digestive organs: Secondary | ICD-10-CM

## 2019-12-04 DIAGNOSIS — L03116 Cellulitis of left lower limb: Secondary | ICD-10-CM | POA: Diagnosis not present

## 2019-12-04 DIAGNOSIS — Z23 Encounter for immunization: Secondary | ICD-10-CM

## 2019-12-04 DIAGNOSIS — F419 Anxiety disorder, unspecified: Secondary | ICD-10-CM | POA: Diagnosis present

## 2019-12-04 DIAGNOSIS — A4902 Methicillin resistant Staphylococcus aureus infection, unspecified site: Secondary | ICD-10-CM | POA: Diagnosis not present

## 2019-12-04 DIAGNOSIS — L02415 Cutaneous abscess of right lower limb: Secondary | ICD-10-CM | POA: Diagnosis not present

## 2019-12-04 DIAGNOSIS — Z87891 Personal history of nicotine dependence: Secondary | ICD-10-CM

## 2019-12-04 LAB — CBC WITH DIFFERENTIAL/PLATELET
Abs Immature Granulocytes: 0.03 10*3/uL (ref 0.00–0.07)
Basophils Absolute: 0 10*3/uL (ref 0.0–0.1)
Basophils Relative: 0 %
Eosinophils Absolute: 0.3 10*3/uL (ref 0.0–0.5)
Eosinophils Relative: 4 %
HCT: 38.7 % — ABNORMAL LOW (ref 39.0–52.0)
Hemoglobin: 12.2 g/dL — ABNORMAL LOW (ref 13.0–17.0)
Immature Granulocytes: 0 %
Lymphocytes Relative: 24 %
Lymphs Abs: 1.7 10*3/uL (ref 0.7–4.0)
MCH: 30.6 pg (ref 26.0–34.0)
MCHC: 31.5 g/dL (ref 30.0–36.0)
MCV: 97 fL (ref 80.0–100.0)
Monocytes Absolute: 0.6 10*3/uL (ref 0.1–1.0)
Monocytes Relative: 9 %
Neutro Abs: 4.3 10*3/uL (ref 1.7–7.7)
Neutrophils Relative %: 63 %
Platelets: 191 10*3/uL (ref 150–400)
RBC: 3.99 MIL/uL — ABNORMAL LOW (ref 4.22–5.81)
RDW: 13.8 % (ref 11.5–15.5)
WBC: 7 10*3/uL (ref 4.0–10.5)
nRBC: 0 % (ref 0.0–0.2)

## 2019-12-04 LAB — COMPREHENSIVE METABOLIC PANEL
ALT: 25 U/L (ref 0–44)
AST: 23 U/L (ref 15–41)
Albumin: 3.6 g/dL (ref 3.5–5.0)
Alkaline Phosphatase: 70 U/L (ref 38–126)
Anion gap: 8 (ref 5–15)
BUN: 15 mg/dL (ref 8–23)
CO2: 26 mmol/L (ref 22–32)
Calcium: 9 mg/dL (ref 8.9–10.3)
Chloride: 103 mmol/L (ref 98–111)
Creatinine, Ser: 1.37 mg/dL — ABNORMAL HIGH (ref 0.61–1.24)
GFR calc Af Amer: 59 mL/min — ABNORMAL LOW (ref >60)
GFR calc non Af Amer: 51 mL/min — ABNORMAL LOW (ref >60)
Glucose, Bld: 125 mg/dL — ABNORMAL HIGH (ref 70–99)
Potassium: 4.2 mmol/L (ref 3.5–5.1)
Sodium: 137 mmol/L (ref 135–145)
Total Bilirubin: 0.5 mg/dL (ref 0.3–1.2)
Total Protein: 6.8 g/dL (ref 6.5–8.1)

## 2019-12-04 MED ORDER — LEVOTHYROXINE SODIUM 50 MCG PO TABS
125.0000 ug | ORAL_TABLET | Freq: Every morning | ORAL | Status: DC
  Administered 2019-12-05 – 2019-12-07 (×3): 125 ug via ORAL
  Filled 2019-12-04 (×3): qty 3

## 2019-12-04 MED ORDER — ENOXAPARIN SODIUM 40 MG/0.4ML ~~LOC~~ SOLN: 40.0000 mg | SUBCUTANEOUS | Status: DC

## 2019-12-04 MED ORDER — APIXABAN 5 MG PO TABS
5.0000 mg | ORAL_TABLET | Freq: Two times a day (BID) | ORAL | Status: DC
  Administered 2019-12-05 – 2019-12-07 (×6): 5 mg via ORAL
  Filled 2019-12-04 (×6): qty 1

## 2019-12-04 MED ORDER — GABAPENTIN 300 MG PO CAPS
600.0000 mg | ORAL_CAPSULE | Freq: Three times a day (TID) | ORAL | Status: DC
  Administered 2019-12-05 – 2019-12-07 (×8): 600 mg via ORAL
  Filled 2019-12-04 (×8): qty 2

## 2019-12-04 MED ORDER — IOHEXOL 300 MG/ML  SOLN
75.0000 mL | Freq: Once | INTRAMUSCULAR | Status: AC | PRN
  Administered 2019-12-04: 75 mL via INTRAVENOUS

## 2019-12-04 MED ORDER — SODIUM CHLORIDE 0.9 % IV SOLN: INTRAVENOUS | Status: DC

## 2019-12-04 MED ORDER — GLIMEPIRIDE 2 MG PO TABS
2.0000 mg | ORAL_TABLET | Freq: Every day | ORAL | Status: DC
  Administered 2019-12-05: 2 mg via ORAL
  Filled 2019-12-04: qty 1

## 2019-12-04 MED ORDER — LISINOPRIL 5 MG PO TABS: 2.5000 mg | ORAL_TABLET | Freq: Every day | ORAL | Status: DC

## 2019-12-04 MED ORDER — BUPROPION HCL ER (SR) 100 MG PO TB12
100.0000 mg | ORAL_TABLET | Freq: Every morning | ORAL | Status: DC
  Administered 2019-12-05 – 2019-12-07 (×3): 100 mg via ORAL
  Filled 2019-12-04 (×3): qty 1

## 2019-12-04 MED ORDER — LIDOCAINE-EPINEPHRINE (PF) 2 %-1:200000 IJ SOLN
10.0000 mL | Freq: Once | INTRAMUSCULAR | Status: AC
  Administered 2019-12-04: 10 mL
  Filled 2019-12-04: qty 10

## 2019-12-04 MED ORDER — VANCOMYCIN HCL IN DEXTROSE 1-5 GM/200ML-% IV SOLN
1000.0000 mg | Freq: Once | INTRAVENOUS | Status: AC
  Administered 2019-12-04: 1000 mg via INTRAVENOUS
  Filled 2019-12-04: qty 200

## 2019-12-04 MED ORDER — LEVOTHYROXINE SODIUM 50 MCG PO TABS: 150.0000 ug | ORAL_TABLET | Freq: Every day | ORAL | Status: DC

## 2019-12-04 MED ORDER — ALPRAZOLAM 0.5 MG PO TABS
1.0000 mg | ORAL_TABLET | Freq: Two times a day (BID) | ORAL | Status: DC
  Administered 2019-12-05 – 2019-12-06 (×4): 1 mg via ORAL
  Filled 2019-12-04 (×4): qty 2

## 2019-12-04 MED ORDER — ACETAMINOPHEN 650 MG RE SUPP: 650.0000 mg | Freq: Four times a day (QID) | RECTAL | Status: DC | PRN

## 2019-12-04 MED ORDER — OXYCODONE-ACETAMINOPHEN 5-325 MG PO TABS
1.0000 | ORAL_TABLET | Freq: Once | ORAL | Status: AC
  Administered 2019-12-04: 1 via ORAL
  Filled 2019-12-04: qty 1

## 2019-12-04 MED ORDER — OXYCODONE-ACETAMINOPHEN 10-325 MG PO TABS: 1.0000 | ORAL_TABLET | ORAL | Status: DC | PRN

## 2019-12-04 MED ORDER — DIPHENHYDRAMINE HCL 25 MG PO CAPS: 25.0000 mg | ORAL_CAPSULE | Freq: Every day | ORAL | Status: DC | PRN

## 2019-12-04 MED ORDER — NITROGLYCERIN 0.4 MG SL SUBL: 0.4000 mg | SUBLINGUAL_TABLET | SUBLINGUAL | Status: DC | PRN

## 2019-12-04 MED ORDER — MAGNESIUM HYDROXIDE 400 MG/5ML PO SUSP: 30.0000 mL | Freq: Every day | ORAL | Status: DC | PRN

## 2019-12-04 MED ORDER — ONDANSETRON HCL 4 MG/2ML IJ SOLN: 4.0000 mg | Freq: Four times a day (QID) | INTRAMUSCULAR | Status: DC | PRN

## 2019-12-04 MED ORDER — ACETAMINOPHEN 325 MG PO TABS: 650.0000 mg | ORAL_TABLET | Freq: Four times a day (QID) | ORAL | Status: DC | PRN

## 2019-12-04 MED ORDER — TRAZODONE HCL 50 MG PO TABS
25.0000 mg | ORAL_TABLET | Freq: Every evening | ORAL | Status: DC | PRN
  Filled 2019-12-04: qty 1

## 2019-12-04 MED ORDER — ATORVASTATIN CALCIUM 10 MG PO TABS
20.0000 mg | ORAL_TABLET | Freq: Every day | ORAL | Status: DC
  Administered 2019-12-05 – 2019-12-06 (×2): 20 mg via ORAL
  Filled 2019-12-04 (×2): qty 2

## 2019-12-04 MED ORDER — OMEGA-3-ACID ETHYL ESTERS 1 G PO CAPS
2.0000 | ORAL_CAPSULE | Freq: Two times a day (BID) | ORAL | Status: DC
  Administered 2019-12-05 – 2019-12-07 (×6): 2 g via ORAL
  Filled 2019-12-04 (×6): qty 2

## 2019-12-04 MED ORDER — PANTOPRAZOLE SODIUM 40 MG PO TBEC
40.0000 mg | DELAYED_RELEASE_TABLET | Freq: Every day | ORAL | Status: DC
  Administered 2019-12-05 – 2019-12-07 (×3): 40 mg via ORAL
  Filled 2019-12-04 (×3): qty 1

## 2019-12-04 MED ORDER — ONDANSETRON HCL 4 MG PO TABS: 4.0000 mg | ORAL_TABLET | Freq: Four times a day (QID) | ORAL | Status: DC | PRN

## 2019-12-04 NOTE — ED Provider Notes (Signed)
..  Incision and Drainage  Date/Time: 12/04/2019 9:30 PM Performed by: Dartha Lodge, PA-C Authorized by: Dartha Lodge, PA-C   Consent:    Consent obtained:  Verbal   Consent given by:  Patient   Risks discussed:  Bleeding, incomplete drainage, infection and damage to other organs   Alternatives discussed:  No treatment Location:    Type:  Abscess   Size:  3 x 2 cm   Location:  Lower extremity   Lower extremity location:  Leg   Leg location:  L upper leg Pre-procedure details:    Skin preparation:  Antiseptic wash Anesthesia (see MAR for exact dosages):    Anesthesia method:  Local infiltration   Local anesthetic:  Lidocaine 2% WITH epi Procedure type:    Complexity:  Complex Procedure details:    Incision types:  Single straight   Incision depth:  Dermal   Scalpel blade:  11   Wound management:  Probed and deloculated   Drainage:  Bloody and purulent   Drainage amount:  Moderate   Wound treatment:  Wound left open   Packing materials:  1/4 in iodoform gauze Post-procedure details:    Patient tolerance of procedure:  Tolerated well, no immediate complications      Legrand Rams 12/04/19 2131    Bethann Berkshire, MD 12/05/19 1016

## 2019-12-04 NOTE — ED Triage Notes (Signed)
Pt sent Dr Scharlene Gloss office for abscess on left thigh. Pt states he was bit by spider last week. Pt denies fever.

## 2019-12-04 NOTE — H&P (Addendum)
[place] at [facility]   PATIENT NAME: [name]    MR#:  [id number]  DATE OF BIRTH:  1945/11/15  DATE OF ADMISSION:  12/04/2019  PRIMARY CARE PHYSICIAN: Benita Stabile, MD   REQUESTING/REFERRING PHYSICIAN: Bethann Berkshire, MD  CHIEF COMPLAINT:   Chief Complaint  Patient presents with  . Abscess    HISTORY OF PRESENT ILLNESS:  [name]  is a 74 y.o. Caucasian male with a known history of hypertension, dyslipidemia, hypothyroidism, paroxysmal atrial fibrillation CVA and nonobstructive coronary artery disease, presented to the emergency room with acute onset of left medial thigh swelling with erythema, warmth tenderness and pain over the last couple of days which has become throbbing today.  He was seen by his primary care physician who discussed the case with Dr. Henreitta Leber  and both recommended referral to the ER physician for further management with IV antibiotics.  The patient denies any fever or chills.  No chest pain or dyspnea cough.  He had nausea and vomiting after p.o. Percocet in the ER.  Upon presentation to the emergency room vital signs revealed blood pressure 147/83 with a heart rate of 47 and later 88 and otherwise normal vital signs.  Labs revealed a creatinine of 1.37 compared to 1.16 on 06/11/2019 with otherwise unremarkable CMP and CBC showed minimal anemia.  The patient had an I&D of a left thigh abscess by the ER physician.  He was given IV vancomycin and p.o. Percocet.  Lidocaine with epi was used to perform the I&D.  The patient will be admitted to a medical  bed for further evaluation and management of his surrounding severe cellulitis.  PAST MEDICAL HISTORY:   Past Medical History:  Diagnosis Date  . Alcohol use   . Anxiety   . Chest pain   . Coronary artery disease    a. Nonobstructive CAD by cath 2010 with negative nuclear stress test 05/2013.  . Depression   . Diabetes mellitus   . Hyperlipidemia   . Hypertension   .  Hypothyroidism   . Kidney problem    a. 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  . Mild aortic insufficiency   . PAT (paroxysmal atrial tachycardia) (HCC)   . Pericardial effusion    a. by echo 2014.  . Pulmonary nodules    resolved  . PVC's (premature ventricular contractions)   . Stroke (cerebrum) (HCC)   . 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 Wide-complex tachycardia (HCC)    a. per [name] note: Event monitor September 2014 sinus rhythm, occasional PVCs, nonsustained PAT, and one 7 beat run of a wide QRS complex at 110-120 bpm, with minimal changes QRS axis, and QRS morphology not similar to documented PVCs.     PAST SURGICAL HISTORY:   Past Surgical History:  Procedure Laterality Date  . BACK SURGERY  1996  . BREAST LUMPECTOMY Left 1979  . CIRCUMCISION  1980  . MANDIBLE SURGERY Right 1969  . PACEMAKER IMPLANT N/A 06/26/2019   Procedure: PACEMAKER IMPLANT;  Surgeon: Hillis Range, MD;  Location: [name];  Service: Cardiovascular;  Laterality: N/A;    SOCIAL HISTORY:   Social History   Tobacco Use  . Smoking status: Former Games developer  . Smokeless tobacco: Never Used  Substance Use Topics  . Alcohol use: Not Currently    FAMILY HISTORY:   Family History  Problem Relation Age of Onset  . Lung cancer  Mother   . Diabetes Father   . Bladder Cancer Father   . Colon cancer Brother   . Stroke Maternal Grandmother   . Cancer Maternal Grandfather   . Stroke Paternal Grandmother   . Stroke Paternal Grandfather     DRUG ALLERGIES:   Allergies  Allergen Reactions  . Morphine     Affected pts heart   . Sulfonamide Derivatives Rash    blistering    REVIEW OF SYSTEMS:   ROS As per history of present illness. All pertinent systems were reviewed above. Constitutional,  HEENT, cardiovascular, respiratory, GI, GU, musculoskeletal, neuro, psychiatric, endocrine,  integumentary and hematologic systems were  reviewed and are otherwise  negative/unremarkable except for positive findings mentioned above in the HPI.   MEDICATIONS AT HOME:   Prior to Admission medications   Medication Sig Start Date End Date Taking? Authorizing Provider  acetaminophen (TYLENOL) 500 MG tablet Take 500 mg by mouth every 8 (eight) hours as needed for moderate pain or headache.   Yes [provider]  ALPRAZolam Duanne Moron) 1 MG tablet Take 1 mg by mouth at bedtime. *May take one additional tablet daily as needed 01/18/16  Yes [provider]  atorvastatin (LIPITOR) 20 MG tablet Take 20 mg by mouth daily.  01/16/16  Yes [provider]  buPROPion (WELLBUTRIN SR) 100 MG 12 hr tablet Take 100 mg by mouth every morning. 11/28/19  Yes [provider]  diphenhydrAMINE (BENADRYL) 25 MG tablet Take 25 mg by mouth daily as needed for allergies.   Yes [provider]  ELIQUIS 5 MG TABS tablet TAKE 1 TABLET BY MOUTH TWICE DAILY Patient taking differently: Take 5 mg by mouth 2 (two) times daily.  11/22/19  Yes Branch, Alphonse Guild, MD  gabapentin (NEURONTIN) 600 MG tablet Take 600 mg by mouth 3 (three) times daily.  12/17/17  Yes [provider]  glimepiride (AMARYL) 2 MG tablet Take 2 mg by mouth daily.  03/16/16  Yes [provider]  levothyroxine (SYNTHROID) 125 MCG tablet Take 125 mcg by mouth every morning. 11/28/19  Yes [provider]  metFORMIN (GLUCOPHAGE) 500 MG tablet Take 1 tablet (500 mg total) by mouth See admin instructions. Take 1000 mg in the morning and 500 mg in the evening Patient taking differently: Take 500-1,000 mg by mouth See admin instructions. Take 1000 mg in the morning and 500 mg in the evening 06/26/19  Yes Allred, Jeneen Rinks, MD  nitroGLYCERIN (NITROSTAT) 0.4 MG SL tablet Place 0.4 mg under the tongue every 5 (five) minutes as needed for chest pain.  03/08/16  Yes [provider]  omega-3 acid ethyl esters (LOVAZA) 1 g capsule Take 2 capsules by  mouth 2 (two) times daily. 12/17/17  Yes [provider]  omeprazole (PRILOSEC) 40 MG capsule Take 40 mg by mouth in the morning.    Yes [provider]  oxyCODONE-acetaminophen (PERCOCET) 10-325 MG tablet Take 1 tablet by mouth 5 (five) times daily.  02/08/18  Yes [provider]      VITAL SIGNS:  Blood pressure 132/74, pulse 86, temperature (!) 97.5 F (36.4 C), temperature source Oral, resp. rate 15, height 5\' 9"  (1.753 m), weight 84.8 kg, SpO2 97 %.  PHYSICAL EXAMINATION:  Physical Exam  GENERAL:  74 y.o.-year-old Caucasian male patient lying in the bed with no acute distress.  EYES: Pupils equal, round, reactive to light and accommodation. No scleral icterus. Extraocular muscles intact.  HEENT: Head atraumatic, normocephalic. Oropharynx and nasopharynx clear.  NECK:  Supple, no jugular venous distention. No thyroid enlargement, no tenderness.  LUNGS: Normal breath sounds bilaterally, no wheezing, rales,rhonchi or crepitation. No use of accessory muscles of respiration.  CARDIOVASCULAR: Regular rate and rhythm, S1, S2 normal. No murmurs, rubs, or gallops.  ABDOMEN: Soft, nondistended, nontender. Bowel sounds present. No organomegaly or mass.  EXTREMITIES: No pedal edema, cyanosis, or clubbing.  NEUROLOGIC: Cranial nerves II through XII are intact. Muscle strength 5/5 in all extremities. Sensation intact. Gait not checked.  PSYCHIATRIC: The patient is alert and oriented x 3.  Normal affect and good eye contact. SKIN: Left medial thigh erythema with induration, tenderness and warmth surrounding an abscess there is status post incision and drainage and packing with iodoform gauze.    LABORATORY PANEL:   CBC Recent Labs  Lab 12/04/19 1755  WBC 7.0  HGB 12.2*  HCT 38.7*  PLT 191   ------------------------------------------------------------------------------------------------------------------  Chemistries  Recent Labs  Lab 12/04/19 1755  NA 137   K 4.2  CL 103  CO2 26  GLUCOSE 125*  BUN 15  CREATININE 1.37*  CALCIUM 9.0  AST 23  ALT 25  ALKPHOS 70  BILITOT 0.5   ------------------------------------------------------------------------------------------------------------------  Cardiac Enzymes No results for input(s): TROPONINI in the last 168 hours. ------------------------------------------------------------------------------------------------------------------  RADIOLOGY:  CT FEMUR LEFT W CONTRAST  Result Date: 12/04/2019 CLINICAL DATA:  Left thigh abscess. EXAM: CT OF THE LOWER RIGHT EXTREMITY WITH CONTRAST TECHNIQUE: Multidetector CT imaging of the lower right extremity was performed according to the standard protocol following intravenous contrast administration. COMPARISON:  None. CONTRAST:  7mL OMNIPAQUE IOHEXOL 300 MG/ML  SOLN FINDINGS: Mild skin thickening and interstitial changes in the subcutaneous fat of the mid distal thigh suggesting cellulitis. There is also a focal fluid collection in the medial the subcutaneous fat measuring 3.0 x 1.8 cm and consistent with a small abscess. The thigh musculature appears normal. No evidence of myofasciitis or pyomyositis. The hip and knee joints are maintained. Mild degenerative changes and chondrocalcinosis but no findings for septic arthritis. The femur is normal. No evidence of osteomyelitis. IMPRESSION: 1. 3.0 x 1.8 cm subcutaneous abscess in the medial subcutaneous fat of the mid distal thigh. Mild surrounding cellulitis. 2. No CT findings for myofasciitis or pyomyositis. 3. No CT findings for septic arthritis or osteomyelitis. Electronically Signed   By: Rudie Meyer M.D.   On: 12/04/2019 19:30      IMPRESSION AND PLAN:   1.  Left medial thigh abscess status post incision and drainage with surrounding severe nonpurulent cellulitis with skin discoloration and sloughing and immunosuppression in the setting of type 2 diabetes mellitus. -The patient will be admitted to a  medical bed. -We will continue antibiotic therapy with IV vancomycin and Zosyn per cellulitis order set. -We will apply warm compresses every shift. -General surgery consultation will be obtained by Dr. Henreitta Leber who is aware about the patient, as the case was discussed by the ER physician with her. -Pain management will be provided with as needed IV fentanyl.  2.  Acute kidney injury. -The patient will be hydrated with IV normal saline. -We will follow BMPs. -We will hold nephrotoxins.  3.  Hypothyroidism. -We will continue Synthroid and check TSH level.  4. Type II diabetes mellitus. -The patient will be placed on supplemental coverage with NovoLog.  We will hold Metformin.  5.  Depression. -We will continue Wellbutrin SR.  6.  Paroxysmal atrial fibrillation. -We will continue Eliquis.  7.  DVT prophylaxis. -We will continue Eliquis.  8.  GI prophylaxis. -We will continue PPI therapy.   All the records are reviewed and case discussed with ED provider. The plan of care was discussed in details with the patient (and family). I answered all questions. The patient agreed to proceed with the above mentioned plan. Further management will depend upon hospital course.   CODE STATUS: Full code  Status is: Inpatient  Remains inpatient appropriate because:Ongoing active pain requiring inpatient pain management, IV treatments appropriate due to intensity of illness or inability to take PO and Inpatient level of care appropriate due to severity of illness   Dispo: The patient is from: Home              Anticipated d/c is to: Home              Anticipated d/c date is: 2 days              Patient currently is not medically stable to d/c.       TOTAL TIME TAKING CARE OF THIS PATIENT: 55 minutes.    Hannah Beat M.D on 12/04/2019 at 11:03 PM  [facility]   From 7 PM-7 AM, contact night-coverage [email or web address]  CC: Primary care physician; Benita Stabile, MD   Note:  This dictation was prepared with Dragon dictation along with smaller phrase technology. Any transcriptional errors that result from this process are unintentional.

## 2019-12-04 NOTE — ED Provider Notes (Signed)
[facility] EMERGENCY DEPARTMENT Provider Note   CSN: [id number] Arrival date & time: 12/04/19  1648     History Chief Complaint  Patient presents with  . Abscess    [name] is a 74 y.o. male.  Patient complains of swelling and tenderness to left leg.  He was seen by primary care doctor who also spoke to Dr. Henreitta Leber general surgery and it was decided he needed to be admitted for IV antibiotics and possible drainage of an abscess  The history is provided by the patient.  Abscess Location:  Leg Leg abscess location:  L hip Abscess quality: not draining   Red streaking: no   Progression:  Worsening Chronicity:  New Context: diabetes   Relieved by:  Nothing Worsened by:  Nothing Ineffective treatments:  None tried Associated symptoms: no anorexia, no fatigue and no headaches        Past Medical History:  Diagnosis Date  . Alcohol use   . Anxiety   . Chest pain   . Coronary artery disease    a. Nonobstructive CAD by cath 2010 with negative nuclear stress test 05/2013.  . Depression   . Diabetes mellitus   . Hyperlipidemia   . Hypertension   . Hypothyroidism   . Kidney problem    a. 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  . Mild aortic insufficiency   . PAT (paroxysmal atrial tachycardia) (HCC)   . Pericardial effusion    a. by echo 2014.  . Pulmonary nodules    resolved  . PVC's (premature ventricular contractions)   . Stroke (cerebrum) (HCC)   . 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 Wide-complex tachycardia (HCC)    a. per [name] note: Event monitor September 2014 sinus rhythm, occasional PVCs, nonsustained PAT, and one 7 beat run of a wide QRS complex at 110-120 bpm, with minimal changes QRS axis, and QRS morphology not similar to documented PVCs.     Patient Active Problem List   Diagnosis Date Noted  . Chest pain at rest 11/02/2018  . Type 2 diabetes mellitus without complication  (HCC) 11/02/2018  . Unstable angina (HCC) 03/08/2018  . Hypothyroidism 03/08/2018  . Anxiety 03/08/2018  . RUQ pain 03/08/2018  . Chronic back pain 03/08/2018  . CKD (chronic kidney disease) stage 3, GFR 30-59 ml/min 03/08/2018  . Intractable back pain 12/29/2017  . Chest pain 12/28/2017  . Abnormality of gait 03/24/2016  . Stroke due to embolism of left carotid artery (HCC) 03/24/2016  . HLD (hyperlipidemia) 05/22/2009  . Essential hypertension 05/22/2009  . CAD, NATIVE VESSEL 05/22/2009  . CHEST PAIN-UNSPECIFIED 05/22/2009    Past Surgical History:  Procedure Laterality Date  . BACK SURGERY  1996  . BREAST LUMPECTOMY Left 1979  . CIRCUMCISION  1980  . MANDIBLE SURGERY Right 1969  . PACEMAKER IMPLANT N/A 06/26/2019   Procedure: PACEMAKER IMPLANT;  Surgeon: Hillis Range, MD;  Location: [name];  Service: Cardiovascular;  Laterality: N/A;       Family History  Problem Relation Age of Onset  . Lung cancer Mother   . Diabetes Father   . Bladder Cancer Father   . Colon cancer Brother   . Stroke Maternal Grandmother   . Cancer Maternal Grandfather   . Stroke Paternal Grandmother   . Stroke Paternal Grandfather     Social History   Tobacco Use  . Smoking status: Former Games developer  . Smokeless  tobacco: Never Used  Substance Use Topics  . Alcohol use: Not Currently  . Drug use: No    Home Medications Prior to Admission medications   Medication Sig Start Date End Date Taking? Authorizing Provider  acetaminophen (TYLENOL) 500 MG tablet Take 500 mg by mouth every 8 (eight) hours as needed for moderate pain or headache.    [provider]  ALPRAZolam Duanne Moron) 1 MG tablet Take 1 mg by mouth 2 (two) times daily.  01/18/16   [provider]  atorvastatin (LIPITOR) 20 MG tablet Take 20 mg by mouth daily.  01/16/16   [provider]  buPROPion (WELLBUTRIN SR) 100 MG 12 hr tablet Take 100 mg by mouth every morning. 11/28/19   [provider]  diphenhydrAMINE (BENADRYL) 25 MG tablet Take 25 mg by mouth daily as needed for allergies.    [provider]  ELIQUIS 5 MG TABS tablet TAKE 1 TABLET BY MOUTH TWICE DAILY 11/22/19   Arnoldo Lenis, MD  gabapentin (NEURONTIN) 600 MG tablet Take 600 mg by mouth 3 (three) times daily.  12/17/17   [provider]  glimepiride (AMARYL) 2 MG tablet Take 2 mg by mouth daily.  03/16/16   [provider]  levothyroxine (SYNTHROID) 125 MCG tablet Take 125 mcg by mouth every morning. 11/28/19   [provider]  levothyroxine (SYNTHROID) 150 MCG tablet Take 150 mcg by mouth daily. 04/05/19   [provider]  lisinopril (ZESTRIL) 2.5 MG tablet Take 2.5 mg by mouth daily. 11/28/19   [provider]  metFORMIN (GLUCOPHAGE) 500 MG tablet Take 1 tablet (500 mg total) by mouth See admin instructions. Take 1000 mg in the morning and 500 mg in the evening 06/26/19   Allred, Jeneen Rinks, MD  nitroGLYCERIN (NITROSTAT) 0.4 MG SL tablet Place 0.4 mg under the tongue every 5 (five) minutes as needed for chest pain.  03/08/16   [provider]  omega-3 acid ethyl esters (LOVAZA) 1 g capsule Take 2 capsules by mouth 2 (two) times daily. 12/17/17   [provider]  omeprazole (PRILOSEC) 40 MG capsule Take 40 mg by mouth daily.    [provider]  oxyCODONE-acetaminophen (PERCOCET) 10-325 MG tablet Take 1 tablet by mouth every 4 (four) hours as needed for pain.  02/08/18   [provider]    Allergies    Morphine and Sulfonamide derivatives  Review of Systems   Review of Systems  Constitutional: Negative for appetite change and fatigue.  HENT: Negative for congestion, ear discharge and sinus pressure.   Eyes: Negative for discharge.  Respiratory: Negative for cough.   Cardiovascular: Negative for chest pain.  Gastrointestinal: Negative for abdominal pain, anorexia and diarrhea.  Genitourinary: Negative for frequency and hematuria.   Musculoskeletal: Negative for back pain.       Pain and swelling left lower leg  Skin: Negative for rash.  Neurological: Negative for seizures and headaches.  Psychiatric/Behavioral: Negative for hallucinations.    Physical Exam Updated Vital Signs BP 140/78   Pulse 86   Temp (!) 97.5 F (36.4 C) (Oral)   Resp 15   Ht 5\' 9"  (1.753 m)   Wt 84.8 kg   SpO2 97%   BMI 27.62 kg/m   Physical Exam Vitals and nursing note reviewed.  Constitutional:      Appearance: He is well-developed.  HENT:     Head: Normocephalic.     Mouth/Throat:     Mouth: Mucous membranes are moist.  Eyes:  General: No scleral icterus.    Conjunctiva/sclera: Conjunctivae normal.  Neck:     Thyroid: No thyromegaly.  Cardiovascular:     Rate and Rhythm: Normal rate and regular rhythm.     Heart sounds: No murmur. No friction rub. No gallop.   Pulmonary:     Breath sounds: No stridor. No wheezing or rales.  Chest:     Chest wall: No tenderness.  Abdominal:     General: There is no distension.     Tenderness: There is no abdominal tenderness. There is no rebound.  Musculoskeletal:        General: Normal range of motion.     Cervical back: Neck supple.     Comments: Pain swelling tenderness left thigh  Lymphadenopathy:     Cervical: No cervical adenopathy.  Skin:    Findings: No erythema or rash.  Neurological:     Mental Status: He is alert and oriented to person, place, and time.     Motor: No abnormal muscle tone.     Coordination: Coordination normal.  Psychiatric:        Behavior: Behavior normal.     ED Results / Procedures / Treatments   Labs (all labs ordered are listed, but only abnormal results are displayed) Labs Reviewed  CBC WITH DIFFERENTIAL/PLATELET - Abnormal; Notable for the following components:      Result Value   RBC 3.99 (*)    Hemoglobin 12.2 (*)    HCT 38.7 (*)    All other components within normal limits  COMPREHENSIVE METABOLIC PANEL - Abnormal; Notable for  the following components:   Glucose, Bld 125 (*)    Creatinine, Ser 1.37 (*)    GFR calc non Af Amer 51 (*)    GFR calc Af Amer 59 (*)    All other components within normal limits  AEROBIC CULTURE (SUPERFICIAL SPECIMEN)    EKG None  Radiology CT FEMUR LEFT W CONTRAST  Result Date: 12/04/2019 CLINICAL DATA:  Left thigh abscess. EXAM: CT OF THE LOWER RIGHT EXTREMITY WITH CONTRAST TECHNIQUE: Multidetector CT imaging of the lower right extremity was performed according to the standard protocol following intravenous contrast administration. COMPARISON:  None. CONTRAST:  72mL OMNIPAQUE IOHEXOL 300 MG/ML  SOLN FINDINGS: Mild skin thickening and interstitial changes in the subcutaneous fat of the mid distal thigh suggesting cellulitis. There is also a focal fluid collection in the medial the subcutaneous fat measuring 3.0 x 1.8 cm and consistent with a small abscess. The thigh musculature appears normal. No evidence of myofasciitis or pyomyositis. The hip and knee joints are maintained. Mild degenerative changes and chondrocalcinosis but no findings for septic arthritis. The femur is normal. No evidence of osteomyelitis. IMPRESSION: 1. 3.0 x 1.8 cm subcutaneous abscess in the medial subcutaneous fat of the mid distal thigh. Mild surrounding cellulitis. 2. No CT findings for myofasciitis or pyomyositis. 3. No CT findings for septic arthritis or osteomyelitis. Electronically Signed   By: Rudie Meyer M.D.   On: 12/04/2019 19:30    Procedures Procedures (including critical care time)  Medications Ordered in ED Medications  vancomycin (VANCOCIN) IVPB 1000 mg/200 mL premix (0 mg Intravenous Stopped 12/04/19 1948)  iohexol (OMNIPAQUE) 300 MG/ML solution 75 mL (75 mLs Intravenous Contrast Given 12/04/19 1841)  oxyCODONE-acetaminophen (PERCOCET/ROXICET) 5-325 MG per tablet 1 tablet (1 tablet Oral Given 12/04/19 2016)  lidocaine-EPINEPHrine (XYLOCAINE W/EPI) 2 %-1:200000 (PF) injection 10 mL (10 mLs  Infiltration Given 12/04/19 2016)    ED Course  I have  reviewed the triage vital signs and the nursing notes.  Pertinent labs & imaging results that were available during my care of the patient were reviewed by me and considered in my medical decision making (see chart for details).    MDM Rules/Calculators/A&P                      Patient with cellulitis of left leg and abscess that was drained by physician assistant Jodi Geralds.  Patient will be admitted to medicine with IV antibiotics     This patient presents to the ED for concern of leg pain, this involves an extensive number of treatment options, and is a complaint that carries with it a high risk of complications and morbidity.  The differential diagnosis includes cellulitis.  Necrosis.   Lab Tests:   I Ordered, reviewed, and interpreted labs, which included CBC and chemistry.  Patient sugar was 125 and his hemoglobin was 12.2 anemic  Medicines ordered:   I ordered medication vancomycin for infection  Imaging Studies ordered:   I ordered imaging studies which included CT leg and  I independently visualized and interpreted imaging which showed abscess left leg  Additional history obtained:   Additional history obtained from old charts  Previous records obtained and reviewed   Consultations Obtained:   I consulted hospitalist and general surgeon and discussed lab and imaging findings  Reevaluation:  After the interventions stated above, I reevaluated the patient and found patient improved after I&D done  Critical Interventions:  .   Final Clinical Impression(s) / ED Diagnoses Final diagnoses:  Abscess  Cellulitis and abscess of left leg    Rx / DC Orders ED Discharge Orders    None       Bethann Berkshire, MD 12/04/19 2221

## 2019-12-05 ENCOUNTER — Encounter (HOSPITAL_COMMUNITY): Payer: Self-pay | Admitting: Family Medicine

## 2019-12-05 ENCOUNTER — Other Ambulatory Visit: Payer: Self-pay

## 2019-12-05 DIAGNOSIS — L03116 Cellulitis of left lower limb: Secondary | ICD-10-CM | POA: Diagnosis not present

## 2019-12-05 DIAGNOSIS — L02416 Cutaneous abscess of left lower limb: Secondary | ICD-10-CM | POA: Diagnosis not present

## 2019-12-05 LAB — CBC
HCT: 37.6 % — ABNORMAL LOW (ref 39.0–52.0)
Hemoglobin: 12.3 g/dL — ABNORMAL LOW (ref 13.0–17.0)
MCH: 30.9 pg (ref 26.0–34.0)
MCHC: 32.7 g/dL (ref 30.0–36.0)
MCV: 94.5 fL (ref 80.0–100.0)
Platelets: 198 10*3/uL (ref 150–400)
RBC: 3.98 MIL/uL — ABNORMAL LOW (ref 4.22–5.81)
RDW: 13.5 % (ref 11.5–15.5)
WBC: 6.4 10*3/uL (ref 4.0–10.5)
nRBC: 0 % (ref 0.0–0.2)

## 2019-12-05 LAB — TSH: TSH: 1.217 u[IU]/mL (ref 0.350–4.500)

## 2019-12-05 LAB — BASIC METABOLIC PANEL
Anion gap: 8 (ref 5–15)
BUN: 13 mg/dL (ref 8–23)
CO2: 25 mmol/L (ref 22–32)
Calcium: 8.9 mg/dL (ref 8.9–10.3)
Chloride: 107 mmol/L (ref 98–111)
Creatinine, Ser: 1.11 mg/dL (ref 0.61–1.24)
GFR calc Af Amer: >60 mL/min (ref >60)
GFR calc non Af Amer: >60 mL/min (ref >60)
Glucose, Bld: 158 mg/dL — ABNORMAL HIGH (ref 70–99)
Potassium: 4.1 mmol/L (ref 3.5–5.1)
Sodium: 140 mmol/L (ref 135–145)

## 2019-12-05 LAB — GLUCOSE, CAPILLARY
Glucose-Capillary: 101 mg/dL — ABNORMAL HIGH (ref 70–99)
Glucose-Capillary: 129 mg/dL — ABNORMAL HIGH (ref 70–99)
Glucose-Capillary: 103 mg/dL — ABNORMAL HIGH (ref 70–99)
Glucose-Capillary: 162 mg/dL — ABNORMAL HIGH (ref 70–99)

## 2019-12-05 LAB — SARS CORONAVIRUS 2 BY RT PCR (DIASORIN): SARS Coronavirus 2: NEGATIVE

## 2019-12-05 MED ORDER — PIPERACILLIN-TAZOBACTAM 3.375 G IVPB
3.3750 g | Freq: Three times a day (TID) | INTRAVENOUS | Status: DC
  Administered 2019-12-05 (×3): 3.375 g via INTRAVENOUS
  Filled 2019-12-05 (×3): qty 50

## 2019-12-05 MED ORDER — FENTANYL CITRATE (PF) 100 MCG/2ML IJ SOLN
25.0000 ug | INTRAMUSCULAR | Status: DC | PRN
  Administered 2019-12-05: 25 ug via INTRAVENOUS
  Filled 2019-12-05: qty 2

## 2019-12-05 MED ORDER — OXYCODONE HCL 5 MG PO TABS
5.0000 mg | ORAL_TABLET | ORAL | Status: DC | PRN
  Administered 2019-12-05 – 2019-12-06 (×7): 5 mg via ORAL
  Filled 2019-12-05 (×7): qty 1

## 2019-12-05 MED ORDER — PIPERACILLIN-TAZOBACTAM 3.375 G IVPB 30 MIN: 3.3750 g | Freq: Once | INTRAVENOUS | Status: DC

## 2019-12-05 MED ORDER — IPRATROPIUM-ALBUTEROL 0.5-2.5 (3) MG/3ML IN SOLN
3.0000 mL | Freq: Four times a day (QID) | RESPIRATORY_TRACT | Status: DC | PRN
  Administered 2019-12-05 – 2019-12-07 (×3): 3 mL via RESPIRATORY_TRACT
  Filled 2019-12-05 (×5): qty 3

## 2019-12-05 MED ORDER — VANCOMYCIN HCL 1500 MG/300ML IV SOLN
1500.0000 mg | INTRAVENOUS | Status: DC
  Administered 2019-12-05 – 2019-12-07 (×3): 1500 mg via INTRAVENOUS
  Filled 2019-12-05 (×3): qty 300

## 2019-12-05 MED ORDER — OXYCODONE-ACETAMINOPHEN 5-325 MG PO TABS
1.0000 | ORAL_TABLET | ORAL | Status: DC | PRN
  Administered 2019-12-05 – 2019-12-06 (×6): 1 via ORAL
  Filled 2019-12-05 (×6): qty 1

## 2019-12-05 MED ORDER — SACCHAROMYCES BOULARDII 250 MG PO CAPS
250.0000 mg | ORAL_CAPSULE | Freq: Two times a day (BID) | ORAL | Status: DC
  Administered 2019-12-05 – 2019-12-07 (×4): 250 mg via ORAL
  Filled 2019-12-05 (×4): qty 1

## 2019-12-05 MED ORDER — INSULIN ASPART 100 UNIT/ML ~~LOC~~ SOLN
0.0000 [IU] | Freq: Three times a day (TID) | SUBCUTANEOUS | Status: DC
  Administered 2019-12-05: 12:00:00 1 [IU] via SUBCUTANEOUS
  Administered 2019-12-05: 2 [IU] via SUBCUTANEOUS
  Administered 2019-12-06: 3 [IU] via SUBCUTANEOUS
  Administered 2019-12-06: 1 [IU] via SUBCUTANEOUS

## 2019-12-05 MED ORDER — VANCOMYCIN HCL IN DEXTROSE 1-5 GM/200ML-% IV SOLN: 1000.0000 mg | Freq: Once | INTRAVENOUS | Status: DC

## 2019-12-05 MED ORDER — IPRATROPIUM-ALBUTEROL 0.5-2.5 (3) MG/3ML IN SOLN: 3.0000 mL | Freq: Four times a day (QID) | RESPIRATORY_TRACT | Status: DC

## 2019-12-05 NOTE — Plan of Care (Signed)

## 2019-12-05 NOTE — Progress Notes (Signed)
Pharmacy Antibiotic Note  [name] is a 74 y.o. male admitted on 12/04/2019 with LLE cellulitis.  Pharmacy has been consulted for Vancomycin and Zosyn  Dosing.  Vancomycin 1 g IV given in ED at  1800 4/13  Plan: Vancomycin 1500 mg IV q24h Est AUC 547 Zosyn 3.375 g IV q8h   Height: 5\' 9"  (175.3 cm) Weight: 84.9 kg (187 lb 2.7 oz) IBW/kg (Calculated) : 70.7  Temp (24hrs), Avg:98.1 F (36.7 C), Min:97.5 F (36.4 C), Max:98.6 F (37 C)  Recent Labs  Lab 12/04/19 1755  WBC 7.0  CREATININE 1.37*    Estimated Creatinine Clearance: 52.7 mL/min (A) (by C-G formula based on SCr of 1.37 mg/dL (H)).    Allergies  Allergen Reactions  . Morphine     Affected pts heart   . Sulfonamide Derivatives Rash    blistering    12/06/19 12/05/2019 12:50 AM

## 2019-12-05 NOTE — Consult Note (Signed)
[facility] Consult  Reason for Consult: Left thigh abscess/ spider bite  Referring Physician:  Dr. Laural Benes   Chief Complaint    Abscess      HPI: [name] is a 74 y.o. male with a 10 day history of a small area on his left thigh that has progressed in the last 3 days and is presumed to be a spider bite (possible brown recluse) given the progression and skin necrosis that is present. He presented to his PCP with pain and erythema and a bullae. Dr. Scharlene Gloss PA, Toni Amend, notified me and I was actually able to [facility] her and see the area and recommended going to the hospital for admission.  The ED was able to drain some purulence from the area and pack the wound which is growing GPC.  He reports continued pain, swelling, and drainage from the left thigh. He has never had anything like this prior and says that until Monday the are was smaller like a whitehead and progressed rapidly to the blackened skin noted yesterday.   Past Medical History:  Diagnosis Date  . Alcohol use   . Anxiety   . Chest pain   . Coronary artery disease    a. Nonobstructive CAD by cath 2010 with negative nuclear stress test 05/2013.  . Depression   . Diabetes mellitus   . Hyperlipidemia   . Hypertension   . Hypothyroidism   . Kidney problem    a. 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  . Mild aortic insufficiency   . PAT (paroxysmal atrial tachycardia) (HCC)   . Pericardial effusion    a. by echo 2014.  . Pulmonary nodules    resolved  . PVC's (premature ventricular contractions)   . Stroke (cerebrum) (HCC)   . 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 Wide-complex tachycardia (HCC)    a. per [name] note: Event monitor September 2014 sinus rhythm, occasional PVCs, nonsustained PAT, and one 7 beat run of a wide QRS complex at 110-120 bpm, with minimal changes QRS axis, and QRS morphology not similar to documented PVCs.      Past Surgical History:  Procedure Laterality Date  . BACK SURGERY  1996  . BREAST LUMPECTOMY Left 1979  . CIRCUMCISION  1980  . MANDIBLE SURGERY Right 1969  . PACEMAKER IMPLANT N/A 06/26/2019   Procedure: PACEMAKER IMPLANT;  Surgeon: Hillis Range, MD;  Location: [name];  Service: Cardiovascular;  Laterality: N/A;    Family History  Problem Relation Age of Onset  . Lung cancer Mother   . Diabetes Father   . Bladder Cancer Father   . Colon cancer Brother   . Stroke Maternal Grandmother   . Cancer Maternal Grandfather   . Stroke Paternal Grandmother   . Stroke Paternal Grandfather     Social History   Tobacco Use  . Smoking status: Former Games developer  . Smokeless tobacco: Never Used  Substance Use Topics  . Alcohol use: Not Currently  . Drug use: No    Medications:  I have reviewed the patient's current medications. Prior to Admission:  Medications Prior to Admission  Medication Sig Dispense Refill Last Dose  . acetaminophen (TYLENOL) 500 MG tablet Take 500 mg by mouth every 8 (eight) hours as needed for moderate pain or headache.   unknown  . ALPRAZolam (XANAX) 1 MG tablet Take 1 mg by mouth at bedtime. *May take one additional tablet daily  as needed  2 12/03/2019 at Unknown time  . atorvastatin (LIPITOR) 20 MG tablet Take 20 mg by mouth daily.    12/04/2019 at Unknown time  . buPROPion (WELLBUTRIN SR) 100 MG 12 hr tablet Take 100 mg by mouth every morning.   12/04/2019 at Unknown time  . diphenhydrAMINE (BENADRYL) 25 MG tablet Take 25 mg by mouth daily as needed for allergies.   unknown  . ELIQUIS 5 MG TABS tablet TAKE 1 TABLET BY MOUTH TWICE DAILY (Patient taking differently: Take 5 mg by mouth 2 (two) times daily. ) 60 tablet 6 12/04/2019 at 1000  . gabapentin (NEURONTIN) 600 MG tablet Take 600 mg by mouth 3 (three) times daily.   1 12/04/2019 at Unknown time  . glimepiride (AMARYL) 2 MG tablet Take 2 mg by mouth daily.    12/04/2019 at Unknown time  .  levothyroxine (SYNTHROID) 125 MCG tablet Take 125 mcg by mouth every morning.   12/04/2019 at Unknown time  . metFORMIN (GLUCOPHAGE) 500 MG tablet Take 1 tablet (500 mg total) by mouth See admin instructions. Take 1000 mg in the morning and 500 mg in the evening (Patient taking differently: Take 500-1,000 mg by mouth See admin instructions. Take 1000 mg in the morning and 500 mg in the evening)   12/04/2019 at Unknown time  . nitroGLYCERIN (NITROSTAT) 0.4 MG SL tablet Place 0.4 mg under the tongue every 5 (five) minutes as needed for chest pain.    Past Week at Unknown time  . omega-3 acid ethyl esters (LOVAZA) 1 g capsule Take 2 capsules by mouth 2 (two) times daily.  12 12/04/2019 at Unknown time  . omeprazole (PRILOSEC) 40 MG capsule Take 40 mg by mouth in the morning.    12/04/2019 at Unknown time  . oxyCODONE-acetaminophen (PERCOCET) 10-325 MG tablet Take 1 tablet by mouth 5 (five) times daily.   0 12/04/2019 at Unknown time   Scheduled: . ALPRAZolam  1 mg Oral BID  . apixaban  5 mg Oral BID  . atorvastatin  20 mg Oral q1800  . buPROPion  100 mg Oral q morning - 10a  . gabapentin  600 mg Oral TID  . insulin aspart  0-9 Units Subcutaneous TID PC & HS  . levothyroxine  125 mcg Oral q morning - 10a  . omega-3 acid ethyl esters  2 capsule Oral BID  . pantoprazole  40 mg Oral Daily   Continuous: . sodium chloride 100 mL/hr at 12/05/19 0016  . piperacillin-tazobactam (ZOSYN)  IV 3.375 g (12/05/19 1423)  . vancomycin 1,500 mg (12/05/19 0501)   YQM:VHQIONGEXBMWU **OR** acetaminophen, diphenhydrAMINE, fentaNYL (SUBLIMAZE) injection, magnesium hydroxide, nitroGLYCERIN, ondansetron **OR** ondansetron (ZOFRAN) IV, oxyCODONE-acetaminophen **AND** oxyCODONE, traZODone  Allergies  Allergen Reactions  . Morphine     Affected pts heart   . Sulfonamide Derivatives Rash    blistering     ROS:  A comprehensive review of systems was negative except for: Musculoskeletal: positive for left thigh pain and  swelling  Blood pressure 139/85, pulse 89, temperature 98.5 F (36.9 C), temperature source Axillary, resp. rate 18, height 5\' 9"  (1.753 m), weight 84.9 kg, SpO2 98 %. Physical Exam Vitals reviewed.  Constitutional:      Appearance: He is normal weight.  HENT:     Head: Normocephalic and atraumatic.     Nose: Nose normal.     Mouth/Throat:     Mouth: Mucous membranes are moist.  Eyes:     Extraocular Movements: Extraocular movements intact.  Pupils: Pupils are equal, round, and reactive to light.  Cardiovascular:     Rate and Rhythm: Normal rate.  Pulmonary:     Effort: Pulmonary effort is normal.     Breath sounds: Normal breath sounds.  Abdominal:     General: There is no distension.     Palpations: Abdomen is soft.     Tenderness: There is no abdominal tenderness.  Musculoskeletal:        General: Normal range of motion.     Cervical back: Normal range of motion.     Comments: Left thigh with 5 cm of induration and central area of necrosis of the epidermal layer measuring at least 3cm, open from I&D and packing removed, cavity intact, area probed and surrounding tissue inspected, all appears viable except for the necrotic epidermis that is sloughing, cellulitis extending around circumferenitally  Neurological:     Mental Status: He is alert.         Results: Results for orders placed or performed during the hospital encounter of 12/04/19 (from the past 48 hour(s))  CBC with Differential/Platelet     Status: Abnormal   Collection Time: 12/04/19  5:55 PM  Result Value Ref Range   WBC 7.0 4.0 - 10.5 K/uL   RBC 3.99 (L) 4.22 - 5.81 MIL/uL   Hemoglobin 12.2 (L) 13.0 - 17.0 g/dL   HCT 06.2 (L) 37.6 - 28.3 %   MCV 97.0 80.0 - 100.0 fL   MCH 30.6 26.0 - 34.0 pg   MCHC 31.5 30.0 - 36.0 g/dL   RDW 15.1 76.1 - 60.7 %   Platelets 191 150 - 400 K/uL   nRBC 0.0 0.0 - 0.2 %   Neutrophils Relative % 63 %   Neutro Abs 4.3 1.7 - 7.7 K/uL   Lymphocytes Relative 24 %   Lymphs  Abs 1.7 0.7 - 4.0 K/uL   Monocytes Relative 9 %   Monocytes Absolute 0.6 0.1 - 1.0 K/uL   Eosinophils Relative 4 %   Eosinophils Absolute 0.3 0.0 - 0.5 K/uL   Basophils Relative 0 %   Basophils Absolute 0.0 0.0 - 0.1 K/uL   Immature Granulocytes 0 %   Abs Immature Granulocytes 0.03 0.00 - 0.07 K/uL    Comment: Performed at [facility], [address] [place], [place] [address]  Comprehensive metabolic panel     Status: Abnormal   Collection Time: 12/04/19  5:55 PM  Result Value Ref Range   Sodium 137 135 - 145 mmol/L   Potassium 4.2 3.5 - 5.1 mmol/L   Chloride 103 98 - 111 mmol/L   CO2 26 22 - 32 mmol/L   Glucose, Bld 125 (H) 70 - 99 mg/dL    Comment: Glucose reference range applies only to samples taken after fasting for at least 8 hours.   BUN 15 8 - 23 mg/dL   Creatinine, Ser 2.69 (H) 0.61 - 1.24 mg/dL   Calcium 9.0 8.9 - 48.5 mg/dL   Total Protein 6.8 6.5 - 8.1 g/dL   Albumin 3.6 3.5 - 5.0 g/dL   AST 23 15 - 41 U/L   ALT 25 0 - 44 U/L   Alkaline Phosphatase 70 38 - 126 U/L   Total Bilirubin 0.5 0.3 - 1.2 mg/dL   GFR calc non Af Amer 51 (L) >60 mL/min   GFR calc Af Amer 59 (L) >60 mL/min   Anion gap 8 5 - 15    Comment: Performed at [facility], [address],  [place], [place] [address]  TSH     Status: None   Collection Time: 12/04/19  5:55 PM  Result Value Ref Range   TSH 1.217 0.350 - 4.500 uIU/mL    Comment: Performed by a 3rd Generation assay with a functional sensitivity of <=0.01 uIU/mL. Performed at [facility], [address] [place], [place] [address]   SARS Coronavirus 2 by RT PCR     Status: None   Collection Time: 12/04/19 10:36 PM  Result Value Ref Range   SARS Coronavirus 2 NEGATIVE NEGATIVE    Comment: (NOTE) Result indicates the ABSENCE of SARS-CoV-2 RNA in the patient specimen.  The lowest concentration of SARS-CoV-2 viral copies this assay can detect in nasopharyngeal swab specimens is 500 copies / mL.  A negative result does not preclude  SARS-CoV-2 infection and should not be used as the sole basis for patient management decisions. A negative result may occur with improper specimen collection / handling, submission of a specimen other than nasopharyngeal swab, presence of viral mutation(s) within the areas targeted by this assay, and inadequate number of viral copies (<500 copies / mL) present.  Negative results must be combined with clinical observations, patient history, and epidemiological information.  The expected result is NEGATIVE.  Patient Fact Sheet:  https://wong-henderson.biz/https://www.fda.gov/media/136287/download   Provider Fact Sheet:  CheapJackpot.athttps://www.fda.gov/media/136285/download   This test is not yet approved or cleared by the [place] FDA and  has been authorized for  detection and/or diagnosis of SARS-CoV-2 by FDA under an Emergency Use Authorization (EUA).  This EUA will remain in effect (meaning this test can be used) for the duration of  the COVID-19 declaration under Section 564(b)(1) of the Act, 21 U.S.C. section 360bbb-3(b)(1), unless the authorization is terminated or revoked sooner Performed at [facility], [address] [place], [place] [address]   Basic metabolic panel     Status: Abnormal   Collection Time: 12/05/19  4:56 AM  Result Value Ref Range   Sodium 140 135 - 145 mmol/L   Potassium 4.1 3.5 - 5.1 mmol/L   Chloride 107 98 - 111 mmol/L   CO2 25 22 - 32 mmol/L   Glucose, Bld 158 (H) 70 - 99 mg/dL    Comment: Glucose reference range applies only to samples taken after fasting for at least 8 hours.   BUN 13 8 - 23 mg/dL   Creatinine, Ser 9.561.11 0.61 - 1.24 mg/dL   Calcium 8.9 8.9 - 21.310.3 mg/dL   GFR calc non Af Amer >60 >60 mL/min   GFR calc Af Amer >60 >60 mL/min   Anion gap 8 5 - 15    Comment: Performed at [facility], [address] [place], [place] [address]  CBC     Status: Abnormal   Collection Time: 12/05/19  4:56 AM  Result Value Ref Range   WBC 6.4 4.0 - 10.5 K/uL   RBC 3.98 (L)  4.22 - 5.81 MIL/uL   Hemoglobin 12.3 (L) 13.0 - 17.0 g/dL   HCT 84.637.6 (L) 96.239.0 - 95.252.0 %   MCV 94.5 80.0 - 100.0 fL   MCH 30.9 26.0 - 34.0 pg   MCHC 32.7 30.0 - 36.0 g/dL   RDW 84.113.5 32.411.5 - 40.115.5 %   Platelets 198 150 - 400 K/uL   nRBC 0.0 0.0 - 0.2 %    Comment: Performed at [facility], [address] [place], [place] [address]  Glucose, capillary     Status: Abnormal   Collection Time: 12/05/19  7:28 AM  Result Value Ref Range   Glucose-Capillary 101 (H) 70 - 99 mg/dL    Comment: Glucose reference range applies only to samples taken after fasting for at least 8 hours.  Glucose, capillary     Status: Abnormal   Collection Time: 12/05/19 11:17 AM  Result Value Ref Range   Glucose-Capillary 129 (H) 70 - 99 mg/dL    Comment: Glucose reference range applies only to samples taken after fasting for at least 8 hours.   Personally reviewed- superficial swelling and cellulitis/ edema with small fluid collection prior to ED I&D  CT FEMUR LEFT W CONTRAST  Result Date: 12/04/2019 CLINICAL DATA:  Left thigh abscess. EXAM: CT OF THE LOWER RIGHT EXTREMITY WITH CONTRAST TECHNIQUE: Multidetector CT imaging of the lower right extremity was performed according to the standard protocol following intravenous contrast administration. COMPARISON:  None. CONTRAST:  4mL OMNIPAQUE IOHEXOL 300 MG/ML  SOLN FINDINGS: Mild skin thickening and interstitial changes in the subcutaneous fat of the mid distal thigh suggesting cellulitis. There is also a focal fluid collection in the medial the subcutaneous fat measuring 3.0 x 1.8 cm and consistent with a small abscess. The thigh musculature appears normal. No evidence of myofasciitis or pyomyositis. The hip and knee joints are maintained. Mild degenerative changes and chondrocalcinosis but no findings for septic arthritis. The femur is normal. No evidence of osteomyelitis. IMPRESSION: 1. 3.0 x 1.8 cm subcutaneous abscess in the medial subcutaneous fat of the mid distal thigh.  Mild surrounding cellulitis. 2. No CT findings for myofasciitis or pyomyositis. 3. No CT findings for septic arthritis or osteomyelitis. Electronically Signed   By: Marijo Sanes M.D.   On: 12/04/2019 19:30    Assessment & Plan:  [name] is a 74 y.o. male with what we presume is a spider bite based on the necrotic appears s/p I&D of associated abscess and IV antibiotics for the cellulitis. At this time the tissue aside from the sloughing epidermis appears viable and will not need debridement. I told the patient and his wife that this can continue to evolve, and I will follow the wound and we will decide if surgical debridement is needed in the upcoming days.  -BID packing with saline dampened gauze  -Agree with Vanc for now, GPC in culture  -Discussed with Dr. Wynetta Emery.   All questions were answered to the satisfaction of the patient and family.    Virl Cagey 12/05/2019, 12:16 PM

## 2019-12-05 NOTE — Progress Notes (Signed)
PROGRESS NOTE [facility]   [name]  IRW:[id number]  DOB: Jun 06, 1946  DOA: 12/04/2019 PCP: Celene Squibb, MD   Brief Admission Hx: 74 year old gentleman with hypertension, hypothyroidism, PAF, CVA, dyslipidemia, CAD presented to the emergency department with abscess of the left medial thigh requiring I&D done in the ED.  Patient had surrounding cellulitis that required IV antibiotic treatments.  Patient was admitted for further evaluation and management.  MDM/Assessment & Plan:   1. Abscess of the left medial thigh status post incision and drainage and surrounding cellulitis-patient has a packed wound with iodoform gauze and is receiving warm compresses and IV antibiotic therapy.  General surgery has been consulted and will see the patient.  IV pain medication required.  Continue wound care and close follow-up. 2. AKI-secondary to dehydration-resolved with IV fluids. 3. Cellulitis of the left thigh-patient seems to be responding well to IV antibiotics which we will continue. 4. Type 2 diabetes mellitus-continue SSI coverage and CBG monitoring. 5. Paroxysmal A. fib-continue apixaban 6. Depression-continue Wellbutrin SR as ordered. 7. Hypothyroidism-resume home levothyroxine.  DVT prophylaxis: Apixaban for full anticoagulation Code Status: Full Family Communication: Updated family at bedside during rounds Disposition Plan: Continue IV antibiotics for cellulitis continue wound care   Consultants:  Surgery   Procedures:  n/a  Antimicrobials:  Zosyn vanc   Subjective: Pt reports pain is a little better but still hurts a lot.   Objective: Vitals:   12/04/19 2300 12/05/19 0002 12/05/19 0505 12/05/19 1000  BP: 126/74 (!) 142/69 139/74 139/85  Pulse:  72 63 89  Resp:  18 18 18   Temp:  98.6 F (37 C) 98.5 F (36.9 C)   TempSrc:  Oral Axillary   SpO2:  100% 98%   Weight:  84.9 kg    Height:  5\' 9"  (1.753 m)      Intake/Output Summary (Last 24 hours) at  12/05/2019 1158 Last data filed at 12/05/2019 0900 Gross per 24 hour  Intake 240 ml  Output -  Net 240 ml   Filed Weights   12/04/19 1711 12/05/19 0002  Weight: 84.8 kg 84.9 kg   REVIEW OF SYSTEMS  As per history otherwise all reviewed and reported negative  Exam:  General exam: awake, alert, NAD, cooperative.  Respiratory system: Clear. No increased work of breathing. Cardiovascular system: S1 & S2 heard. No JVD, murmurs, gallops, clicks or pedal edema. Gastrointestinal system: Abdomen is nondistended, soft and nontender. Normal bowel sounds heard. Central nervous system: Alert and oriented. No focal neurological deficits. Extremities: left thigh wound packed with gauze, surrounding erythema, tenderness and induration consistent with cellulitis, warm to touch.  Data Reviewed: Basic Metabolic Panel: Recent Labs  Lab 12/04/19 1755 12/05/19 0456  NA 137 140  K 4.2 4.1  CL 103 107  CO2 26 25  GLUCOSE 125* 158*  BUN 15 13  CREATININE 1.37* 1.11  CALCIUM 9.0 8.9   Liver Function Tests: Recent Labs  Lab 12/04/19 1755  AST 23  ALT 25  ALKPHOS 70  BILITOT 0.5  PROT 6.8  ALBUMIN 3.6   No results for input(s): LIPASE, AMYLASE in the last 168 hours. No results for input(s): AMMONIA in the last 168 hours. CBC: Recent Labs  Lab 12/04/19 1755 12/05/19 0456  WBC 7.0 6.4  NEUTROABS 4.3  --   HGB 12.2* 12.3*  HCT 38.7* 37.6*  MCV 97.0 94.5  PLT 191 198   Cardiac Enzymes: No results for input(s): CKTOTAL, CKMB, CKMBINDEX, TROPONINI in the last 168  hours. CBG (last 3)  Recent Labs    12/05/19 0728 12/05/19 1117  GLUCAP 101* 129*   No results found for this or any previous visit (from the past 240 hour(s)).   Studies: CT FEMUR LEFT W CONTRAST  Result Date: 12/04/2019 CLINICAL DATA:  Left thigh abscess. EXAM: CT OF THE LOWER RIGHT EXTREMITY WITH CONTRAST TECHNIQUE: Multidetector CT imaging of the lower right extremity was performed according to the standard  protocol following intravenous contrast administration. COMPARISON:  None. CONTRAST:  12mL OMNIPAQUE IOHEXOL 300 MG/ML  SOLN FINDINGS: Mild skin thickening and interstitial changes in the subcutaneous fat of the mid distal thigh suggesting cellulitis. There is also a focal fluid collection in the medial the subcutaneous fat measuring 3.0 x 1.8 cm and consistent with a small abscess. The thigh musculature appears normal. No evidence of myofasciitis or pyomyositis. The hip and knee joints are maintained. Mild degenerative changes and chondrocalcinosis but no findings for septic arthritis. The femur is normal. No evidence of osteomyelitis. IMPRESSION: 1. 3.0 x 1.8 cm subcutaneous abscess in the medial subcutaneous fat of the mid distal thigh. Mild surrounding cellulitis. 2. No CT findings for myofasciitis or pyomyositis. 3. No CT findings for septic arthritis or osteomyelitis. Electronically Signed   By: Rudie Meyer M.D.   On: 12/04/2019 19:30   Scheduled Meds: . ALPRAZolam  1 mg Oral BID  . apixaban  5 mg Oral BID  . atorvastatin  20 mg Oral q1800  . buPROPion  100 mg Oral q morning - 10a  . gabapentin  600 mg Oral TID  . glimepiride  2 mg Oral Daily  . insulin aspart  0-9 Units Subcutaneous TID PC & HS  . levothyroxine  125 mcg Oral q morning - 10a  . omega-3 acid ethyl esters  2 capsule Oral BID  . pantoprazole  40 mg Oral Daily   Continuous Infusions: . sodium chloride 100 mL/hr at 12/05/19 0016  . piperacillin-tazobactam (ZOSYN)  IV 3.375 g (12/05/19 0501)  . vancomycin 1,500 mg (12/05/19 0501)    Active Problems:   Cellulitis and abscess of left lower extremity  Time spent:   Standley Dakins, MD [facility] 12/05/2019, 11:58 AM    LOS: 1 day  How to contact the [facility] Attending or Consulting provider 7A - 7P or covering provider during after hours 7P -7A, for this patient?  1. Check the care team in [facility] and look for a) attending/consulting TR[facility] provider listed and b) the [facility]  team listed 2. Log into [email or web address] and use [facility] universal password to access. If you do not have the password, please contact the hospital operator. 3. Locate the [facility] provider you are looking for under [facility] and page to a number that you can be directly reached. 4. If you still have difficulty reaching the provider, please page the [facility] (Director on Call) for the Hospitalists listed on amion for assistance.

## 2019-12-06 ENCOUNTER — Ambulatory Visit: Payer: Medicare Other | Admitting: Cardiology

## 2019-12-06 DIAGNOSIS — L02416 Cutaneous abscess of left lower limb: Secondary | ICD-10-CM | POA: Diagnosis not present

## 2019-12-06 DIAGNOSIS — L03116 Cellulitis of left lower limb: Secondary | ICD-10-CM | POA: Diagnosis not present

## 2019-12-06 LAB — CBC WITH DIFFERENTIAL/PLATELET
Abs Immature Granulocytes: 0.02 10*3/uL (ref 0.00–0.07)
Basophils Absolute: 0 10*3/uL (ref 0.0–0.1)
Basophils Relative: 1 %
Eosinophils Absolute: 0.4 10*3/uL (ref 0.0–0.5)
Eosinophils Relative: 8 %
HCT: 37.3 % — ABNORMAL LOW (ref 39.0–52.0)
Hemoglobin: 11.8 g/dL — ABNORMAL LOW (ref 13.0–17.0)
Immature Granulocytes: 0 %
Lymphocytes Relative: 28 %
Lymphs Abs: 1.4 10*3/uL (ref 0.7–4.0)
MCH: 30.9 pg (ref 26.0–34.0)
MCHC: 31.6 g/dL (ref 30.0–36.0)
MCV: 97.6 fL (ref 80.0–100.0)
Monocytes Absolute: 0.5 10*3/uL (ref 0.1–1.0)
Monocytes Relative: 9 %
Neutro Abs: 2.7 10*3/uL (ref 1.7–7.7)
Neutrophils Relative %: 54 %
Platelets: 175 10*3/uL (ref 150–400)
RBC: 3.82 MIL/uL — ABNORMAL LOW (ref 4.22–5.81)
RDW: 14 % (ref 11.5–15.5)
WBC: 5 10*3/uL (ref 4.0–10.5)
nRBC: 0 % (ref 0.0–0.2)

## 2019-12-06 LAB — GLUCOSE, CAPILLARY
Glucose-Capillary: 84 mg/dL (ref 70–99)
Glucose-Capillary: 150 mg/dL — ABNORMAL HIGH (ref 70–99)
Glucose-Capillary: 159 mg/dL — ABNORMAL HIGH (ref 70–99)
Glucose-Capillary: 115 mg/dL — ABNORMAL HIGH (ref 70–99)

## 2019-12-06 LAB — HEMOGLOBIN A1C
Hgb A1c MFr Bld: 10.4 % — ABNORMAL HIGH (ref 4.8–5.6)
Mean Plasma Glucose: 252 mg/dL

## 2019-12-06 LAB — COMPREHENSIVE METABOLIC PANEL
ALT: 19 U/L (ref 0–44)
AST: 16 U/L (ref 15–41)
Albumin: 3.2 g/dL — ABNORMAL LOW (ref 3.5–5.0)
Alkaline Phosphatase: 55 U/L (ref 38–126)
Anion gap: 7 (ref 5–15)
BUN: 9 mg/dL (ref 8–23)
CO2: 27 mmol/L (ref 22–32)
Calcium: 8.7 mg/dL — ABNORMAL LOW (ref 8.9–10.3)
Chloride: 109 mmol/L (ref 98–111)
Creatinine, Ser: 1.04 mg/dL (ref 0.61–1.24)
GFR calc Af Amer: >60 mL/min (ref >60)
GFR calc non Af Amer: >60 mL/min (ref >60)
Glucose, Bld: 134 mg/dL — ABNORMAL HIGH (ref 70–99)
Potassium: 4.7 mmol/L (ref 3.5–5.1)
Sodium: 143 mmol/L (ref 135–145)
Total Bilirubin: 0.5 mg/dL (ref 0.3–1.2)
Total Protein: 6.1 g/dL — ABNORMAL LOW (ref 6.5–8.1)

## 2019-12-06 MED ORDER — FENTANYL CITRATE (PF) 100 MCG/2ML IJ SOLN
50.0000 ug | INTRAMUSCULAR | Status: DC | PRN
  Administered 2019-12-06 – 2019-12-07 (×6): 50 ug via INTRAVENOUS
  Filled 2019-12-06 (×6): qty 2

## 2019-12-06 MED ORDER — ALPRAZOLAM 1 MG PO TABS
1.0000 mg | ORAL_TABLET | Freq: Two times a day (BID) | ORAL | Status: DC | PRN
  Administered 2019-12-06 – 2019-12-07 (×2): 1 mg via ORAL
  Filled 2019-12-06 (×2): qty 1

## 2019-12-06 MED ORDER — OXYCODONE HCL 5 MG PO TABS
5.0000 mg | ORAL_TABLET | ORAL | Status: DC | PRN
  Administered 2019-12-06 – 2019-12-07 (×4): 5 mg via ORAL
  Filled 2019-12-06 (×4): qty 1

## 2019-12-06 MED ORDER — OXYCODONE-ACETAMINOPHEN 10-325 MG PO TABS: 1.0000 | ORAL_TABLET | ORAL | Status: DC | PRN

## 2019-12-06 MED ORDER — OXYCODONE-ACETAMINOPHEN 5-325 MG PO TABS
1.0000 | ORAL_TABLET | ORAL | Status: DC | PRN
  Administered 2019-12-06 – 2019-12-07 (×4): 1 via ORAL
  Filled 2019-12-06 (×4): qty 1

## 2019-12-06 NOTE — Progress Notes (Signed)
[facility] Progress Note     Subjective: Doing better, says his right leg hurts more than the left right now.   Objective: Vital signs in last 24 hours: Temp:  [97.4 F (36.3 C)] 97.4 F (36.3 C) (04/15 0447) Pulse Rate:  [64-89] 70 (04/15 0447) Resp:  [17-18] 17 (04/15 0447) BP: (121-165)/(62-85) 121/69 (04/15 0447) SpO2:  [96 %-100 %] 100 % (04/15 0447) Last BM Date: 12/03/19  Intake/Output from previous day: 04/14 0701 - 04/15 0700 In: 3263 [P.O.:720; I.V.:2098; IV Piggyback:445] Out: -  Intake/Output this shift: Total I/O In: 240 [P.O.:240] Out: -   General appearance: alert, cooperative and no distress Resp: normal work of breathing GI: soft, non-tender; bowel sounds normal; no masses,  no organomegaly  Left thigh wound with no further signs of necrosis of tissue, drainage on gauze, dressing changed, cellulitis improving medially   Lab Results:  Recent Labs    12/04/19 1755 12/05/19 0456  WBC 7.0 6.4  HGB 12.2* 12.3*  HCT 38.7* 37.6*  PLT 191 198   BMET Recent Labs    12/04/19 1755 12/05/19 0456  NA 137 140  K 4.2 4.1  CL 103 107  CO2 26 25  GLUCOSE 125* 158*  BUN 15 13  CREATININE 1.37* 1.11  CALCIUM 9.0 8.9   PT/INR No results for input(s): LABPROT, INR in the last 72 hours.  Studies/Results: CT FEMUR LEFT W CONTRAST  Result Date: 12/04/2019 CLINICAL DATA:  Left thigh abscess. EXAM: CT OF THE LOWER RIGHT EXTREMITY WITH CONTRAST TECHNIQUE: Multidetector CT imaging of the lower right extremity was performed according to the standard protocol following intravenous contrast administration. COMPARISON:  None. CONTRAST:  47mL OMNIPAQUE IOHEXOL 300 MG/ML  SOLN FINDINGS: Mild skin thickening and interstitial changes in the subcutaneous fat of the mid distal thigh suggesting cellulitis. There is also a focal fluid collection in the medial the subcutaneous fat measuring 3.0 x 1.8 cm and consistent with a small abscess. The thigh  musculature appears normal. No evidence of myofasciitis or pyomyositis. The hip and knee joints are maintained. Mild degenerative changes and chondrocalcinosis but no findings for septic arthritis. The femur is normal. No evidence of osteomyelitis. IMPRESSION: 1. 3.0 x 1.8 cm subcutaneous abscess in the medial subcutaneous fat of the mid distal thigh. Mild surrounding cellulitis. 2. No CT findings for myofasciitis or pyomyositis. 3. No CT findings for septic arthritis or osteomyelitis. Electronically Signed   By: Rudie Meyer M.D.   On: 12/04/2019 19:30    Anti-infectives: Anti-infectives (From admission, onward)   Start     Dose/Rate Route Frequency Ordered Stop   12/05/19 0600  vancomycin (VANCOREADY) IVPB 1500 mg/300 mL     1,500 mg 150 mL/hr over 120 Minutes Intravenous Every 24 hours 12/05/19 0058     12/05/19 0030  piperacillin-tazobactam (ZOSYN) IVPB 3.375 g  Status:  Discontinued     3.375 g 12.5 mL/hr over 240 Minutes Intravenous Every 8 hours 12/05/19 0012 12/05/19 1738   12/05/19 0015  piperacillin-tazobactam (ZOSYN) IVPB 3.375 g  Status:  Discontinued     3.375 g 100 mL/hr over 30 Minutes Intravenous  Once 12/05/19 0006 12/05/19 0012   12/05/19 0015  vancomycin (VANCOCIN) IVPB 1000 mg/200 mL premix  Status:  Discontinued     1,000 mg 200 mL/hr over 60 Minutes Intravenous  Once 12/05/19 0006 12/05/19 0011   12/04/19 1745  vancomycin (VANCOCIN) IVPB 1000 mg/200 mL premix     1,000 mg 200 mL/hr over 60 Minutes Intravenous  Once 12/04/19 1733 12/04/19 1948      Assessment/Plan: [name] is a 74 yo with a left thigh wound s/p I&D likely from a spider bite. Some superficial necrosis but nothing further now. May continue to demarcate. No debridement indicated yet. IV antibiotics for another day.  -Likely home tomorrow with BID saline dampened gauze dressing changes -Will need close monitoring for a week or so to ensure no debridement indicated -Will get him follow up next week   -Cultures with staph and sensitivities pending   Updated Dr. Wynetta Emery.    LOS: 2 days    [name] 12/06/2019

## 2019-12-06 NOTE — Progress Notes (Signed)
PROGRESS NOTE [facility]   SUHAN PACI  YIF:[id number]  DOB: [birth year]  DOA: 12/04/2019 PCP: Celene Squibb, MD   Brief Admission Hx: 74 year old gentleman with hypertension, hypothyroidism, PAF, CVA, dyslipidemia, CAD presented to the emergency department with abscess of the left medial thigh requiring I&D done in the ED.  Patient had surrounding cellulitis that required IV antibiotic treatments.  Patient was admitted for further evaluation and management.  MDM/Assessment & Plan:   1. Abscess of the left medial thigh status post incision and drainage and surrounding cellulitis-patient has a packed wound with gauze and is receiving warm compresses and IV antibiotic therapy.  General surgery has been consulted and following.  Continue IV pain antibiotics for another 24 hours.    Continue wound care and close follow-up.  Anticipating possible DC home 4/16.  2. AKI-secondary to dehydration-resolved with IV fluids. 3. Cellulitis of the left thigh-patient seems to be responding well to IV antibiotics which we will continue. 4. Type 2 diabetes mellitus-continue SSI coverage and CBG monitoring. 5. Paroxysmal A. fib-continue apixaban 6. Depression-continue Wellbutrin SR as ordered. 7. Hypothyroidism-resume home levothyroxine.  DVT prophylaxis: Apixaban for full anticoagulation Code Status: Full Family Communication: Updated family at bedside during rounds Disposition Plan: Continue IV antibiotics for cellulitis continue wound care, anticipate DC home 4/16 when surgery clears   Consultants:  Surgery   Procedures:  n/a  Antimicrobials:  Zosyn vanc   Subjective: Pain is NOT controlled today.    Objective: Vitals:   12/05/19 2049 12/05/19 2201 12/06/19 0447 12/06/19 1336  BP:  137/62 121/69 129/62  Pulse:  67 70 64  Resp:  18 17 18   Temp:  (!) 97.4 F (36.3 C) (!) 97.4 F (36.3 C) 98 F (36.7 C)  TempSrc:   Oral   SpO2: 98% 99% 100% 100%  Weight:       Height:        Intake/Output Summary (Last 24 hours) at 12/06/2019 1421 Last data filed at 12/06/2019 0900 Gross per 24 hour  Intake 3023.02 ml  Output --  Net 3023.02 ml   Filed Weights   12/04/19 1711 12/05/19 0002  Weight: 84.8 kg 84.9 kg   REVIEW OF SYSTEMS  As per history otherwise all reviewed and reported negative  Exam:  General exam: awake, alert, NAD, cooperative.  Respiratory system: Clear. No increased work of breathing. Cardiovascular system: S1 & S2 heard. No JVD, murmurs, gallops, clicks or pedal edema. Gastrointestinal system: Abdomen is nondistended, soft and nontender. Normal bowel sounds heard. Central nervous system: Alert and oriented. No focal neurological deficits. Extremities: left thigh wound packed with gauze, surrounding erythema, tenderness and induration consistent with cellulitis, improving redness today.   Data Reviewed: Basic Metabolic Panel: Recent Labs  Lab 12/04/19 1755 12/05/19 0456 12/06/19 0952  NA 137 140 143  K 4.2 4.1 4.7  CL 103 107 109  CO2 26 25 27   GLUCOSE 125* 158* 134*  BUN 15 13 9   CREATININE 1.37* 1.11 1.04  CALCIUM 9.0 8.9 8.7*   Liver Function Tests: Recent Labs  Lab 12/04/19 1755 12/06/19 0952  AST 23 16  ALT 25 19  ALKPHOS 70 55  BILITOT 0.5 0.5  PROT 6.8 6.1*  ALBUMIN 3.6 3.2*   No results for input(s): LIPASE, AMYLASE in the last 168 hours. No results for input(s): AMMONIA in the last 168 hours. CBC: Recent Labs  Lab 12/04/19 1755 12/05/19 0456 12/06/19 0952  WBC 7.0 6.4 5.0  NEUTROABS 4.3  --  2.7  HGB 12.2* 12.3* 11.8*  HCT 38.7* 37.6* 37.3*  MCV 97.0 94.5 97.6  PLT 191 198 175   Cardiac Enzymes: No results for input(s): CKTOTAL, CKMB, CKMBINDEX, TROPONINI in the last 168 hours. CBG (last 3)  Recent Labs    12/05/19 2050 12/06/19 0720 12/06/19 1125  GLUCAP 162* 84 150*   Recent Results (from the past 240 hour(s))  SARS Coronavirus 2 by RT PCR     Status: None   Collection Time:  12/04/19 10:36 PM  Result Value Ref Range Status   SARS Coronavirus 2 NEGATIVE NEGATIVE Final    Comment: (NOTE) Result indicates the ABSENCE of SARS-CoV-2 RNA in the patient specimen.  The lowest concentration of SARS-CoV-2 viral copies this assay can detect in nasopharyngeal swab specimens is 500 copies / mL.  A negative result does not preclude SARS-CoV-2 infection and should not be used as the sole basis for patient management decisions. A negative result may occur with improper specimen collection / handling, submission of a specimen other than nasopharyngeal swab, presence of viral mutation(s) within the areas targeted by this assay, and inadequate number of viral copies (<500 copies / mL) present.  Negative results must be combined with clinical observations, patient history, and epidemiological information.  The expected result is NEGATIVE.  Patient Fact Sheet:  https://wong-henderson.biz/   Provider Fact Sheet:  CheapJackpot.at   This test is not yet approved or cleared by the [place] FDA and  has been authorized for  detection and/or diagnosis of SARS-CoV-2 by FDA under an Emergency Use Authorization (EUA).  This EUA will remain in effect (meaning this test can be used) for the duration of  the COVID-19 declaration under Section 564(b)(1) of the Act, 21 U.S.C. section 360bbb-3(b)(1), unless the authorization is terminated or revoked sooner Performed at [facility], [address], [place], [place] [address]   Wound or Superficial Culture     Status: None (Preliminary result)   Collection Time: 12/04/19 10:39 PM   Specimen: Wound  Result Value Ref Range Status   Specimen Description   Final    WOUND Performed at [facility], [address], [place], [place] [address]    Special Requests   Final    LEFT LEG Performed at [facility], [address], [place], [place] [address]    Gram Stain   Final    ABUNDANT WBC  PRESENT,BOTH PMN AND MONONUCLEAR ABUNDANT GRAM POSITIVE COCCI    Culture   Final    ABUNDANT STAPHYLOCOCCUS AUREUS SUSCEPTIBILITIES TO FOLLOW Performed at [facility], [address] [place], [place] [address]    Report Status PENDING  Incomplete     Studies: CT FEMUR LEFT W CONTRAST  Result Date: 12/04/2019 CLINICAL DATA:  Left thigh abscess. EXAM: CT OF THE LOWER RIGHT EXTREMITY WITH CONTRAST TECHNIQUE: Multidetector CT imaging of the lower right extremity was performed according to the standard protocol following intravenous contrast administration. COMPARISON:  None. CONTRAST:  30mL OMNIPAQUE IOHEXOL 300 MG/ML  SOLN FINDINGS: Mild skin thickening and interstitial changes in the subcutaneous fat of the mid distal thigh suggesting cellulitis. There is also a focal fluid collection in the medial the subcutaneous fat measuring 3.0 x 1.8 cm and consistent with a small abscess. The thigh musculature appears normal. No evidence of myofasciitis or pyomyositis. The hip and knee joints are maintained. Mild degenerative changes and chondrocalcinosis but no findings for septic arthritis. The femur is normal. No evidence of osteomyelitis. IMPRESSION: 1.  3.0 x 1.8 cm subcutaneous abscess in the medial subcutaneous fat of the mid distal thigh. Mild surrounding cellulitis. 2. No CT findings for myofasciitis or pyomyositis. 3. No CT findings for septic arthritis or osteomyelitis. Electronically Signed   By: Rudie Meyer M.D.   On: 12/04/2019 19:30   Scheduled Meds: . apixaban  5 mg Oral BID  . atorvastatin  20 mg Oral q1800  . buPROPion  100 mg Oral q morning - 10a  . gabapentin  600 mg Oral TID  . insulin aspart  0-9 Units Subcutaneous TID PC & HS  . levothyroxine  125 mcg Oral q morning - 10a  . omega-3 acid ethyl esters  2 capsule Oral BID  . pantoprazole  40 mg Oral Daily  . saccharomyces boulardii  250 mg Oral BID   Continuous Infusions: . sodium chloride 10 mL/hr at 12/06/19 1143  .  vancomycin 1,500 mg (12/06/19 0448)    Active Problems:   Cellulitis and abscess of left lower extremity  Time spent:   Standley Dakins, MD [facility] 12/06/2019, 2:21 PM    LOS: 2 days  How to contact the [facility] Attending or Consulting provider 7A - 7P or covering provider during after hours 7P -7A, for this patient?  1. Check the care team in [facility] and look for a) attending/consulting [facility] provider listed and b) the [facility] team listed 2. Log into [email or web address] and use [facility] universal password to access. If you do not have the password, please contact the hospital operator. 3. Locate the [facility] provider you are looking for under [facility] and page to a number that you can be directly reached. 4. If you still have difficulty reaching the provider, please page the [facility] (Director on Call) for the Hospitalists listed on amion for assistance.

## 2019-12-07 ENCOUNTER — Encounter: Payer: Medicare Other | Admitting: Internal Medicine

## 2019-12-07 DIAGNOSIS — L03116 Cellulitis of left lower limb: Secondary | ICD-10-CM | POA: Diagnosis not present

## 2019-12-07 DIAGNOSIS — A4902 Methicillin resistant Staphylococcus aureus infection, unspecified site: Secondary | ICD-10-CM

## 2019-12-07 DIAGNOSIS — L02416 Cutaneous abscess of left lower limb: Secondary | ICD-10-CM | POA: Diagnosis not present

## 2019-12-07 LAB — AEROBIC CULTURE W GRAM STAIN (SUPERFICIAL SPECIMEN)

## 2019-12-07 LAB — CBC WITH DIFFERENTIAL/PLATELET
Abs Immature Granulocytes: 0.03 10*3/uL (ref 0.00–0.07)
Basophils Absolute: 0 10*3/uL (ref 0.0–0.1)
Basophils Relative: 1 %
Eosinophils Absolute: 0.4 10*3/uL (ref 0.0–0.5)
Eosinophils Relative: 8 %
HCT: 34.8 % — ABNORMAL LOW (ref 39.0–52.0)
Hemoglobin: 11.2 g/dL — ABNORMAL LOW (ref 13.0–17.0)
Immature Granulocytes: 1 %
Lymphocytes Relative: 30 %
Lymphs Abs: 1.6 10*3/uL (ref 0.7–4.0)
MCH: 31.2 pg (ref 26.0–34.0)
MCHC: 32.2 g/dL (ref 30.0–36.0)
MCV: 96.9 fL (ref 80.0–100.0)
Monocytes Absolute: 0.5 10*3/uL (ref 0.1–1.0)
Monocytes Relative: 9 %
Neutro Abs: 2.8 10*3/uL (ref 1.7–7.7)
Neutrophils Relative %: 51 %
Platelets: 169 10*3/uL (ref 150–400)
RBC: 3.59 MIL/uL — ABNORMAL LOW (ref 4.22–5.81)
RDW: 13.9 % (ref 11.5–15.5)
WBC: 5.3 10*3/uL (ref 4.0–10.5)
nRBC: 0 % (ref 0.0–0.2)

## 2019-12-07 LAB — GLUCOSE, CAPILLARY
Glucose-Capillary: 97 mg/dL (ref 70–99)
Glucose-Capillary: 93 mg/dL (ref 70–99)

## 2019-12-07 MED ORDER — DOXYCYCLINE HYCLATE 100 MG PO CAPS: 100.0000 mg | ORAL_CAPSULE | Freq: Two times a day (BID) | ORAL | 0 refills | Status: DC

## 2019-12-07 MED ORDER — METFORMIN HCL 500 MG PO TABS: 500.0000 mg | ORAL_TABLET | ORAL | Status: DC

## 2019-12-07 MED ORDER — TETANUS-DIPHTH-ACELL PERTUSSIS 5-2.5-18.5 LF-MCG/0.5 IM SUSP
0.5000 mL | Freq: Once | INTRAMUSCULAR | Status: AC
  Administered 2019-12-07: 16:00:00 0.5 mL via INTRAMUSCULAR
  Filled 2019-12-07: qty 0.5

## 2019-12-07 NOTE — Care Management Important Message (Signed)
Important Message  Patient Details  Name: [name] MRN: [id number] Date of Birth: 08/06/1946   Medicare Important Message Given:  Yes     Corey Harold 12/07/2019, 12:40 PM

## 2019-12-07 NOTE — Discharge Instructions (Signed)
Wound: Twice daily saline dampened gauze to the wound and cover with dry dressing or bandage. Follow up with Dr. Thelma Barge Wednesday to follow the wound.   Contact Information: If you have questions or concerns, please call our office, [phone], Monday- Thursday 8AM-5PM and Friday 8AM-12Noon.  If it is after hours or on the weekend, please call [facility] Main Number, [phone], and ask to speak to the surgeon on call for Dr. Henreitta Leber at [facility].

## 2019-12-07 NOTE — Discharge Summary (Signed)
Physician Discharge Summary  [name] WGN:[id number] DOB: 02/03/1946 DOA: 12/04/2019  PCP: Benita Stabile, MD  Admit date: 12/04/2019 Discharge date: 12/07/2019  Admitted From:  Home  Disposition:  Home   Recommendations for Outpatient Follow-up:  1. Follow up with Dr. Thelma Barge for wound care in 3 days as already scheduled 2. Tetanus Booster given  Prior to discharge from hospital 12/07/19.    Wound Care Instructions:  Wound: Twice daily saline dampened gauze to the wound and cover with dry dressing or bandage. Follow up with Dr. Thelma Barge Wednesday to follow the wound.   Contact Information: If you have questions or concerns, please call our office, [phone], Monday- Thursday 8AM-5PM and Friday 8AM-12Noon.  If it is after hours or on the weekend, please call [facility] Main Number, [phone], and ask to speak to the surgeon on call for Dr. Henreitta Leber at [facility].   Home Health: RN for wound care  Discharge Condition: STABLE   CODE STATUS: FULL    Brief Hospitalization Summary: Please see all hospital notes, images, labs for full details of the hospitalization.  Admission HPI:  [name]  is a 74 y.o. Caucasian male with a known history of hypertension, dyslipidemia, hypothyroidism, paroxysmal atrial fibrillation CVA and nonobstructive coronary artery disease, presented to the emergency room with acute onset of left medial thigh swelling with erythema, warmth tenderness and pain over the last couple of days which has become throbbing today.  He was seen by his primary care physician who discussed the case with Dr. Henreitta Leber  and both recommended referral to the ER physician for further management with IV antibiotics.  The patient denies any fever or chills.  No chest pain or dyspnea cough.  He had nausea and vomiting after p.o. Percocet in the ER.  Upon presentation to the emergency room vital signs revealed blood pressure 147/83 with a heart rate of 47 and later 88 and  otherwise normal vital signs.  Labs revealed a creatinine of 1.37 compared to 1.16 on 06/11/2019 with otherwise unremarkable CMP and CBC showed minimal anemia.  The patient had an I&D of a left thigh abscess by the ER physician.  He was given IV vancomycin and p.o. Percocet.  Lidocaine with epi was used to perform the I&D.  The patient will be admitted to a medical  bed for further evaluation and management of his surrounding severe cellulitis.  Brief Admission Hx: 74 year old gentleman with hypertension, hypothyroidism, PAF, CVA, dyslipidemia, CAD presented to the emergency department with abscess of the left medial thigh requiring I&D done in the ED.  Patient had surrounding cellulitis that required IV antibiotic treatments.  Patient was admitted for further evaluation and management.  MDM/Assessment & Plan:   1. MRSA Abscess of the left medial thigh status post incision and drainage and surrounding cellulitis-patient has a packed wound with gauze and received warm compresses and IV antibiotic therapy.  General surgery has been consulted and following.  He responded well to IV vancomycin.  Cultures positive for MRSA.     Continue wound care and close outpatient follow-up.  He has outpatient wound care appointment with Dr. Thelma Barge on Wednesday 12/12/19.   2. AKI-secondary to dehydration-resolved with IV fluids. 3. MRSA Cellulitis of the left thigh-patient seems to be responding well to IV antibiotics.  DC on oral doxycycline to complete a full 14 day course of therapy. 4. Type 2 diabetes mellitus-continue SSI coverage and CBG monitoring. 5. Paroxysmal A. fib-continue apixaban 6. Depression-continue Wellbutrin SR as ordered. 7.  Hypothyroidism-resumed home levothyroxine.  DVT prophylaxis: Apixaban for full anticoagulation Code Status: Full Family Communication: wife family at bedside during rounds Disposition Plan: Home    Consultants:  Surgery     Discharge Diagnoses:  Active  Problems:   Cellulitis and abscess of left lower extremity   Discharge Instructions:  Allergies as of 12/07/2019      Reactions   Morphine    Affected pts heart    Sulfonamide Derivatives Rash   blistering      Medication List    TAKE these medications   acetaminophen 500 MG tablet Commonly known as: TYLENOL Take 500 mg by mouth every 8 (eight) hours as needed for moderate pain or headache.   ALPRAZolam 1 MG tablet Commonly known as: XANAX Take 1 mg by mouth at bedtime. *May take one additional tablet daily as needed   atorvastatin 20 MG tablet Commonly known as: LIPITOR Take 20 mg by mouth daily.   buPROPion 100 MG 12 hr tablet Commonly known as: WELLBUTRIN SR Take 100 mg by mouth every morning.   diphenhydrAMINE 25 MG tablet Commonly known as: BENADRYL Take 25 mg by mouth daily as needed for allergies.   doxycycline 100 MG capsule Commonly known as: VIBRAMYCIN Take 1 capsule (100 mg total) by mouth 2 (two) times daily.   Eliquis 5 MG Tabs tablet Generic drug: apixaban TAKE 1 TABLET BY MOUTH TWICE DAILY What changed: how much to take   gabapentin 600 MG tablet Commonly known as: NEURONTIN Take 600 mg by mouth 3 (three) times daily.   glimepiride 2 MG tablet Commonly known as: AMARYL Take 2 mg by mouth daily.   levothyroxine 125 MCG tablet Commonly known as: SYNTHROID Take 125 mcg by mouth every morning.   metFORMIN 500 MG tablet Commonly known as: GLUCOPHAGE Take 1-2 tablets (500-1,000 mg total) by mouth See admin instructions. Take 1000 mg in the morning and 500 mg in the evening   nitroGLYCERIN 0.4 MG SL tablet Commonly known as: NITROSTAT Place 0.4 mg under the tongue every 5 (five) minutes as needed for chest pain.   omega-3 acid ethyl esters 1 g capsule Commonly known as: LOVAZA Take 2 capsules by mouth 2 (two) times daily.   omeprazole 40 MG capsule Commonly known as: PRILOSEC Take 40 mg by mouth in the morning.   oxyCODONE-acetaminophen  10-325 MG tablet Commonly known as: PERCOCET Take 1 tablet by mouth 5 (five) times daily.      Follow-up Information    Benita Stabile, MD .   Specialty: Internal Medicine Contact information: [address] [place] [address] [phone]        Antoine Poche, MD .   Specialty: Cardiology Contact information: [address] [place] [address] [phone]          Allergies  Allergen Reactions  . Morphine     Affected pts heart   . Sulfonamide Derivatives Rash    blistering   Allergies as of 12/07/2019      Reactions   Morphine    Affected pts heart    Sulfonamide Derivatives Rash   blistering      Medication List    TAKE these medications   acetaminophen 500 MG tablet Commonly known as: TYLENOL Take 500 mg by mouth every 8 (eight) hours as needed for moderate pain or headache.   ALPRAZolam 1 MG tablet Commonly known as: XANAX Take 1 mg by mouth at bedtime. *May take one additional tablet daily  as needed   atorvastatin 20 MG tablet Commonly known as: LIPITOR Take 20 mg by mouth daily.   buPROPion 100 MG 12 hr tablet Commonly known as: WELLBUTRIN SR Take 100 mg by mouth every morning.   diphenhydrAMINE 25 MG tablet Commonly known as: BENADRYL Take 25 mg by mouth daily as needed for allergies.   doxycycline 100 MG capsule Commonly known as: VIBRAMYCIN Take 1 capsule (100 mg total) by mouth 2 (two) times daily.   Eliquis 5 MG Tabs tablet Generic drug: apixaban TAKE 1 TABLET BY MOUTH TWICE DAILY What changed: how much to take   gabapentin 600 MG tablet Commonly known as: NEURONTIN Take 600 mg by mouth 3 (three) times daily.   glimepiride 2 MG tablet Commonly known as: AMARYL Take 2 mg by mouth daily.   levothyroxine 125 MCG tablet Commonly known as: SYNTHROID Take 125 mcg by mouth every morning.   metFORMIN 500 MG tablet Commonly known as: GLUCOPHAGE Take 1-2 tablets (500-1,000 mg total) by mouth See admin  instructions. Take 1000 mg in the morning and 500 mg in the evening   nitroGLYCERIN 0.4 MG SL tablet Commonly known as: NITROSTAT Place 0.4 mg under the tongue every 5 (five) minutes as needed for chest pain.   omega-3 acid ethyl esters 1 g capsule Commonly known as: LOVAZA Take 2 capsules by mouth 2 (two) times daily.   omeprazole 40 MG capsule Commonly known as: PRILOSEC Take 40 mg by mouth in the morning.   oxyCODONE-acetaminophen 10-325 MG tablet Commonly known as: PERCOCET Take 1 tablet by mouth 5 (five) times daily.       Procedures/Studies: CT FEMUR LEFT W CONTRAST  Result Date: 12/04/2019 CLINICAL DATA:  Left thigh abscess. EXAM: CT OF THE LOWER RIGHT EXTREMITY WITH CONTRAST TECHNIQUE: Multidetector CT imaging of the lower right extremity was performed according to the standard protocol following intravenous contrast administration. COMPARISON:  None. CONTRAST:  45mL OMNIPAQUE IOHEXOL 300 MG/ML  SOLN FINDINGS: Mild skin thickening and interstitial changes in the subcutaneous fat of the mid distal thigh suggesting cellulitis. There is also a focal fluid collection in the medial the subcutaneous fat measuring 3.0 x 1.8 cm and consistent with a small abscess. The thigh musculature appears normal. No evidence of myofasciitis or pyomyositis. The hip and knee joints are maintained. Mild degenerative changes and chondrocalcinosis but no findings for septic arthritis. The femur is normal. No evidence of osteomyelitis. IMPRESSION: 1. 3.0 x 1.8 cm subcutaneous abscess in the medial subcutaneous fat of the mid distal thigh. Mild surrounding cellulitis. 2. No CT findings for myofasciitis or pyomyositis. 3. No CT findings for septic arthritis or osteomyelitis. Electronically Signed   By: Rudie Meyer M.D.   On: 12/04/2019 19:30      Subjective: Pt reporting that he feels better, pain is better controlled.  Wound looking better.   Discharge Exam: Vitals:   12/07/19 0850 12/07/19 1300   BP:  140/79  Pulse:  69  Resp:  18  Temp:  98.6 F (37 C)  SpO2: 97% 96%   Vitals:   12/06/19 2103 12/07/19 0459 12/07/19 0850 12/07/19 1300  BP: (!) 142/68 (!) 160/76  140/79  Pulse: 74 65  69  Resp: 16 16  18   Temp: 97.6 F (36.4 C) 98.4 F (36.9 C)  98.6 F (37 C)  TempSrc: Oral Oral  Oral  SpO2: 100% 98% 97% 96%  Weight:      Height:        General: Pt  is alert, awake, not in acute distress Cardiovascular: normal S1/S2 +, no rubs, no gallops Respiratory: CTA bilaterally, no wheezing, no rhonchi Abdominal: Soft, NT, ND, bowel sounds + Extremities: left leg wound examined and shows that surrounding erythema has resolved, wound was packed with gauze but no drainage was seen.    The results of significant diagnostics from this hospitalization (including imaging, microbiology, ancillary and laboratory) are listed below for reference.     Microbiology: Recent Results (from the past 240 hour(s))  SARS Coronavirus 2 by RT PCR     Status: None   Collection Time: 12/04/19 10:36 PM  Result Value Ref Range Status   SARS Coronavirus 2 NEGATIVE NEGATIVE Final    Comment: (NOTE) Result indicates the ABSENCE of SARS-CoV-2 RNA in the patient specimen.  The lowest concentration of SARS-CoV-2 viral copies this assay can detect in nasopharyngeal swab specimens is 500 copies / mL.  A negative result does not preclude SARS-CoV-2 infection and should not be used as the sole basis for patient management decisions. A negative result may occur with improper specimen collection / handling, submission of a specimen other than nasopharyngeal swab, presence of viral mutation(s) within the areas targeted by this assay, and inadequate number of viral copies (<500 copies / mL) present.  Negative results must be combined with clinical observations, patient history, and epidemiological information.  The expected result is NEGATIVE.  Patient Fact Sheet:   BlogSelections.co.uk   Provider Fact Sheet:  https://lucas.com/   This test is not yet approved or cleared by the [place] FDA and  has been authorized for  detection and/or diagnosis of SARS-CoV-2 by FDA under an Emergency Use Authorization (EUA).  This EUA will remain in effect (meaning this test can be used) for the duration of  the COVID-19 declaration under Section 564(b)(1) of the Act, 21 U.S.C. section 360bbb-3(b)(1), unless the authorization is terminated or revoked sooner Performed at [facility], [place] [address] [place], [place] [address]   Wound or Superficial Culture     Status: None   Collection Time: 12/04/19 10:39 PM   Specimen: Wound  Result Value Ref Range Status   Specimen Description   Final    WOUND Performed at [facility], [address], [place], [place] [address]    Special Requests   Final    LEFT LEG Performed at [facility], [address] [place], [place] [address]    Gram Stain   Final    ABUNDANT WBC PRESENT,BOTH PMN AND MONONUCLEAR ABUNDANT GRAM POSITIVE COCCI Performed at [facility], [place] [address] [place],  [address]    Culture   Final    ABUNDANT METHICILLIN RESISTANT STAPHYLOCOCCUS AUREUS   Report Status 12/07/2019 FINAL  Final   Organism ID, Bacteria METHICILLIN RESISTANT STAPHYLOCOCCUS AUREUS  Final      Susceptibility   Methicillin resistant staphylococcus aureus - MIC*    CIPROFLOXACIN >=8 RESISTANT Resistant     ERYTHROMYCIN >=8 RESISTANT Resistant     GENTAMICIN <=0.5 SENSITIVE Sensitive     OXACILLIN >=4 RESISTANT Resistant     TETRACYCLINE <=1 SENSITIVE Sensitive     VANCOMYCIN <=0.5 SENSITIVE Sensitive     TRIMETH/SULFA 160 RESISTANT Resistant     CLINDAMYCIN >=8 RESISTANT Resistant     RIFAMPIN <=0.5 SENSITIVE Sensitive     Inducible Clindamycin NEGATIVE Sensitive     * ABUNDANT METHICILLIN RESISTANT STAPHYLOCOCCUS AUREUS     Labs: BNP (last 3  results) No results for input(s):  BNP in the last 8760 hours. Basic Metabolic Panel: Recent Labs  Lab 12/04/19 1755 12/05/19 0456 12/06/19 0952  NA 137 140 143  K 4.2 4.1 4.7  CL 103 107 109  CO2 26 25 27   GLUCOSE 125* 158* 134*  BUN 15 13 9   CREATININE 1.37* 1.11 1.04  CALCIUM 9.0 8.9 8.7*   Liver Function Tests: Recent Labs  Lab 12/04/19 1755 12/06/19 0952  AST 23 16  ALT 25 19  ALKPHOS 70 55  BILITOT 0.5 0.5  PROT 6.8 6.1*  ALBUMIN 3.6 3.2*   No results for input(s): LIPASE, AMYLASE in the last 168 hours. No results for input(s): AMMONIA in the last 168 hours. CBC: Recent Labs  Lab 12/04/19 1755 12/05/19 0456 12/06/19 0952 12/07/19 0604  WBC 7.0 6.4 5.0 5.3  NEUTROABS 4.3  --  2.7 2.8  HGB 12.2* 12.3* 11.8* 11.2*  HCT 38.7* 37.6* 37.3* 34.8*  MCV 97.0 94.5 97.6 96.9  PLT 191 198 175 169   Cardiac Enzymes: No results for input(s): CKTOTAL, CKMB, CKMBINDEX, TROPONINI in the last 168 hours. BNP: Invalid input(s): POCBNP CBG: Recent Labs  Lab 12/06/19 1125 12/06/19 1613 12/06/19 2121 12/07/19 0823 12/07/19 1235  GLUCAP 150* 159* 115* 97 93   D-Dimer No results for input(s): DDIMER in the last 72 hours. Hgb A1c Recent Labs    12/04/19 1755  HGBA1C 10.4*   Lipid Profile No results for input(s): CHOL, HDL, LDLCALC, TRIG, CHOLHDL, LDLDIRECT in the last 72 hours. Thyroid function studies Recent Labs    12/04/19 1755  TSH 1.217   Anemia work up No results for input(s): VITAMINB12, FOLATE, FERRITIN, TIBC, IRON, RETICCTPCT in the last 72 hours. Urinalysis No results found for: COLORURINE, APPEARANCEUR, LABSPEC, PHURINE, GLUCOSEU, HGBUR, BILIRUBINUR, KETONESUR, PROTEINUR, UROBILINOGEN, NITRITE, LEUKOCYTESUR Sepsis Labs Invalid input(s): PROCALCITONIN,  WBC,  LACTICIDVEN Microbiology Recent Results (from the past 240 hour(s))  SARS Coronavirus 2 by RT PCR     Status: None   Collection Time: 12/04/19 10:36 PM  Result Value Ref Range Status    SARS Coronavirus 2 NEGATIVE NEGATIVE Final    Comment: (NOTE) Result indicates the ABSENCE of SARS-CoV-2 RNA in the patient specimen.  The lowest concentration of SARS-CoV-2 viral copies this assay can detect in nasopharyngeal swab specimens is 500 copies / mL.  A negative result does not preclude SARS-CoV-2 infection and should not be used as the sole basis for patient management decisions. A negative result may occur with improper specimen collection / handling, submission of a specimen other than nasopharyngeal swab, presence of viral mutation(s) within the areas targeted by this assay, and inadequate number of viral copies (<500 copies / mL) present.  Negative results must be combined with clinical observations, patient history, and epidemiological information.  The expected result is NEGATIVE.  Patient Fact Sheet:  https://wong-henderson.biz/https://www.fda.gov/media/136287/download   Provider Fact Sheet:  CheapJackpot.athttps://www.fda.gov/media/136285/download   This test is not yet approved or cleared by the [place] FDA and  has been authorized for  detection and/or diagnosis of SARS-CoV-2 by FDA under an Emergency Use Authorization (EUA).  This EUA will remain in effect (meaning this test can be used) for the duration of  the COVID-19 declaration under Section 564(b)(1) of the Act, 21 U.S.C. section 360bbb-3(b)(1), unless the authorization is terminated or revoked sooner Performed at [facility], [address] [place], [place] [address]   Wound or Superficial Culture     Status: None   Collection Time: 12/04/19 10:39 PM  Specimen: Wound  Result Value Ref Range Status   Specimen Description   Final    WOUND Performed at [facility], [address] [place], [place] [address]    Special Requests   Final    LEFT LEG Performed at [facility], [address] [place], [place] [address]    Gram Stain   Final    ABUNDANT WBC PRESENT,BOTH PMN AND MONONUCLEAR ABUNDANT GRAM POSITIVE COCCI Performed  at [facility], [address] [place], [place] [address]    Culture   Final    ABUNDANT METHICILLIN RESISTANT STAPHYLOCOCCUS AUREUS   Report Status 12/07/2019 FINAL  Final   Organism ID, Bacteria METHICILLIN RESISTANT STAPHYLOCOCCUS AUREUS  Final      Susceptibility   Methicillin resistant staphylococcus aureus - MIC*    CIPROFLOXACIN >=8 RESISTANT Resistant     ERYTHROMYCIN >=8 RESISTANT Resistant     GENTAMICIN <=0.5 SENSITIVE Sensitive     OXACILLIN >=4 RESISTANT Resistant     TETRACYCLINE <=1 SENSITIVE Sensitive     VANCOMYCIN <=0.5 SENSITIVE Sensitive     TRIMETH/SULFA 160 RESISTANT Resistant     CLINDAMYCIN >=8 RESISTANT Resistant     RIFAMPIN <=0.5 SENSITIVE Sensitive     Inducible Clindamycin NEGATIVE Sensitive     * ABUNDANT METHICILLIN RESISTANT STAPHYLOCOCCUS AUREUS   Time coordinating discharge: 32 mins  SIGNED:  Standley Dakins, MD  [facility] 12/07/2019, 2:32 PM How to contact the [facility] Attending or Consulting provider 7A - 7P or covering provider during after hours 7P -7A, for this patient?  1. Check the care team in [facility] and look for a) attending/consulting [facility] provider listed and b) the [facility] team listed 2. Log into [email or web address] and use [facility] universal password to access. If you do not have the password, please contact the hospital operator. 3. Locate the [facility] provider you are looking for under [facility] and page to a number that you can be directly reached. 4. If you still have difficulty reaching the provider, please page the [facility] (Director on Call) for the Hospitalists listed on amion for assistance.

## 2019-12-07 NOTE — Progress Notes (Addendum)
[facility]  Wound stable. No necrotic tissue needing debridement currently. Packing going ok. Has chronic pain medication. Cultures growing MRSA so will need antibiotics to cover for at least 14 day total course.  Need to make sure has tetanus up to date also.    Future Appointments  Date Time Provider Department Center  12/12/2019  9:30 AM Hulda Marin, MD [facility] None  12/25/2019  7:30 AM [facility] DEVICE REMOTES [facility]  12/28/2019  9:45 AM Allred, Fayrene Fearing, MD [facility]  01/29/2020  3:40 PM Wyline Mood Dorothe Pea, MD [facility]  03/25/2020  7:30 AM [facility] DEVICE REMOTES [facility]  06/24/2020  7:30 AM [facility] DEVICE REMOTES [facility]   If wound worsens the patient knows to let me know for debridement. Dr.Oaks maybe able to do some degree in the clinic versus me doing debridement in the OR.  Algis Greenhouse, MD [facility] [address] [place], [place] [address] [phone] (office)

## 2019-12-07 NOTE — Progress Notes (Signed)
Nsg Discharge Note  Admit Date:  12/04/2019 Discharge date: 12/07/2019   Lona Kettle to be D/C'd Home per MD order.  AVS completed.  Copy for chart, and copy for patient signed, and dated. Patient/caregiver able to verbalize understanding.  Discharge Medication: Allergies as of 12/07/2019      Reactions   Morphine    Affected pts heart    Sulfonamide Derivatives Rash   blistering      Medication List    TAKE these medications   acetaminophen 500 MG tablet Commonly known as: TYLENOL Take 500 mg by mouth every 8 (eight) hours as needed for moderate pain or headache.   ALPRAZolam 1 MG tablet Commonly known as: XANAX Take 1 mg by mouth at bedtime. *May take one additional tablet daily as needed   atorvastatin 20 MG tablet Commonly known as: LIPITOR Take 20 mg by mouth daily.   buPROPion 100 MG 12 hr tablet Commonly known as: WELLBUTRIN SR Take 100 mg by mouth every morning.   diphenhydrAMINE 25 MG tablet Commonly known as: BENADRYL Take 25 mg by mouth daily as needed for allergies.   doxycycline 100 MG capsule Commonly known as: VIBRAMYCIN Take 1 capsule (100 mg total) by mouth 2 (two) times daily.   Eliquis 5 MG Tabs tablet Generic drug: apixaban TAKE 1 TABLET BY MOUTH TWICE DAILY What changed: how much to take   gabapentin 600 MG tablet Commonly known as: NEURONTIN Take 600 mg by mouth 3 (three) times daily.   glimepiride 2 MG tablet Commonly known as: AMARYL Take 2 mg by mouth daily.   levothyroxine 125 MCG tablet Commonly known as: SYNTHROID Take 125 mcg by mouth every morning.   metFORMIN 500 MG tablet Commonly known as: GLUCOPHAGE Take 1-2 tablets (500-1,000 mg total) by mouth See admin instructions. Take 1000 mg in the morning and 500 mg in the evening   nitroGLYCERIN 0.4 MG SL tablet Commonly known as: NITROSTAT Place 0.4 mg under the tongue every 5 (five) minutes as needed for chest pain.   omega-3 acid ethyl esters 1 g capsule Commonly  known as: LOVAZA Take 2 capsules by mouth 2 (two) times daily.   omeprazole 40 MG capsule Commonly known as: PRILOSEC Take 40 mg by mouth in the morning.   oxyCODONE-acetaminophen 10-325 MG tablet Commonly known as: PERCOCET Take 1 tablet by mouth 5 (five) times daily.       Discharge Assessment: Vitals:   12/07/19 0850 12/07/19 1300  BP:  140/79  Pulse:  69  Resp:  18  Temp:  98.6 F (37 C)  SpO2: 97% 96%   Skin clean, dry and intact without evidence of skin break down, no evidence of skin tears noted. IV catheter discontinued intact. Site without signs and symptoms of complications - no redness or edema noted at insertion site, patient denies c/o pain - only slight tenderness at site.  Dressing with slight pressure applied.  D/c Instructions-Education: Discharge instructions given to patient/family with verbalized understanding. D/c education completed with patient/family including follow up instructions, medication list, d/c activities limitations if indicated, with other d/c instructions as indicated by MD - patient able to verbalize understanding, all questions fully answered. Patient instructed to return to ED, call 911, or call MD for any changes in condition.  Patient escorted via WC, and D/C home via private auto.  Lonn Georgia, RN 12/07/2019 3:47 PM

## 2019-12-10 DIAGNOSIS — I10 Essential (primary) hypertension: Secondary | ICD-10-CM | POA: Diagnosis not present

## 2019-12-10 DIAGNOSIS — G9009 Other idiopathic peripheral autonomic neuropathy: Secondary | ICD-10-CM | POA: Diagnosis not present

## 2019-12-10 DIAGNOSIS — Z0001 Encounter for general adult medical examination with abnormal findings: Secondary | ICD-10-CM | POA: Diagnosis not present

## 2019-12-10 DIAGNOSIS — E782 Mixed hyperlipidemia: Secondary | ICD-10-CM | POA: Diagnosis not present

## 2019-12-10 DIAGNOSIS — E119 Type 2 diabetes mellitus without complications: Secondary | ICD-10-CM | POA: Diagnosis not present

## 2019-12-11 ENCOUNTER — Other Ambulatory Visit: Payer: Self-pay | Admitting: *Deleted

## 2019-12-11 ENCOUNTER — Encounter: Payer: Self-pay | Admitting: *Deleted

## 2019-12-11 DIAGNOSIS — E1140 Type 2 diabetes mellitus with diabetic neuropathy, unspecified: Secondary | ICD-10-CM | POA: Diagnosis not present

## 2019-12-11 DIAGNOSIS — E1122 Type 2 diabetes mellitus with diabetic chronic kidney disease: Secondary | ICD-10-CM | POA: Diagnosis not present

## 2019-12-11 DIAGNOSIS — E1142 Type 2 diabetes mellitus with diabetic polyneuropathy: Secondary | ICD-10-CM | POA: Diagnosis not present

## 2019-12-11 DIAGNOSIS — E119 Type 2 diabetes mellitus without complications: Secondary | ICD-10-CM | POA: Diagnosis not present

## 2019-12-11 DIAGNOSIS — E039 Hypothyroidism, unspecified: Secondary | ICD-10-CM | POA: Diagnosis not present

## 2019-12-11 NOTE — Patient Outreach (Signed)
[facility] Customer service manager [name]) Care Management [facility] CM Telephone Outreach, EMMI Red-Alert notification/ General Discharge PCP completes Transition of Care outreach post-Armstrong discharge Post-Armstrong discharge day # 4  12/11/2019  [name] 01/28/1946 [id number]  EMMI Red-Alert notification/ General Discharge EMMI Call date/ day #: Sunday December 09, 2019; day # 1 Red-Alert reason(s): "Dont know" if received Armstrong discharge papers  Successful telephone outreach to [name], 74 y/o male referred December 10, 2019 to [facility] RN CM by [name] CMA after EMMI Red-Alert notification, as above.  Patient had recent hospitalization April 13-16, 2021 for (L) leg abscess/ MRSA presumably from spider bite; patient experienced acute pain, swelling, erythema and required emergent I/D of abscess while hospitalized.  Patient was discharged home to self-care without home health services in place.  Patient has history including, but not limited to, HTN/ HLD; paroxsymal A-Fib with previous CVA; CAD with pacemaker; DM- type II; CKD- stage III; anxiety/ depression.   HIPAA/ identity verified with patient and purpose of call/ [name] CM services were discussed with patient who provides verbal consent to complete EMMI screening call; patient declines ongoing [facility] RN CM follow up, stating no care management/ care coordination needs; states that he is normally in very good health and manages all aspects of his health independently.  EMMI red screening call was completed and no care management/ coordination or disease management needs were identified as a result of screening process.  Patient reports:  -- has and has reviewed Armstrong discharge papers; denies questions around post-Armstrong discharge instructions -- accurately and independently verbalizes upcoming scheduled provider appointments with plans to attend as scheduled: ---- post surgical provider Armstrong visit tomorrow morning; PCP appointment on  Monday 12/17/19  -- lives with adult daughter and her family; reports supportive family members that can assist in care needs if indicated, but describes himeself as independent in ADL's and iADL's; patient also adds that he has three living local adult sisters that are RN's and assist with care needs if indicated; also reports that he does not live with his wife, but that he and she are "still friends" and wife assists with care needs if indicated ---- SDOH completed for: depression/ transportation/ food insecurity: no concerns identified -- no concerns/ questions/ problems around medications; reports has all and is taking as prescribed; self-manages medications, uses pill box; declines full medication review today, however does confirm that he obtained and is taking post-Armstrong discharge antibiotics as prescribed -- no concerns around wound; reports soreness at site, and complains of chronic back/ leg pain; states prescribed medications help -- no new/ recent falls, however, reports several falls over course of this year, all without injury; states falls are due to "legs giving way" due to chronic pain as described above; states he has not fallen in a couple of months, and is "always" able to get himself up if he falls; states he is "never at home alone," and would have assistance from family should he fall and need assistance getting up.  Currently uses cane for ambulation, states he has both walker and wheelchair if needed- but states he finds use of cane "best for" him.  Fall risks/ prevention education was provided and discussed with patient. -- Advanced Directives:   Reports has HCPOA and Living will and declines desire to make changes; endorses self as full code, "unless I have to live on tubes"  Patient denies further issues, concerns, or problems today.  I provided/ confirmed that patient has my direct phone number, the main  [name] phone number, and the [name] CM 24-hour nurse advice phone  number should he change his mind and wish to participate in [facility] CM program in the future.  Plan:  Will make patient inactive with [facility] CM, as no care management/ care coordination needs were identified during EMMI screening call today, and patient has declined ongoing [facility] CM follow up; will make patient's PCP aware of same  Oneta Rack, RN, BSN, [place] Coordinator [facility] Care Management  [phone]

## 2019-12-12 ENCOUNTER — Encounter: Payer: Self-pay | Admitting: Cardiothoracic Surgery

## 2019-12-12 ENCOUNTER — Ambulatory Visit (INDEPENDENT_AMBULATORY_CARE_PROVIDER_SITE_OTHER): Payer: Medicare Other | Admitting: Cardiothoracic Surgery

## 2019-12-12 ENCOUNTER — Other Ambulatory Visit: Payer: Self-pay

## 2019-12-12 VITALS — BP 162/73 | HR 72 | Temp 97.5°F | Resp 12 | Ht 69.0 in | Wt 188.0 lb

## 2019-12-12 DIAGNOSIS — L089 Local infection of the skin and subcutaneous tissue, unspecified: Secondary | ICD-10-CM

## 2019-12-12 NOTE — Progress Notes (Signed)
Wound Care: New Patient  Left Medial Thigh: Measurements: 2.1x3.5x1.7 Wound Bed Description: 75% yellow/tan/black and 25% pink, small amount of serosanguinous drainage. Induration to periwound, site blanchable.  Treatment: Site cleansed with anacept and gauze, MD debrided site. Applied skin prep to periwound, Iodosorb to wound bed, and covered with gauze/ABD, secured with tape.   Dressing to be changed by wife daily, follow-up appointment in 1 week.  Patient ID: [name], male   DOB: Aug 01, 1946, 74 y.o.   MRN: [id number]  Chief Complaint  Patient presents with  . New Patient (Initial Visit)    spider bite    Referred By Dr. Blake Divine Reason for Referral possible spider bite left medial thigh  HPI Location, Quality, Duration, Severity, Timing, Context, Modifying Factors, Associated Signs and Symptoms.  [name] is a 74 y.o. male. His past medical history includes diabetes mellitus with hyperlipidemia and prior history of coronary disease and carotid artery disease. Approximately 2 weeks ago he was working out in the field when he felt a small pinch on the medial aspect of his left thigh. Later that day he noticed that he had a small area that he thought was a spider bite. This began as a small little white raised area which is wife then lanced with a needle. Over the next several days it became more indurated and ultimately he presented to his primary care physician. They transferred him to the emergency department at [facility] where he was admitted and placed on intravenous antibiotics. He is currently on doxycycline. He underwent incision and drainage of this area. Since he has been home his wife is been using the wound with alcohol and applying topical Neosporin.  He has been checking his blood sugars.  He states that in the mornings they have have run as high as 170 but throughout the day they are running in in the 70-80 range.  He denied any fevers.  He denied  any significant purulent drainage from the wound since his discharge.   Past Medical History:  Diagnosis Date  . Alcohol use   . Anxiety   . Chest pain   . Coronary artery disease    a. Nonobstructive CAD by cath 2010 with negative nuclear stress test 05/2013.  . Depression   . Diabetes mellitus   . Hyperlipidemia   . Hypertension   . Hypothyroidism   . Kidney problem    a. 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  . Mild aortic insufficiency   . PAT (paroxysmal atrial tachycardia) ([place])   . Pericardial effusion    a. by echo 2014.  . Pulmonary nodules    resolved  . PVC's (premature ventricular contractions)   . Stroke (cerebrum) ([place])   . Stroke due to embolism of left carotid artery ([place]) 03/24/2016   Left caudate head stroke  . Wide-complex tachycardia ([place])    a. per [name] note: Event monitor September 2014 sinus rhythm, occasional PVCs, nonsustained PAT, and one 7 beat run of a wide QRS complex at 110-120 bpm, with minimal changes QRS axis, and QRS morphology not similar to documented PVCs.     Past Surgical History:  Procedure Laterality Date  . BACK SURGERY  1996  . BREAST LUMPECTOMY Left 1979  . CIRCUMCISION  1980  . MANDIBLE SURGERY Right 1969  . PACEMAKER IMPLANT N/A 06/26/2019   Procedure: PACEMAKER IMPLANT;  Surgeon: Thompson Grayer, MD;  Location: [facility];  Service: Cardiovascular;  Laterality: N/A;    Family  History  Problem Relation Age of Onset  . Lung cancer Mother   . Diabetes Father   . Bladder Cancer Father   . Colon cancer Brother   . Stroke Maternal Grandmother   . Cancer Maternal Grandfather   . Stroke Paternal Grandmother   . Stroke Paternal Grandfather     Social History Social History   Tobacco Use  . Smoking status: Former Smoker    Quit date: 1984    Years since quitting: 37.3  . Smokeless tobacco: Never Used  Substance Use Topics  . Alcohol use: Not Currently  . Drug use: No    Allergies  Allergen Reactions  .  Morphine     Affected pts heart   . Sulfonamide Derivatives Rash    blistering    Current Outpatient Medications  Medication Sig Dispense Refill  . acetaminophen (TYLENOL) 500 MG tablet Take 500 mg by mouth every 8 (eight) hours as needed for moderate pain or headache.    . ALPRAZolam (XANAX) 1 MG tablet Take 1 mg by mouth at bedtime. *May take one additional tablet daily as needed  2  . atorvastatin (LIPITOR) 20 MG tablet Take 20 mg by mouth daily.     Marland Kitchen buPROPion (WELLBUTRIN SR) 100 MG 12 hr tablet Take 100 mg by mouth every morning.    . diphenhydrAMINE (BENADRYL) 25 MG tablet Take 25 mg by mouth daily as needed for allergies.    Marland Kitchen doxycycline (VIBRAMYCIN) 100 MG capsule Take 1 capsule (100 mg total) by mouth 2 (two) times daily. 20 capsule 0  . ELIQUIS 5 MG TABS tablet TAKE 1 TABLET BY MOUTH TWICE DAILY (Patient taking differently: Take 5 mg by mouth 2 (two) times daily. ) 60 tablet 6  . gabapentin (NEURONTIN) 600 MG tablet Take 600 mg by mouth 3 (three) times daily.   1  . glimepiride (AMARYL) 2 MG tablet Take 2 mg by mouth daily.     Marland Kitchen levothyroxine (SYNTHROID) 125 MCG tablet Take 125 mcg by mouth every morning.    . metFORMIN (GLUCOPHAGE) 500 MG tablet Take 1-2 tablets (500-1,000 mg total) by mouth See admin instructions. Take 1000 mg in the morning and 500 mg in the evening    . nitroGLYCERIN (NITROSTAT) 0.4 MG SL tablet Place 0.4 mg under the tongue every 5 (five) minutes as needed for chest pain.     Marland Kitchen omega-3 acid ethyl esters (LOVAZA) 1 g capsule Take 2 capsules by mouth 2 (two) times daily.  12  . omeprazole (PRILOSEC) 40 MG capsule Take 40 mg by mouth in the morning.     Marland Kitchen oxyCODONE-acetaminophen (PERCOCET) 10-325 MG tablet Take 1 tablet by mouth 5 (five) times daily.   0   No current facility-administered medications for this visit.      Review of Systems A complete review of systems was asked and was negative except for the following positive findings  Blood pressure  (!) 162/73, pulse 72, temperature (!) 97.5 F (36.4 C), temperature source Oral, resp. rate 12, height 5\' 9"  (1.753 m), weight 188 lb (85.3 kg), SpO2 94 %.  Physical Exam   On physical exam he is a well-developed well-nourished gentleman in no acute distress.  On the medial aspect of his left leg there is a linear incision with some eschar on both the superior and inferior limbs of the wound.  There is some necrotic debris at the base.  There is a small amount of induration superiorly.  There is no erythema.  There is no significant purulent discharge.    Assessment    Medial left thigh wound    Plan    After reviewing with the patient the indications and risks of surgical debridement we then applied topical lidocaine.  After waiting approximately 15 minutes sharp debridement of the wound was performed.  At the completion of the procedure the wound was hemostatic.  We then used Iodosorb and a gauze.  He was instructed to remove the dressing once per day.  He should then shower allowing water to irrigate the wound.  They should then reapply the Iodosorb dressing.  We will see him back again in 1 week.       Hulda Marin, MD 12/12/2019, 10:04 AM

## 2019-12-17 DIAGNOSIS — E1122 Type 2 diabetes mellitus with diabetic chronic kidney disease: Secondary | ICD-10-CM | POA: Diagnosis not present

## 2019-12-17 DIAGNOSIS — E782 Mixed hyperlipidemia: Secondary | ICD-10-CM | POA: Diagnosis not present

## 2019-12-17 DIAGNOSIS — I129 Hypertensive chronic kidney disease with stage 1 through stage 4 chronic kidney disease, or unspecified chronic kidney disease: Secondary | ICD-10-CM | POA: Diagnosis not present

## 2019-12-17 DIAGNOSIS — E1142 Type 2 diabetes mellitus with diabetic polyneuropathy: Secondary | ICD-10-CM | POA: Diagnosis not present

## 2019-12-19 ENCOUNTER — Ambulatory Visit (INDEPENDENT_AMBULATORY_CARE_PROVIDER_SITE_OTHER): Payer: Medicare Other | Admitting: Cardiothoracic Surgery

## 2019-12-19 ENCOUNTER — Other Ambulatory Visit: Payer: Self-pay

## 2019-12-19 ENCOUNTER — Encounter: Payer: Self-pay | Admitting: Cardiothoracic Surgery

## 2019-12-19 VITALS — BP 153/72 | HR 67 | Temp 97.6°F | Resp 14 | Ht 69.0 in | Wt 184.0 lb

## 2019-12-19 DIAGNOSIS — L089 Local infection of the skin and subcutaneous tissue, unspecified: Secondary | ICD-10-CM | POA: Diagnosis not present

## 2019-12-19 NOTE — Progress Notes (Signed)
[facility]patient Post-Op Note  Patient ID: JUNIOR HUEZO, male   DOB: Apr 28, 1946, 74 y.o.   MRN: [id number]  HISTORY: He returns today in follow-up.  He has been changing his dressing daily.  He has been using Iodosorb on it.  He has been getting in the shower each day and washing over the area.  He states that he has noticed that it has been less red and less hard around the wound.  He has had no fevers.  He just completed his last doxycycline antibiotic therapy.    Vitals:   12/19/19 0919  BP: (!) 153/72  Pulse: 67  Resp: 14  Temp: 97.6 F (36.4 C)  SpO2: 97%     EXAM: On exam today the wound is less red with less induration.  There is some necrotic debris around the base of the wound and also around the edges.  Topical lidocaine was applied and I then debrided the wound in its entirety.  The eschar was removed around the edges of the wound back to punctate bleeding.  The base of the wound was also debrided.  There is a moderate amount of biofilm present with some gray tissue at the base.               ASSESSMENT: Open wound left medial thigh   PLAN:   We will change the dressing today.  We are going to use Hydrofera Blue on the wound.  He will then cover this with a silicone border dressing.    Hulda Marin, MDFollow-Up Wound Care  Left Medial Thigh: Measurements: 2.2x3.3x1.5 Wound Bed Description: 30% black/yellow and 70% dusky red. Slight induration to periwound with redness (area is blanchable). Small amount of serosanguinous drainage. Treatment: Cleansed site with anacept and gauze, skin prep to periwound, MD changed treatment to [facility] Blue-Ready, and covered with adhesive foam. To be changed every other day. Wife watched dressing change and participated, stated full understanding.  Follow-up appointment in one week.

## 2019-12-26 ENCOUNTER — Encounter: Payer: Self-pay | Admitting: Cardiothoracic Surgery

## 2019-12-26 ENCOUNTER — Ambulatory Visit (INDEPENDENT_AMBULATORY_CARE_PROVIDER_SITE_OTHER): Payer: Medicare Other | Admitting: Cardiothoracic Surgery

## 2019-12-26 ENCOUNTER — Telehealth: Payer: Self-pay

## 2019-12-26 ENCOUNTER — Other Ambulatory Visit: Payer: Self-pay

## 2019-12-26 VITALS — BP 175/67 | HR 62 | Temp 97.7°F | Resp 12 | Ht 69.0 in | Wt 187.0 lb

## 2019-12-26 DIAGNOSIS — L089 Local infection of the skin and subcutaneous tissue, unspecified: Secondary | ICD-10-CM

## 2019-12-26 NOTE — Progress Notes (Signed)
Follow-Up Wound care  Left Medial Thigh Measurements: 2.2x3.3x1.5, smaller in length, width, and depth. Wound Bed Description: 25% yellow and 75% red/pink, wound bed is cleanser with less necrotic noted. MD debrided site. Treatment: Cleansed with anacept and gauze, applied skin prep to periwound, Hydrofera Blue-Ready to wound bed, and covered with adhesive foam. To be changed every other day (wife to change).  Follow-Up Appointment in one week.   [facility] Follow Up Note  Patient ID: [name], male   DOB: 10-18-1945, 74 y.o.   MRN: [id number]  HISTORY: He returns today in follow-up.  He has been getting in the shower daily and washing over the wound with soap and water.  He has been changing the dressing with Hydrofera Blue.  He has no new complaints today.    Vitals:   12/26/19 0931  BP: (!) 175/67  Pulse: 62  Resp: 12  Temp: 97.7 F (36.5 C)  SpO2: 95%     EXAM:  On exam the wound is as measured above.  It certainly has much more granulation tissue.  There was some biofilm present around the edges which I sharply debrided.  There is no surrounding erythema or discharge from the wound itself.  There is no warmth.     ASSESSMENT: Status post insect bite left medial thigh   PLAN:   We will continue the Hydrofera Blue.  I did tell him that he could use a very soft sponge on the wound.  He should avoid any rough and abrasive washcloths.  He will continue the daily dressing changes and we will see him back again in 1 week    Hulda Marin, MD

## 2019-12-26 NOTE — Telephone Encounter (Signed)
Left message for patient to remind of missed remote transmission.  

## 2019-12-28 ENCOUNTER — Ambulatory Visit (INDEPENDENT_AMBULATORY_CARE_PROVIDER_SITE_OTHER): Payer: Medicare Other | Admitting: Internal Medicine

## 2019-12-28 ENCOUNTER — Other Ambulatory Visit: Payer: Self-pay

## 2019-12-28 ENCOUNTER — Encounter: Payer: Self-pay | Admitting: Internal Medicine

## 2019-12-28 VITALS — BP 128/76 | HR 54 | Ht 69.0 in | Wt 189.0 lb

## 2019-12-28 DIAGNOSIS — I480 Paroxysmal atrial fibrillation: Secondary | ICD-10-CM

## 2019-12-28 DIAGNOSIS — R0789 Other chest pain: Secondary | ICD-10-CM | POA: Diagnosis not present

## 2019-12-28 DIAGNOSIS — I495 Sick sinus syndrome: Secondary | ICD-10-CM

## 2019-12-28 DIAGNOSIS — R001 Bradycardia, unspecified: Secondary | ICD-10-CM

## 2019-12-28 LAB — CUP PACEART INCLINIC DEVICE CHECK
Battery Remaining Longevity: 145 mo
Battery Voltage: 3.01 V
Brady Statistic RA Percent Paced: 2 %
Brady Statistic RV Percent Paced: 0.84 %
Date Time Interrogation Session: 20210507095958
Implantable Lead Implant Date: 20201103
Implantable Lead Implant Date: 20201103
Implantable Lead Location: 753859
Implantable Lead Location: 753860
Implantable Pulse Generator Implant Date: 20201103
Lead Channel Impedance Value: 412.5 Ohm
Lead Channel Impedance Value: 512.5 Ohm
Lead Channel Pacing Threshold Amplitude: 0.5 V
Lead Channel Pacing Threshold Amplitude: 0.5 V
Lead Channel Pacing Threshold Amplitude: 0.75 V
Lead Channel Pacing Threshold Amplitude: 0.75 V
Lead Channel Pacing Threshold Pulse Width: 0.5 ms
Lead Channel Pacing Threshold Pulse Width: 0.5 ms
Lead Channel Pacing Threshold Pulse Width: 0.5 ms
Lead Channel Pacing Threshold Pulse Width: 0.5 ms
Lead Channel Sensing Intrinsic Amplitude: 1.8 mV
Lead Channel Sensing Intrinsic Amplitude: 12 mV
Lead Channel Setting Pacing Amplitude: 2 V
Lead Channel Setting Pacing Amplitude: 2.5 V
Lead Channel Setting Pacing Pulse Width: 0.5 ms
Lead Channel Setting Sensing Sensitivity: 2 mV
Pulse Gen Model: 2272
Pulse Gen Serial Number: 9174806

## 2019-12-28 NOTE — Progress Notes (Signed)
PCP: Benita Stabile, MD Primary Cardiologist: Dr Wyline Mood Primary EP:  Dr Johney Frame  [name] is a 74 y.o. male who presents today for routine electrophysiology followup.  He has done well s/p PPM implant. He has occasional chest pain at rest.  He feels that this is at times due to stress. Improved with NTG.  Continues to have falls due to back pain.  Denies LOC.  Today, he denies symptoms of palpitations, exertional chest pain, shortness of breath,  lower extremity edema, dizziness, presyncope, or syncope.  The patient is otherwise without complaint today.   Past Medical History:  Diagnosis Date  . Alcohol use   . Anxiety   . Chest pain   . Coronary artery disease    a. Nonobstructive CAD by cath 2010 with negative nuclear stress test 05/2013.  . Depression   . Diabetes mellitus   . Hyperlipidemia   . Hypertension   . Hypothyroidism   . Kidney problem    a. 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  . Mild aortic insufficiency   . PAT (paroxysmal atrial tachycardia) ([facility])   . Pericardial effusion    a. by echo 2014.  . Pulmonary nodules    resolved  . PVC's (premature ventricular contractions)   . Stroke (cerebrum) ([facility])   . 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 Wide-complex tachycardia ([facility])    a. per [name] note: Event monitor September 2014 sinus rhythm, occasional PVCs, nonsustained PAT, and one 7 beat run of a wide QRS complex at 110-120 bpm, with minimal changes QRS axis, and QRS morphology not similar to documented PVCs.    Past Surgical History:  Procedure Laterality Date  . BACK SURGERY  1996  . BREAST LUMPECTOMY Left 1979  . CIRCUMCISION  1980  . MANDIBLE SURGERY Right 1969  . PACEMAKER IMPLANT N/A 06/26/2019   Procedure: PACEMAKER IMPLANT;  Surgeon: Hillis Range, MD;  [facility]: [name];  Service: Cardiovascular;  Laterality: N/A;    ROS- all systems are reviewed and negative except as per  HPI above  Current Outpatient Medications  Medication Sig Dispense Refill  . acetaminophen (TYLENOL) 500 MG tablet Take 500 mg by mouth every 8 (eight) hours as needed for moderate pain or headache.    . ALPRAZolam (XANAX) 1 MG tablet Take 1 mg by mouth at bedtime. *May take one additional tablet daily as needed  2  . atorvastatin (LIPITOR) 20 MG tablet Take 20 mg by mouth daily.     Marland Kitchen buPROPion (WELLBUTRIN SR) 100 MG 12 hr tablet Take 100 mg by mouth every morning.    . diphenhydrAMINE (BENADRYL) 25 MG tablet Take 25 mg by mouth daily as needed for allergies.    Marland Kitchen ELIQUIS 5 MG TABS tablet TAKE 1 TABLET BY MOUTH TWICE DAILY (Patient taking differently: Take 5 mg by mouth 2 (two) times daily. ) 60 tablet 6  . gabapentin (NEURONTIN) 600 MG tablet Take 600 mg by mouth 3 (three) times daily.   1  . glimepiride (AMARYL) 2 MG tablet Take 2 mg by mouth daily.     Marland Kitchen levothyroxine (SYNTHROID) 125 MCG tablet Take 125 mcg by mouth every morning.    . metFORMIN (GLUCOPHAGE) 500 MG tablet Take 1-2 tablets (500-1,000 mg total) by mouth See admin instructions. Take 1000 mg in the morning and 500 mg in the evening    . nitroGLYCERIN (NITROSTAT) 0.4 MG SL  tablet Place 0.4 mg under the tongue every 5 (five) minutes as needed for chest pain.     Marland Kitchen omega-3 acid ethyl esters (LOVAZA) 1 g capsule Take 2 capsules by mouth 2 (two) times daily.  12  . omeprazole (PRILOSEC) 40 MG capsule Take 40 mg by mouth in the morning.     Marland Kitchen oxyCODONE-acetaminophen (PERCOCET) 10-325 MG tablet Take 1 tablet by mouth 5 (five) times daily.   0   No current facility-administered medications for this visit.    Physical Exam: Vitals:   12/28/19 0946  BP: 128/76  Pulse: (!) 54  SpO2: 95%  Weight: 189 lb (85.7 kg)  Height: 5\' 9"  (1.753 m)    GEN- The patient is elderly and frail appearing, alert and oriented x 3 today.   Head- normocephalic, atraumatic Eyes-  Sclera clear, conjunctiva pink Ears- hearing intact Oropharynx-  clear Lungs-  normal work of breathing Chest- pacemaker pocket is well healed Heart- Regular rate and rhythm  GI- soft, NT, ND, + BS Extremities- no clubbing, cyanosis, or edema Walks with two canes today  Pacemaker interrogation- reviewed in detail today,  See PACEART report  ekg tracing ordered today is personally reviewed and shows sinus with PR 212 msec, LAHB  Assessment and Plan:  1. Symptomatic sinus bradycardia with post termination pauses Normal pacemaker function See Pace Art report Outputs changed to chronic settings today he is not device dependant today  2. Paroxysmal atrial fibrillation afib burden is 6.2 % He is on eliquis for stroke prevention Minimal symptoms We will need to be careful with [place] given falls  3. Chest pain Chronic/ stable Followed by Dr Harl Bowie  Return to see me in a year  Thompson Grayer MD, [facility] 12/28/2019 10:01 AM

## 2019-12-28 NOTE — Addendum Note (Signed)
Addended by: Eustace Moore on: 12/28/2019 03:20 PM   Modules accepted: Orders

## 2019-12-28 NOTE — Patient Instructions (Signed)

## 2020-01-02 ENCOUNTER — Encounter: Payer: Self-pay | Admitting: Cardiothoracic Surgery

## 2020-01-02 ENCOUNTER — Ambulatory Visit: Payer: Self-pay | Admitting: Cardiothoracic Surgery

## 2020-01-02 ENCOUNTER — Other Ambulatory Visit: Payer: Self-pay

## 2020-01-02 ENCOUNTER — Ambulatory Visit (INDEPENDENT_AMBULATORY_CARE_PROVIDER_SITE_OTHER): Payer: Medicare Other | Admitting: Cardiothoracic Surgery

## 2020-01-02 VITALS — BP 153/82 | HR 74 | Temp 97.4°F | Resp 18 | Ht 69.0 in | Wt 188.6 lb

## 2020-01-02 DIAGNOSIS — L089 Local infection of the skin and subcutaneous tissue, unspecified: Secondary | ICD-10-CM

## 2020-01-02 NOTE — Progress Notes (Signed)
Wound Care Follow-Up  Left Medial Thigh: Measurements: 1.7x2.5x0.1, improvements noted in measurements.  Wound bed Description: 15% yellow/white and 85% pink/red with scattered areas of maroon. Small amount of serosanguinous drainage.  Treatment: Cleaned with anacept and gauze, applied skin prep to periwound, hydrofera blue-ready to wound bed and covered with adhesive foam. To be changed every other day.  Follow-Up Appointment in one week.  The patient returns today in follow-up of his left medial thigh wound.  He has been using Hydrofera Blue on it daily.  He has been getting in the shower and washing over the wound with light debridement.  He has no new complaints.  The wound itself is measuring smaller.  It has an excellent granulating base.  There is no induration.  There is no discharge from the wound.  There is no surrounding erythema.  We will continue the daily Hydrofera Blue dressing changes with a soft bordered foam dressing.  He will come back to see [place] again in 1 week.

## 2020-01-09 ENCOUNTER — Ambulatory Visit (INDEPENDENT_AMBULATORY_CARE_PROVIDER_SITE_OTHER): Payer: Medicare Other | Admitting: Cardiothoracic Surgery

## 2020-01-09 ENCOUNTER — Other Ambulatory Visit: Payer: Self-pay

## 2020-01-09 VITALS — BP 147/71 | HR 74 | Temp 97.4°F | Resp 18 | Ht 69.0 in | Wt 187.8 lb

## 2020-01-09 DIAGNOSIS — L089 Local infection of the skin and subcutaneous tissue, unspecified: Secondary | ICD-10-CM

## 2020-01-09 NOTE — Progress Notes (Signed)
Wound care Follow-Up  Left Medial Thigh:  Measurements: 0.9x1.1x0.1 (smaller in length and width) Wound bed description: 100% red with small amount of serosanguinous drainage. Treatment: Cleasned with anacept and gauze, skin prep to periwound, Hydrofera Blue-Ready to wound bed and covered with adhesive foam.   Follow-Up Appointment in 1 week,   Patient ID: [name], male   DOB: 1945/09/16, 74 y.o.   MRN: [id number]     Overall he seems to be doing quite well.  He had no new issues.  He has been changing the dressing daily and has had no problems with that.  There is been a small amount of bleeding with the dressing changes  The wound measurements are significantly smaller in length and width.  The is a very clean granulating base.  There is no erythema or induration.  There is no discharge from the wound.  We will continue the Hydrofera Blue dressings.  He will change those daily as he gets in and out of the shower.  We will see him back again in 1 week.

## 2020-01-16 ENCOUNTER — Encounter: Payer: Self-pay | Admitting: Cardiothoracic Surgery

## 2020-01-16 ENCOUNTER — Ambulatory Visit (INDEPENDENT_AMBULATORY_CARE_PROVIDER_SITE_OTHER): Payer: Medicare Other | Admitting: Cardiothoracic Surgery

## 2020-01-16 ENCOUNTER — Other Ambulatory Visit: Payer: Self-pay

## 2020-01-16 VITALS — BP 163/75 | HR 64 | Temp 97.9°F | Ht 69.0 in | Wt 189.4 lb

## 2020-01-16 DIAGNOSIS — L089 Local infection of the skin and subcutaneous tissue, unspecified: Secondary | ICD-10-CM | POA: Diagnosis not present

## 2020-01-16 NOTE — Progress Notes (Signed)
Wound Care Follow-Up   Wound to left medial thigh noted to be healed. Patient will call if wound re-opens or new site develops. Instructed to keep covered to protect site for at least two weeks, due to newly epithelialized tissue. Patient and his wife have no questions or concerns at this time.   [facility] Follow Up Note  Patient ID: [name], male   DOB: 1946-08-20, 74 y.o.   MRN: [id number]  HISTORY: No new problems.  He states he has been performing his wound care without incident.  The wound now is completely closed although there is some thin skin in the central portion    Vitals:   01/16/20 0902  BP: (!) 163/75  Pulse: 64  Temp: 97.9 F (36.6 C)  SpO2: 94%     EXAM:  The wound is now closed.  There is some discolored skin at the central portion but there is no open areas.  There is no induration or erythema.   ASSESSMENT: Open wound left thigh (closed)   PLAN:   We instructed him to be gentle with the wound care and wound cleansing.  He understands will contact us if there is any further issues.  Otherwise he was discharged from our care today.    Hulda Marin, MD

## 2020-01-29 ENCOUNTER — Other Ambulatory Visit: Payer: Self-pay

## 2020-01-29 ENCOUNTER — Encounter: Payer: Self-pay | Admitting: Cardiology

## 2020-01-29 ENCOUNTER — Ambulatory Visit (INDEPENDENT_AMBULATORY_CARE_PROVIDER_SITE_OTHER): Payer: Medicare Other | Admitting: Cardiology

## 2020-01-29 VITALS — BP 146/78 | HR 65 | Ht 69.0 in | Wt 186.8 lb

## 2020-01-29 DIAGNOSIS — R42 Dizziness and giddiness: Secondary | ICD-10-CM | POA: Diagnosis not present

## 2020-01-29 DIAGNOSIS — I480 Paroxysmal atrial fibrillation: Secondary | ICD-10-CM

## 2020-01-29 DIAGNOSIS — R001 Bradycardia, unspecified: Secondary | ICD-10-CM

## 2020-01-29 NOTE — Progress Notes (Signed)
Clinical Summary [name] is a 74 y.o.male seen today for follow up of the following medical problems.   1. PAF - noted during 10/2018 admission - had not been on anticoag due to frequent falls previously.   - since pacemaker placement falls have resolved, started on eliquis 5mg  bid.   - no recent palpitaitons - compliant withmeds  2. Bradycardia - chronicaccording ot notes. 10/2018 admissoin had some rates to 40s and 50s - heart monitor showed bradycardia and post termination pauses up to 3.2 seconds  06/26/19 pacemaker [employer] Jude medical assurity dual chamber placed - followed by Dr Rayann Heman  12/2019 appt with normal device check.   3.Chest pain - extensive prior workup as reported below, including cath in 2010 and stress test 2014 - admit 10/2018 with negative enzymes and echo  -denies any recent symptoms  4. Orthostatic dizziness - rare symptoms. Improved since stopping lisinoopril     Prior cardiology notes  Prevoiusly followed by [facility] cardiology. From notes had normal cardiac monitor 2016.echocardiogram October 2014 ejection fraction 50-55%, G1DD, mild left atrial margin, 1+ AR, 1+ TR, no significant pericardial effusion -Echocardiogram interpreted by Dr. Alyse Low [facility] office), September 2014, preserved ejection fraction, small pericardial effusion, 1+ MR, 2+ AR  -Event monitor September 2014 sinus rhythm, occasional PVCs, nonsustained PAT, and one 7 beat run of a wide QRS complex at 110-120 bpm, with minimal changes QRS axis, and QRS morphology not similar to documented PVCs.  2. Heart catheterization 2010 nonobstructive coronary disease  -exercise Cardiolite stress test negative for ischemia with ejection fraction 61% October 2014    Past Medical History:  Diagnosis Date  . Alcohol use   . Anxiety   . Chest pain   . Coronary artery disease    a. Nonobstructive CAD by cath 2010 with negative nuclear stress test 05/2013.  . Depression   .  Diabetes mellitus   . Hyperlipidemia   . Hypertension   . Hypothyroidism   . Kidney problem    a. 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  . Mild aortic insufficiency   . PAT (paroxysmal atrial tachycardia) ([place])   . Pericardial effusion    a. by echo 2014.  . Pulmonary nodules    resolved  . PVC's (premature ventricular contractions)   . Stroke (cerebrum) ([place])   . Stroke due to embolism of left carotid artery ([place]) 03/24/2016   Left caudate head stroke  . Wide-complex tachycardia ([place])    a. per [name] note: Event monitor September 2014 sinus rhythm, occasional PVCs, nonsustained PAT, and one 7 beat run of a wide QRS complex at 110-120 bpm, with minimal changes QRS axis, and QRS morphology not similar to documented PVCs.      Allergies  Allergen Reactions  . Morphine     Affected pts heart   . Sulfonamide Derivatives Rash    blistering     Current Outpatient Medications  Medication Sig Dispense Refill  . acetaminophen (TYLENOL) 500 MG tablet Take 500 mg by mouth every 8 (eight) hours as needed for moderate pain or headache.    . ALPRAZolam (XANAX) 1 MG tablet Take 1 mg by mouth at bedtime. *May take one additional tablet daily as needed  2  . atorvastatin (LIPITOR) 20 MG tablet Take 20 mg by mouth daily.     Marland Kitchen buPROPion (WELLBUTRIN SR) 100 MG 12 hr tablet Take 100 mg by mouth every morning.    . diphenhydrAMINE (BENADRYL) 25 MG tablet Take 25 mg  by mouth daily as needed for allergies.    Marland Kitchen ELIQUIS 5 MG TABS tablet TAKE 1 TABLET BY MOUTH TWICE DAILY (Patient taking differently: Take 5 mg by mouth 2 (two) times daily. ) 60 tablet 6  . gabapentin (NEURONTIN) 600 MG tablet Take 600 mg by mouth 3 (three) times daily.   1  . glimepiride (AMARYL) 2 MG tablet Take 2 mg by mouth daily.     Marland Kitchen levothyroxine (SYNTHROID) 125 MCG tablet Take 125 mcg by mouth every morning.    Marland Kitchen lisinopril (ZESTRIL) 2.5 MG tablet Take 2.5 mg by mouth daily.    . metFORMIN (GLUCOPHAGE) 500 MG tablet Take  1-2 tablets (500-1,000 mg total) by mouth See admin instructions. Take 1000 mg in the morning and 500 mg in the evening    . nitroGLYCERIN (NITROSTAT) 0.4 MG SL tablet Place 0.4 mg under the tongue every 5 (five) minutes as needed for chest pain.     Marland Kitchen omega-3 acid ethyl esters (LOVAZA) 1 g capsule Take 2 capsules by mouth 2 (two) times daily.  12  . omeprazole (PRILOSEC) 40 MG capsule Take 40 mg by mouth in the morning.     Marland Kitchen oxyCODONE-acetaminophen (PERCOCET) 10-325 MG tablet Take 1 tablet by mouth 5 (five) times daily.   0   No current facility-administered medications for this visit.     Past Surgical History:  Procedure Laterality Date  . BACK SURGERY  1996  . BREAST LUMPECTOMY Left 1979  . CIRCUMCISION  1980  . MANDIBLE SURGERY Right 1969  . PACEMAKER IMPLANT N/A 06/26/2019   Procedure: PACEMAKER IMPLANT;  Surgeon: Hillis Range, MD;  Location: [name];  Service: Cardiovascular;  Laterality: N/A;     Allergies  Allergen Reactions  . Morphine     Affected pts heart   . Sulfonamide Derivatives Rash    blistering      Family History  Problem Relation Age of Onset  . Lung cancer Mother   . Diabetes Father   . Bladder Cancer Father   . Colon cancer Brother   . Stroke Maternal Grandmother   . Cancer Maternal Grandfather   . Stroke Paternal Grandmother   . Stroke Paternal Grandfather      Social History [name] reports that he quit smoking about 37 years ago. He has never used smokeless tobacco. [name] reports previous alcohol use.   Review of Systems CONSTITUTIONAL: No weight loss, fever, chills, weakness or fatigue.  HEENT: Eyes: No visual loss, blurred vision, double vision or yellow sclerae.No hearing loss, sneezing, congestion, runny nose or sore throat.  SKIN: No rash or itching.  CARDIOVASCULAR: per hpi RESPIRATORY: No shortness of breath, cough or sputum.  GASTROINTESTINAL: No anorexia, nausea, vomiting or diarrhea. No abdominal  pain or blood.  GENITOURINARY: No burning on urination, no polyuria NEUROLOGICAL: No headache, dizziness, syncope, paralysis, ataxia, numbness or tingling in the extremities. No change in bowel or bladder control.  MUSCULOSKELETAL: No muscle, back pain, joint pain or stiffness.  LYMPHATICS: No enlarged nodes. No history of splenectomy.  PSYCHIATRIC: No history of depression or anxiety.  ENDOCRINOLOGIC: No reports of sweating, cold or heat intolerance. No polyuria or polydipsia.  Marland Kitchen   Physical Examination Today's Vitals   01/29/20 1538  BP: (!) 146/78  Pulse: 65  SpO2: 93%  Weight: 186 lb 12.8 oz (84.7 kg)  Height: 5\' 9"  (1.753 m)   Body mass index is 27.59 kg/m.  Gen: resting comfortably, no acute distress HEENT: no  scleral icterus, pupils equal round and reactive, no palptable cervical adenopathy,  CV: RRR, no m/r/g, no jvd Resp: Clear to auscultation bilaterally GI: abdomen is soft, non-tender, non-distended, normal bowel sounds, no hepatosplenomegaly MSK: extremities are warm, no edema.  Skin: warm, no rash Neuro:  no focal deficits Psych: appropriate affect   Diagnostic Studies  01/2019 event monitor  7 day event monitor. Date is available from 83% of planned monitored time  Min HR 52, Max HR 111, Avg HR 65  Sinus rhythm with episodes of aflutter with variable conduction, no significant RVR. Can have postconversion pause up to 3.2 seconds.  No symptoms reported   10/2018 echo IMPRESSIONS    1. The left ventricle has normal systolic function with an ejection  fraction of 60-65%. The cavity size was normal. There is mild concentric  left ventricular hypertrophy. Left ventricular diastolic Doppler  parameters are consistent with impaired  relaxation. Indeterminate filling pressures No evidence of left  ventricular regional wall motion abnormalities.  2. The right ventricle has normal systolic function. The cavity was  normal. There is no increase in right  ventricular wall thickness.  3. The tricuspid valve is grossly normal.  4. The aortic valve is tricuspid Aortic valve regurgitation is mild by  color flow Doppler.  5. There is mild dilatation of the aortic root measuring 38 mm.  6. The interatrial septum was not well visualized.    04/2019 nuclear stress  There was no ST segment deviation noted during stress.  Findings consistent with prior inferior/inferoseptal/inferoapical myocardial infarction. No current ischemia  This is a low risk study.  The left ventricular ejection fraction is normal (55-65%).  Assessment and Plan  1. Bradycardia now s/p pacemaker - doing well, continue to monitor and follow in device clinic   2. PAF -no symptoms, continue current meds including anticoag   3. Orthostatic dizziness - improved since stopping lisinopril, tolerate high bp's for him.      F/u 6 months      Antoine Poche, M.D.

## 2020-01-29 NOTE — Patient Instructions (Addendum)
Medication Instructions:    Your physician recommends that you continue on your current medications as directed. Please refer to the Current Medication list given to you today.  Labwork:  NONE  Testing/Procedures:  NONE  Follow-Up:  Your physician recommends that you schedule a follow-up appointment in: 6 months (office)  Any Other Special Instructions Will Be Listed Below (If Applicable).  If you need a refill on your cardiac medications before your next appointment, please call your pharmacy. 

## 2020-02-04 ENCOUNTER — Telehealth: Payer: Self-pay

## 2020-02-04 DIAGNOSIS — M48061 Spinal stenosis, lumbar region without neurogenic claudication: Secondary | ICD-10-CM | POA: Diagnosis not present

## 2020-02-04 DIAGNOSIS — I10 Essential (primary) hypertension: Secondary | ICD-10-CM | POA: Diagnosis not present

## 2020-02-04 DIAGNOSIS — M961 Postlaminectomy syndrome, not elsewhere classified: Secondary | ICD-10-CM | POA: Diagnosis not present

## 2020-02-04 DIAGNOSIS — I639 Cerebral infarction, unspecified: Secondary | ICD-10-CM | POA: Diagnosis not present

## 2020-02-04 NOTE — Telephone Encounter (Signed)
Patient with diagnosis of afib on Eliquis 5mg  BID  for anticoagulation.    Procedure: spinal injection Date of procedure: TBD  CHADS2-VASc score of 6 (age, HTN, DM, stroke in 2017, CAD)  CrCl 41mL/min Platelet count 199K  Typically prefer to hold Eliquis for 3 days prior to spinal injection, however pt is at elevated risk off of anticoagulation due elevated CHADS2VASc score and history of stroke. Pt follows with both Dr 79m and Dr Johney Frame - will route to MDs for input on length of anticoag hold prior to procedure.

## 2020-02-04 NOTE — Telephone Encounter (Signed)
I suspect that we should hold for 3 days for back injection.  He will need to weigh risk of stroke with the need for the procedure.

## 2020-02-04 NOTE — Telephone Encounter (Signed)
   [facility] Pre-operative Risk Assessment    HEARTCARE STAFF: - Please ensure there is not already an duplicate clearance open for this procedure. - Under Visit Info/Reason for Call, type in Other and utilize the format Clearance MM/DD/YY or Clearance TBD. Do not use dashes or single digits. - If request is for dental extraction, please clarify the # of teeth to be extracted.  Request for surgical clearance:  1. What type of surgery is being performed? SPINAL INJECTION   2. When is this surgery scheduled? TBD   3. What type of clearance is required (medical clearance vs. Pharmacy clearance to hold med vs. Both)? PHARAMCY  4. Are there any medications that need to be held prior to surgery and how long? ELIQUIS FOR 3-5 DAYS PRIOR TO SURGERY   5. Practice name and name of physician performing surgery? [facility]   6. What is the office phone number? [phone]   7.   What is the office fax number? [phone]  8.   Anesthesia type (None, local, MAC, general) ? NONE LISTED   Jacinta Shoe 02/04/2020, 12:41 PM  _________________________________________________________________   (provider comments below)

## 2020-02-06 DIAGNOSIS — B356 Tinea cruris: Secondary | ICD-10-CM | POA: Diagnosis not present

## 2020-02-11 NOTE — Telephone Encounter (Signed)
I will route this recommendation to the requesting party via Epic fax function and remove from pre-op pool.  Please call with questions.  [place], [place] 02/11/2020, 8:19 AM

## 2020-02-19 DIAGNOSIS — E039 Hypothyroidism, unspecified: Secondary | ICD-10-CM | POA: Diagnosis not present

## 2020-02-19 DIAGNOSIS — E782 Mixed hyperlipidemia: Secondary | ICD-10-CM | POA: Diagnosis not present

## 2020-02-19 DIAGNOSIS — M4800 Spinal stenosis, site unspecified: Secondary | ICD-10-CM | POA: Diagnosis not present

## 2020-02-19 DIAGNOSIS — K219 Gastro-esophageal reflux disease without esophagitis: Secondary | ICD-10-CM | POA: Diagnosis not present

## 2020-02-19 DIAGNOSIS — I10 Essential (primary) hypertension: Secondary | ICD-10-CM | POA: Diagnosis not present

## 2020-03-06 DIAGNOSIS — Z7409 Other reduced mobility: Secondary | ICD-10-CM | POA: Diagnosis not present

## 2020-03-06 DIAGNOSIS — I129 Hypertensive chronic kidney disease with stage 1 through stage 4 chronic kidney disease, or unspecified chronic kidney disease: Secondary | ICD-10-CM | POA: Diagnosis not present

## 2020-03-06 DIAGNOSIS — B356 Tinea cruris: Secondary | ICD-10-CM | POA: Diagnosis not present

## 2020-03-06 DIAGNOSIS — R296 Repeated falls: Secondary | ICD-10-CM | POA: Diagnosis not present

## 2020-03-06 DIAGNOSIS — Z79891 Long term (current) use of opiate analgesic: Secondary | ICD-10-CM | POA: Diagnosis not present

## 2020-03-06 DIAGNOSIS — M4800 Spinal stenosis, site unspecified: Secondary | ICD-10-CM | POA: Diagnosis not present

## 2020-03-11 DIAGNOSIS — G629 Polyneuropathy, unspecified: Secondary | ICD-10-CM | POA: Diagnosis not present

## 2020-03-11 DIAGNOSIS — E782 Mixed hyperlipidemia: Secondary | ICD-10-CM | POA: Diagnosis not present

## 2020-03-11 DIAGNOSIS — E1122 Type 2 diabetes mellitus with diabetic chronic kidney disease: Secondary | ICD-10-CM | POA: Diagnosis not present

## 2020-03-11 DIAGNOSIS — I129 Hypertensive chronic kidney disease with stage 1 through stage 4 chronic kidney disease, or unspecified chronic kidney disease: Secondary | ICD-10-CM | POA: Diagnosis not present

## 2020-03-11 DIAGNOSIS — N189 Chronic kidney disease, unspecified: Secondary | ICD-10-CM | POA: Diagnosis not present

## 2020-03-12 DIAGNOSIS — E1122 Type 2 diabetes mellitus with diabetic chronic kidney disease: Secondary | ICD-10-CM | POA: Diagnosis not present

## 2020-03-12 DIAGNOSIS — M545 Low back pain: Secondary | ICD-10-CM | POA: Diagnosis not present

## 2020-03-12 DIAGNOSIS — G894 Chronic pain syndrome: Secondary | ICD-10-CM | POA: Diagnosis not present

## 2020-03-12 DIAGNOSIS — E1142 Type 2 diabetes mellitus with diabetic polyneuropathy: Secondary | ICD-10-CM | POA: Diagnosis not present

## 2020-03-12 DIAGNOSIS — Z0001 Encounter for general adult medical examination with abnormal findings: Secondary | ICD-10-CM | POA: Diagnosis not present

## 2020-03-19 ENCOUNTER — Telehealth: Payer: Self-pay | Admitting: Cardiology

## 2020-03-19 DIAGNOSIS — I480 Paroxysmal atrial fibrillation: Secondary | ICD-10-CM

## 2020-03-19 MED ORDER — APIXABAN 5 MG PO TABS: 5.0000 mg | ORAL_TABLET | Freq: Two times a day (BID) | ORAL | 6 refills | Status: DC

## 2020-03-19 NOTE — Addendum Note (Signed)
Addended by: Kerney Elbe on: 03/19/2020 05:08 PM   Modules accepted: Orders

## 2020-03-19 NOTE — Telephone Encounter (Signed)
New message      *STAT* If patient is at the pharmacy, call can be transferred to refill team.   1. Which medications need to be refilled? (please list name of each medication and dose if known)  eliquis 5 mg   2. Which pharmacy/location (including street and city if local pharmacy) is medication to be sent to? [facility] in [place]   3. Do they need a 30 day or 90 day supply?  90

## 2020-03-19 NOTE — Telephone Encounter (Signed)
Refill complete 

## 2020-03-21 DIAGNOSIS — M48061 Spinal stenosis, lumbar region without neurogenic claudication: Secondary | ICD-10-CM | POA: Diagnosis not present

## 2020-03-28 DIAGNOSIS — I129 Hypertensive chronic kidney disease with stage 1 through stage 4 chronic kidney disease, or unspecified chronic kidney disease: Secondary | ICD-10-CM | POA: Diagnosis not present

## 2020-03-28 DIAGNOSIS — E7849 Other hyperlipidemia: Secondary | ICD-10-CM | POA: Diagnosis not present

## 2020-03-28 DIAGNOSIS — E1122 Type 2 diabetes mellitus with diabetic chronic kidney disease: Secondary | ICD-10-CM | POA: Diagnosis not present

## 2020-03-28 DIAGNOSIS — N183 Chronic kidney disease, stage 3 unspecified: Secondary | ICD-10-CM | POA: Diagnosis not present

## 2020-03-28 DIAGNOSIS — E1142 Type 2 diabetes mellitus with diabetic polyneuropathy: Secondary | ICD-10-CM | POA: Diagnosis not present

## 2020-06-10 DIAGNOSIS — N183 Chronic kidney disease, stage 3 unspecified: Secondary | ICD-10-CM | POA: Diagnosis not present

## 2020-06-10 DIAGNOSIS — I129 Hypertensive chronic kidney disease with stage 1 through stage 4 chronic kidney disease, or unspecified chronic kidney disease: Secondary | ICD-10-CM | POA: Diagnosis not present

## 2020-06-10 DIAGNOSIS — E7849 Other hyperlipidemia: Secondary | ICD-10-CM | POA: Diagnosis not present

## 2020-06-10 DIAGNOSIS — E1122 Type 2 diabetes mellitus with diabetic chronic kidney disease: Secondary | ICD-10-CM | POA: Diagnosis not present

## 2020-06-10 DIAGNOSIS — E1142 Type 2 diabetes mellitus with diabetic polyneuropathy: Secondary | ICD-10-CM | POA: Diagnosis not present

## 2020-07-02 DIAGNOSIS — N183 Chronic kidney disease, stage 3 unspecified: Secondary | ICD-10-CM | POA: Diagnosis not present

## 2020-07-02 DIAGNOSIS — E7849 Other hyperlipidemia: Secondary | ICD-10-CM | POA: Diagnosis not present

## 2020-07-02 DIAGNOSIS — E1122 Type 2 diabetes mellitus with diabetic chronic kidney disease: Secondary | ICD-10-CM | POA: Diagnosis not present

## 2020-07-02 DIAGNOSIS — E1142 Type 2 diabetes mellitus with diabetic polyneuropathy: Secondary | ICD-10-CM | POA: Diagnosis not present

## 2020-07-02 DIAGNOSIS — I129 Hypertensive chronic kidney disease with stage 1 through stage 4 chronic kidney disease, or unspecified chronic kidney disease: Secondary | ICD-10-CM | POA: Diagnosis not present

## 2020-07-15 DIAGNOSIS — Z79891 Long term (current) use of opiate analgesic: Secondary | ICD-10-CM | POA: Diagnosis not present

## 2020-07-15 DIAGNOSIS — I129 Hypertensive chronic kidney disease with stage 1 through stage 4 chronic kidney disease, or unspecified chronic kidney disease: Secondary | ICD-10-CM | POA: Diagnosis not present

## 2020-07-15 DIAGNOSIS — Z7409 Other reduced mobility: Secondary | ICD-10-CM | POA: Diagnosis not present

## 2020-07-15 DIAGNOSIS — R296 Repeated falls: Secondary | ICD-10-CM | POA: Diagnosis not present

## 2020-07-15 DIAGNOSIS — Z0001 Encounter for general adult medical examination with abnormal findings: Secondary | ICD-10-CM | POA: Diagnosis not present

## 2020-07-15 DIAGNOSIS — Z79899 Other long term (current) drug therapy: Secondary | ICD-10-CM | POA: Diagnosis not present

## 2020-07-15 DIAGNOSIS — M4800 Spinal stenosis, site unspecified: Secondary | ICD-10-CM | POA: Diagnosis not present

## 2020-07-21 DIAGNOSIS — I129 Hypertensive chronic kidney disease with stage 1 through stage 4 chronic kidney disease, or unspecified chronic kidney disease: Secondary | ICD-10-CM | POA: Diagnosis not present

## 2020-07-21 DIAGNOSIS — E1142 Type 2 diabetes mellitus with diabetic polyneuropathy: Secondary | ICD-10-CM | POA: Diagnosis not present

## 2020-07-21 DIAGNOSIS — E1122 Type 2 diabetes mellitus with diabetic chronic kidney disease: Secondary | ICD-10-CM | POA: Diagnosis not present

## 2020-07-21 DIAGNOSIS — E782 Mixed hyperlipidemia: Secondary | ICD-10-CM | POA: Diagnosis not present

## 2020-07-21 DIAGNOSIS — G894 Chronic pain syndrome: Secondary | ICD-10-CM | POA: Diagnosis not present

## 2020-08-11 ENCOUNTER — Emergency Department (HOSPITAL_COMMUNITY): Payer: Medicare Other

## 2020-08-11 ENCOUNTER — Emergency Department (HOSPITAL_COMMUNITY)
Admission: EM | Admit: 2020-08-11 | Discharge: 2020-08-11 | Disposition: A | Discharge status: Home / Self Care | Payer: Medicare Other | Attending: Emergency Medicine | Admitting: Emergency Medicine

## 2020-08-11 ENCOUNTER — Other Ambulatory Visit: Payer: Self-pay

## 2020-08-11 DIAGNOSIS — Z950 Presence of cardiac pacemaker: Secondary | ICD-10-CM | POA: Insufficient documentation

## 2020-08-11 DIAGNOSIS — I4891 Unspecified atrial fibrillation: Secondary | ICD-10-CM | POA: Insufficient documentation

## 2020-08-11 DIAGNOSIS — E1122 Type 2 diabetes mellitus with diabetic chronic kidney disease: Secondary | ICD-10-CM | POA: Insufficient documentation

## 2020-08-11 DIAGNOSIS — Z87891 Personal history of nicotine dependence: Secondary | ICD-10-CM | POA: Insufficient documentation

## 2020-08-11 DIAGNOSIS — Z7984 Long term (current) use of oral hypoglycemic drugs: Secondary | ICD-10-CM | POA: Insufficient documentation

## 2020-08-11 DIAGNOSIS — R079 Chest pain, unspecified: Secondary | ICD-10-CM | POA: Diagnosis not present

## 2020-08-11 DIAGNOSIS — Z7901 Long term (current) use of anticoagulants: Secondary | ICD-10-CM | POA: Diagnosis not present

## 2020-08-11 DIAGNOSIS — R6889 Other general symptoms and signs: Secondary | ICD-10-CM | POA: Diagnosis not present

## 2020-08-11 DIAGNOSIS — I2510 Atherosclerotic heart disease of native coronary artery without angina pectoris: Secondary | ICD-10-CM | POA: Diagnosis not present

## 2020-08-11 DIAGNOSIS — Z743 Need for continuous supervision: Secondary | ICD-10-CM | POA: Diagnosis not present

## 2020-08-11 DIAGNOSIS — I129 Hypertensive chronic kidney disease with stage 1 through stage 4 chronic kidney disease, or unspecified chronic kidney disease: Secondary | ICD-10-CM | POA: Insufficient documentation

## 2020-08-11 DIAGNOSIS — R0789 Other chest pain: Secondary | ICD-10-CM | POA: Diagnosis not present

## 2020-08-11 DIAGNOSIS — N183 Chronic kidney disease, stage 3 unspecified: Secondary | ICD-10-CM | POA: Insufficient documentation

## 2020-08-11 DIAGNOSIS — E039 Hypothyroidism, unspecified: Secondary | ICD-10-CM | POA: Diagnosis not present

## 2020-08-11 DIAGNOSIS — Z79899 Other long term (current) drug therapy: Secondary | ICD-10-CM | POA: Diagnosis not present

## 2020-08-11 DIAGNOSIS — R0602 Shortness of breath: Secondary | ICD-10-CM | POA: Diagnosis not present

## 2020-08-11 LAB — CBC WITH DIFFERENTIAL/PLATELET
Abs Immature Granulocytes: 0.02 10*3/uL (ref 0.00–0.07)
Basophils Absolute: 0 10*3/uL (ref 0.0–0.1)
Basophils Relative: 0 %
Eosinophils Absolute: 0.3 10*3/uL (ref 0.0–0.5)
Eosinophils Relative: 4 %
HCT: 38.6 % — ABNORMAL LOW (ref 39.0–52.0)
Hemoglobin: 12.5 g/dL — ABNORMAL LOW (ref 13.0–17.0)
Immature Granulocytes: 0 %
Lymphocytes Relative: 19 %
Lymphs Abs: 1.5 10*3/uL (ref 0.7–4.0)
MCH: 30.8 pg (ref 26.0–34.0)
MCHC: 32.4 g/dL (ref 30.0–36.0)
MCV: 95.1 fL (ref 80.0–100.0)
Monocytes Absolute: 0.7 10*3/uL (ref 0.1–1.0)
Monocytes Relative: 8 %
Neutro Abs: 5.5 10*3/uL (ref 1.7–7.7)
Neutrophils Relative %: 69 %
Platelets: 146 10*3/uL — ABNORMAL LOW (ref 150–400)
RBC: 4.06 MIL/uL — ABNORMAL LOW (ref 4.22–5.81)
RDW: 13.7 % (ref 11.5–15.5)
WBC: 8 10*3/uL (ref 4.0–10.5)
nRBC: 0 % (ref 0.0–0.2)

## 2020-08-11 LAB — COMPREHENSIVE METABOLIC PANEL
ALT: 16 U/L (ref 0–44)
AST: 20 U/L (ref 15–41)
Albumin: 3.4 g/dL — ABNORMAL LOW (ref 3.5–5.0)
Alkaline Phosphatase: 58 U/L (ref 38–126)
Anion gap: 11 (ref 5–15)
BUN: 15 mg/dL (ref 8–23)
CO2: 20 mmol/L — ABNORMAL LOW (ref 22–32)
Calcium: 8.9 mg/dL (ref 8.9–10.3)
Chloride: 108 mmol/L (ref 98–111)
Creatinine, Ser: 1.34 mg/dL — ABNORMAL HIGH (ref 0.61–1.24)
GFR, Estimated: 56 mL/min — ABNORMAL LOW (ref >60)
Glucose, Bld: 82 mg/dL (ref 70–99)
Potassium: 4.3 mmol/L (ref 3.5–5.1)
Sodium: 139 mmol/L (ref 135–145)
Total Bilirubin: 0.7 mg/dL (ref 0.3–1.2)
Total Protein: 6.4 g/dL — ABNORMAL LOW (ref 6.5–8.1)

## 2020-08-11 LAB — TROPONIN I (HIGH SENSITIVITY)
Troponin I (High Sensitivity): 5 ng/L (ref <18)
Troponin I (High Sensitivity): <2 ng/L (ref <18)

## 2020-08-11 NOTE — Discharge Instructions (Signed)
Continue taking home medications as prescribed. Follow-up with your cardiologist tomorrow to schedule appointment.  Make sure you discuss your episode of chest pain that happened today. Return to the emergency room if you develop chest pain, difficulty breathing, you become very sweaty and nauseous, or with any new, sudden, or concerning symptoms.

## 2020-08-11 NOTE — ED Triage Notes (Signed)
BIB [facility] w/ complaints of chest pain that started about an hour and 15 minutes ago. The pain is located left chest and radiates to left arm describing it as sharp. Pt denies SOB, n/v/d. Pt did take 3 Nitro sublingual nitro before EMS came with no immediate relief. EMS did not administer ASA because pt refused due to being on a blood thinner. VSS w. EMS  BP- 120/66  HR- 70  SpO2-97% EKG showed paced rhythm as pt does have a pacemaker.

## 2020-08-11 NOTE — ED Provider Notes (Signed)
[facility] EMERGENCY DEPARTMENT Provider Note   CSN: [id number] Arrival date & time: 08/11/20  1542     History Chief Complaint  Patient presents with   Chest Pain    [name] is a 74 y.o. male presenting for evaluation of chest pain.   Pt states about 1 hr pta he developed chest pain that radiated to his L shoulder. No associated sob, n/d, diaphoresis. He took a total of 3 SL nitro, but did not have improvement. Normally he does, and thus he called ems. He is on eliquis, has been taking it as prescribe.d thus refused asa with ems. He is currently CP free.  He denies recent fevers, chills, cough, shortness of breath, nausea, vomiting, Donnell pain, urinary symptoms, normal bowel movements.  He does have a pacemaker and, is not an ICD as well.  He follows with cardiology, has an appointment tomorrow.  He denies recent medication changes.  He denies sick contacts.  Additional history taken chart reviewed.  Patient with a history of anxiety, CAD, depression, diabetes, hypertension, hypothyroidism, A. fib on a pacemaker and anticoagulation, stroke, CKD.  HPI     Past Medical History:  Diagnosis Date   Alcohol use    Anxiety    Chest pain    Coronary artery disease    a. Nonobstructive CAD by cath 2010 with negative nuclear stress test 05/2013.   Depression    Diabetes mellitus    Hyperlipidemia    Hypertension    Hypothyroidism    Kidney problem    a. 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   Mild aortic insufficiency    PAT (paroxysmal atrial tachycardia) ([facility])    Pericardial effusion    a. by echo 2014.   Pulmonary nodules    resolved   PVC's (premature ventricular contractions)    Stroke (cerebrum) ([facility])    Stroke due to embolism of left carotid artery ([facility]) 03/24/2016   Left caudate head stroke   Wide-complex tachycardia ([facility])    a. per [name] note: Event monitor September 2014 sinus rhythm, occasional PVCs,  nonsustained PAT, and one 7 beat run of a wide QRS complex at 110-120 bpm, with minimal changes QRS axis, and QRS morphology not similar to documented PVCs.     Patient Active Problem List   Diagnosis Date Noted   Cellulitis and abscess of left lower extremity 12/04/2019   Chest pain at rest 11/02/2018   Type 2 diabetes mellitus without complication ([facility]) 11/02/2018   Unstable angina ([facility]) 03/08/2018   Hypothyroidism 03/08/2018   Anxiety 03/08/2018   RUQ pain 03/08/2018   Chronic back pain 03/08/2018   CKD (chronic kidney disease) stage 3, GFR 30-59 ml/min ([facility]) 03/08/2018   Intractable back pain 12/29/2017   Chest pain 12/28/2017   Abnormality of gait 03/24/2016   Stroke due to embolism of left carotid artery ([facility]) 03/24/2016   HLD (hyperlipidemia) 05/22/2009   Essential hypertension 05/22/2009   CAD, NATIVE VESSEL 05/22/2009   CHEST PAIN-UNSPECIFIED 05/22/2009    Past Surgical History:  Procedure Laterality Date   BACK SURGERY  1996   BREAST LUMPECTOMY Left 1979   CIRCUMCISION  1980   MANDIBLE SURGERY Right 1969   PACEMAKER IMPLANT N/A 06/26/2019   Procedure: PACEMAKER IMPLANT;  Surgeon: Hillis Range, MD;  Location: [name];  Service: Cardiovascular;  Laterality: N/A;       Family History  Problem Relation Age of Onset   Lung cancer Mother    Diabetes  Father    Bladder Cancer Father    Colon cancer Brother    Stroke Maternal Grandmother    Cancer Maternal Grandfather    Stroke Paternal Grandmother    Stroke Paternal Grandfather     Social History   Tobacco Use   Smoking status: Former Smoker    Quit date: 1984    Years since quitting: 37.9   Smokeless tobacco: Never Used  Building services engineer Use: Never used  Substance Use Topics   Alcohol use: Not Currently   Drug use: No    Home Medications Prior to Admission medications   Medication Sig Start Date End Date Taking? Authorizing Provider  acetaminophen  (TYLENOL) 500 MG tablet Take 500 mg by mouth every 8 (eight) hours as needed for moderate pain or headache.   Yes [provider]  ALPRAZolam Prudy Feeler) 1 MG tablet Take 1 mg by mouth in the morning and at bedtime. 01/18/16  Yes [provider]  apixaban (ELIQUIS) 5 MG TABS tablet Take 1 tablet (5 mg total) by mouth 2 (two) times daily. 03/19/20  Yes BranchDorothe Pea, MD  atorvastatin (LIPITOR) 20 MG tablet Take 20 mg by mouth daily.  01/16/16  Yes [provider]  buPROPion (WELLBUTRIN SR) 100 MG 12 hr tablet Take 100 mg by mouth every morning. 11/28/19  Yes [provider]  diphenhydrAMINE (BENADRYL) 25 MG tablet Take 25 mg by mouth daily as needed for allergies.   Yes [provider]  gabapentin (NEURONTIN) 600 MG tablet Take 600 mg by mouth with breakfast, with lunch, and with evening meal. 12/17/17  Yes [provider]  glimepiride (AMARYL) 2 MG tablet Take 2 mg by mouth daily.  03/16/16  Yes [provider]  levothyroxine (SYNTHROID) 125 MCG tablet Take 125 mcg by mouth daily before breakfast. 11/28/19  Yes [provider]  lisinopril (ZESTRIL) 2.5 MG tablet Take 2.5 mg by mouth in the morning.   Yes [provider]  metFORMIN (GLUCOPHAGE) 500 MG tablet Take 1-2 tablets (500-1,000 mg total) by mouth See admin instructions. Take 1000 mg in the morning and 500 mg in the evening Patient taking differently: Take 500-1,000 mg by mouth See admin instructions. Take 1,000 mg by mouth in the morning and 500 mg in the evening 12/07/19  Yes Johnson, Clanford L, MD  nitroGLYCERIN (NITROSTAT) 0.4 MG SL tablet Place 0.4 mg under the tongue every 5 (five) minutes as needed for chest pain.  03/08/16  Yes [provider]  omega-3 acid ethyl esters (LOVAZA) 1 g capsule Take 2 g by mouth 2 (two) times daily. 12/17/17  Yes [provider]  oxyCODONE-acetaminophen (PERCOCET) 10-325 MG tablet Take 1 tablet by mouth every 4 (four)  hours. 02/08/18  Yes [provider]    Allergies    Morphine, Tape, and Sulfonamide derivatives  Review of Systems   Review of Systems  Cardiovascular: Positive for chest pain (resolved).  Hematological: Bruises/bleeds easily.  All other systems reviewed and are negative.   Physical Exam Updated Vital Signs BP (!) 136/59    Pulse 73    Resp (!) 24    Ht 5\' 9"  (1.753 m)    Wt 79.4 kg    SpO2 98%    BMI 25.84 kg/m   Physical Exam Vitals and nursing note reviewed.  Constitutional:      General: He is not in acute distress.    Appearance: He is well-developed and well-nourished.     Comments:  Resting comfortably in the bed in no acute distress  HENT:     Head: Normocephalic and atraumatic.  Eyes:     Extraocular Movements: EOM normal.     Conjunctiva/sclera: Conjunctivae normal.     Pupils: Pupils are equal, round, and reactive to light.  Cardiovascular:     Rate and Rhythm: Normal rate and regular rhythm.     Pulses: Normal pulses and intact distal pulses.     Comments: RRR Pulmonary:     Effort: Pulmonary effort is normal. No respiratory distress.     Breath sounds: Normal breath sounds. No wheezing.  Abdominal:     General: There is no distension.     Palpations: Abdomen is soft. There is no mass.     Tenderness: There is no abdominal tenderness. There is no guarding or rebound.  Musculoskeletal:        General: Normal range of motion.     Cervical back: Normal range of motion and neck supple.     Right lower leg: No edema.     Left lower leg: No edema.  Skin:    General: Skin is warm and dry.     Capillary Refill: Capillary refill takes less than 2 seconds.  Neurological:     Mental Status: He is alert and oriented to person, place, and time.  Psychiatric:        Mood and Affect: Mood and affect normal.     ED Results / Procedures / Treatments   Labs (all labs ordered are listed, but only abnormal results are displayed) Labs Reviewed  CBC WITH  DIFFERENTIAL/PLATELET - Abnormal; Notable for the following components:      Result Value   RBC 4.06 (*)    Hemoglobin 12.5 (*)    HCT 38.6 (*)    Platelets 146 (*)    All other components within normal limits  COMPREHENSIVE METABOLIC PANEL - Abnormal; Notable for the following components:   CO2 20 (*)    Creatinine, Ser 1.34 (*)    Total Protein 6.4 (*)    Albumin 3.4 (*)    GFR, Estimated 56 (*)    All other components within normal limits  TROPONIN I (HIGH SENSITIVITY)  TROPONIN I (HIGH SENSITIVITY)    EKG EKG Interpretation  Date/Time:  Monday August 11 2020 18:22:34 EST Ventricular Rate:  71 PR Interval:    QRS Duration: 101 QT Interval:  385 QTC Calculation: 419 R Axis:   -57 Text Interpretation: Sinus rhythm LAD, consider left anterior fascicular block Minimal ST elevation, anterior leads NSR, isolated STE in anterior leads present on previous EKG Confirmed by Coralee Pesa [phone]) on 08/11/2020 7:32:51 PM   Radiology DG Chest 2 View  Result Date: 08/11/2020 CLINICAL DATA:  Chest pain EXAM: CHEST - 2 VIEW COMPARISON:  June 26, 2019 FINDINGS: There is a dual chamber left-sided pacemaker in place. The heart size is normal. Aortic calcifications are noted. There is no pneumothorax. No large pleural effusion. IMPRESSION: No active cardiopulmonary disease. Electronically Signed   By: Katherine Mantle M.D.   On: 08/11/2020 16:45    Procedures Procedures (including critical care time)  Medications Ordered in ED Medications - No data to display  ED Course  I have reviewed the triage vital signs and the nursing notes.  Pertinent labs & imaging results that were available during my care of the patient were reviewed by me and considered in my medical decision making (see chart for details).    MDM Rules/Calculators/A&P  Patient is in for evaluation of chest pain.  On exam, patient appears nontoxic.  He is not currently having any chest  pain, resolved with nitro.  He does have significant cardiac risk factors/disease.  As such, will obtain labs, EKG, chest x-ray.  Patient will need delta troponin.  Initial labs interpreted by me, overall reassuring.  Creatinine at baseline.  Electrolytes stable.  Initial troponin negative.  EKG without signs of ischemia.  Chest x-ray viewed interpreted by me, no pneumonia pneumothorax, effusion.  Delta troponin pending.  Repeat troponin negative.  Patient remains chest pain-free after total of 4 hours in the ER.  He has follow-up with cardiology tomorrow and is requesting discharge.  As his initial work-up was overall reassuring and he remained chest pain-free, I am agreeable with discharge.  Case discussed with attending, Dr. Wilkie AyeHorton evaluated the patient.  At this time, patient appears safe for discharge.  Return precautions given.  Patient states he understands and agrees to plan.  Final Clinical Impression(s) / ED Diagnoses Final diagnoses:  Nonspecific chest pain    Rx / DC Orders ED Discharge Orders    None       Alveria ApleyCaccavale, Levita Monical, PA-C 08/11/20 2010    Rozelle LoganHorton, Kristie M, DO 08/11/20 2015

## 2020-08-11 NOTE — ED Notes (Signed)
Placed call for [employer] representative for pacemaker interrogation.

## 2020-08-12 ENCOUNTER — Telehealth: Payer: Self-pay

## 2020-08-12 ENCOUNTER — Encounter: Payer: Self-pay | Admitting: Cardiology

## 2020-08-12 ENCOUNTER — Ambulatory Visit (INDEPENDENT_AMBULATORY_CARE_PROVIDER_SITE_OTHER): Payer: Medicare Other | Admitting: Cardiology

## 2020-08-12 VITALS — BP 146/70 | HR 88 | Ht 69.0 in | Wt 182.0 lb

## 2020-08-12 DIAGNOSIS — R0789 Other chest pain: Secondary | ICD-10-CM

## 2020-08-12 DIAGNOSIS — I4891 Unspecified atrial fibrillation: Secondary | ICD-10-CM

## 2020-08-12 NOTE — Progress Notes (Signed)
Clinical Summary [name] is a 74 y.o.male seen today for follow up of the following medical problems.  1. PAF - noted during 10/2018 admission - hadnot been on anticoag due to frequent falls previously.  - since pacemaker placement falls have resolved, started on eliquis 5mg  bid.  - occasinal palpitations, rare. No bleeding on eliquis.   2. Bradycardia - chronicaccording ot notes. 10/2018 admissoin had some rates to 40s and 50s - heart monitor showed bradycardia and post termination pausesup to 3.2 seconds  06/26/19 pacemaker [employer] Jude medical assurity dual chamber placed - followed by Dr 13/3/20  12/2019 appt with normal device check.   3.Chest pain - extensive prior workup including cath in 2010 and stress test 2014 - admit 10/2018 with negative enzymes and echo - 04/2019 stress test no ischemia  -seen in ER yesterday with chest pain - trop neg x 2. EKG SR, no acute ischemic changes - came one at rest, felt nauseous and layed down and chest started hurting. Midleft chest, stabbing pain, 5/10 in severity. Not positional. +SOB. Pain lasted about 1 hour, self resolved.   4. Orthostatic dizziness - rare symptoms. Improved since stopping lisinoopril     Prior cardiology notes  Prevoiusly followed by [name] cardiology. From notes had normal cardiac monitor 2016.echocardiogram October 2014 ejection fraction 50-55%, G1DD, mild left atrial margin, 1+ AR, 1+ TR, no significant pericardial effusion -Echocardiogram interpreted by Dr. 06-27-2006 [name] office), September 2014, preserved ejection fraction, small pericardial effusion, 1+ MR, 2+ AR  -Event monitor September 2014 sinus rhythm, occasional PVCs, nonsustained PAT, and one 7 beat run of a wide QRS complex at 110-120 bpm, with minimal changes QRS axis, and QRS morphology not similar to documented PVCs.  2. Heart catheterization 2010 nonobstructive coronary disease  -exercise Cardiolite stress test negative  for ischemia with ejection fraction 61% October 2014   Past Medical History:  Diagnosis Date  . Alcohol use   . Anxiety   . Chest pain   . Coronary artery disease    a. Nonobstructive CAD by cath 2010 with negative nuclear stress test 05/2013.  . Depression   . Diabetes mellitus   . Hyperlipidemia   . Hypertension   . Hypothyroidism   . Kidney problem    a. 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  . Mild aortic insufficiency   . PAT (paroxysmal atrial tachycardia) (HCC)   . Pericardial effusion    a. by echo 2014.  . Pulmonary nodules    resolved  . PVC's (premature ventricular contractions)   . Stroke (cerebrum) (HCC)   . 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 Wide-complex tachycardia (HCC)    a. per [name] note: Event monitor September 2014 sinus rhythm, occasional PVCs, nonsustained PAT, and one 7 beat run of a wide QRS complex at 110-120 bpm, with minimal changes QRS axis, and QRS morphology not similar to documented PVCs.      Allergies  Allergen Reactions  . Morphine Other (See Comments)    Affected the heart- was told to not take this again  . Tape Other (See Comments)    Certain tapes TEAR THE SKIN  . Sulfonamide Derivatives Rash and Other (See Comments)    Blistering on the skin     Current Outpatient Medications  Medication Sig Dispense Refill  . acetaminophen (TYLENOL) 500 MG tablet Take 500 mg by mouth every 8 (eight) hours as needed for moderate  pain or headache.    . ALPRAZolam (XANAX) 1 MG tablet Take 1 mg by mouth in the morning and at bedtime.  2  . apixaban (ELIQUIS) 5 MG TABS tablet Take 1 tablet (5 mg total) by mouth 2 (two) times daily. 60 tablet 6  . atorvastatin (LIPITOR) 20 MG tablet Take 20 mg by mouth daily.     Marland Kitchen buPROPion (WELLBUTRIN SR) 100 MG 12 hr tablet Take 100 mg by mouth every morning.    . diphenhydrAMINE (BENADRYL) 25 MG tablet Take 25 mg by mouth daily as needed for  allergies.    Marland Kitchen gabapentin (NEURONTIN) 600 MG tablet Take 600 mg by mouth with breakfast, with lunch, and with evening meal.  1  . glimepiride (AMARYL) 2 MG tablet Take 2 mg by mouth daily.     Marland Kitchen levothyroxine (SYNTHROID) 125 MCG tablet Take 125 mcg by mouth daily before breakfast.    . lisinopril (ZESTRIL) 2.5 MG tablet Take 2.5 mg by mouth in the morning.    . metFORMIN (GLUCOPHAGE) 500 MG tablet Take 1-2 tablets (500-1,000 mg total) by mouth See admin instructions. Take 1000 mg in the morning and 500 mg in the evening (Patient taking differently: Take 500-1,000 mg by mouth See admin instructions. Take 1,000 mg by mouth in the morning and 500 mg in the evening)    . nitroGLYCERIN (NITROSTAT) 0.4 MG SL tablet Place 0.4 mg under the tongue every 5 (five) minutes as needed for chest pain.     Marland Kitchen omega-3 acid ethyl esters (LOVAZA) 1 g capsule Take 2 g by mouth 2 (two) times daily.  12  . oxyCODONE-acetaminophen (PERCOCET) 10-325 MG tablet Take 1 tablet by mouth every 4 (four) hours.  0   No current facility-administered medications for this visit.     Past Surgical History:  Procedure Laterality Date  . BACK SURGERY  1996  . BREAST LUMPECTOMY Left 1979  . CIRCUMCISION  1980  . MANDIBLE SURGERY Right 1969  . PACEMAKER IMPLANT N/A 06/26/2019   Procedure: PACEMAKER IMPLANT;  Surgeon: Hillis Range, MD;  Location: [name];  Service: Cardiovascular;  Laterality: N/A;     Allergies  Allergen Reactions  . Morphine Other (See Comments)    Affected the heart- was told to not take this again  . Tape Other (See Comments)    Certain tapes TEAR THE SKIN  . Sulfonamide Derivatives Rash and Other (See Comments)    Blistering on the skin      Family History  Problem Relation Age of Onset  . Lung cancer Mother   . Diabetes Father   . Bladder Cancer Father   . Colon cancer Brother   . Stroke Maternal Grandmother   . Cancer Maternal Grandfather   . Stroke Paternal Grandmother   .  Stroke Paternal Grandfather      Social History [name] reports that he quit smoking about 37 years ago. He has never used smokeless tobacco. [name] reports previous alcohol use.   Review of Systems CONSTITUTIONAL: No weight loss, fever, chills, weakness or fatigue.  HEENT: Eyes: No visual loss, blurred vision, double vision or yellow sclerae.No hearing loss, sneezing, congestion, runny nose or sore throat.  SKIN: No rash or itching.  CARDIOVASCULAR: per hpi RESPIRATORY: No shortness of breath, cough or sputum.  GASTROINTESTINAL: No anorexia, nausea, vomiting or diarrhea. No abdominal pain or blood.  GENITOURINARY: No burning on urination, no polyuria NEUROLOGICAL: No headache, dizziness, syncope, paralysis, ataxia, numbness or tingling  in the extremities. No change in bowel or bladder control.  MUSCULOSKELETAL: No muscle, back pain, joint pain or stiffness.  LYMPHATICS: No enlarged nodes. No history of splenectomy.  PSYCHIATRIC: No history of depression or anxiety.  ENDOCRINOLOGIC: No reports of sweating, cold or heat intolerance. No polyuria or polydipsia.  Marland Kitchen   Physical Examination Today's Vitals   08/12/20 1451  BP: (!) 146/70  Pulse: 88  SpO2: 98%  Weight: 182 lb (82.6 kg)  Height: 5\' 9"  (1.753 m)   Body mass index is 26.88 kg/m.  Gen: resting comfortably, no acute distress HEENT: no scleral icterus, pupils equal round and reactive, no palptable cervical adenopathy,  CV: RRR, no m/rg, no jvd Resp: Clear to auscultation bilaterally GI: abdomen is soft, non-tender, non-distended, normal bowel sounds, no hepatosplenomegaly MSK: extremities are warm, no edema.  Skin: warm, no rash Neuro:  no focal deficits Psych: appropriate affect   Diagnostic Studies 01/2019 event monitor  7 day event monitor. Date is available from 83% of planned monitored time  Min HR 52, Max HR 111, Avg HR 65  Sinus rhythm with episodes of aflutter with variable conduction, no  significant RVR. Can have postconversion pause up to 3.2 seconds.  No symptoms reported   10/2018 echo IMPRESSIONS    1. The left ventricle has normal systolic function with an ejection  fraction of 60-65%. The cavity size was normal. There is mild concentric  left ventricular hypertrophy. Left ventricular diastolic Doppler  parameters are consistent with impaired  relaxation. Indeterminate filling pressures No evidence of left  ventricular regional wall motion abnormalities.  2. The right ventricle has normal systolic function. The cavity was  normal. There is no increase in right ventricular wall thickness.  3. The tricuspid valve is grossly normal.  4. The aortic valve is tricuspid Aortic valve regurgitation is mild by  color flow Doppler.  5. There is mild dilatation of the aortic root measuring 38 mm.  6. The interatrial septum was not well visualized.    04/2019 nuclear stress  There was no ST segment deviation noted during stress.  Findings consistent with prior inferior/inferoseptal/inferoapical myocardial infarction. No current ischemia  This is a low risk study.  The left ventricular ejection fraction is normal (55-65%).     Assessment and Plan  1. Chest pain - long history of chest pain symptoms with negative workups in the past including cath, multiple stress tests most recently 04/2019 - ER visti yesterday with isolated chest pain, no recurrence. Negative ER workup - monitor at this time, in absence of significant progression or recurrence would not plan for repeat ischemic testing   2. Afib - overall doing well, continue current meds     05/2019, M.D.

## 2020-08-12 NOTE — Telephone Encounter (Signed)
I called the patient and he states he was not with the monitor. He will call me when he get near the monitor. I gave him my direct office number.

## 2020-08-12 NOTE — Telephone Encounter (Signed)
-----   Message from Albertine Patricia, CMA sent at 08/12/2020  3:24 PM EST ----- This pt was seen by Dr Wyline Mood today and says he is having trouble with his home device, Dr Wyline Mood asked if someone could call him, thank you  [facility]

## 2020-08-12 NOTE — Patient Instructions (Signed)

## 2020-08-13 NOTE — Telephone Encounter (Signed)
Calling patient in regards to his remote monitor. Patient states he fell out of bed this morning d/t losing his balance while tieing his shoe. Reports of hitting his head and his hip. Patient is currently taking Eliquis. Denies loc, states he does have bruise noted on his head. Spoke to wife and highly encouraged the patient call PCP in regards to fall and that since he is taking an OAC he should be evaluated. Agreeable to plan. Advised we will call back about monitor.

## 2020-08-14 NOTE — Telephone Encounter (Signed)
Spoke with pt, he reports he is feeling better today.  Discussed the remote monitor with pt. Pt reports monitor is plugged in.  Attempts to send transmission, unsuccessful.  Conferenced [employer] tech support on the line.  It was determined that pt is missing cellular adaptor.  [facility] sending him adaptor today, pt instructed to call when he receives piece.

## 2020-08-21 ENCOUNTER — Ambulatory Visit (INDEPENDENT_AMBULATORY_CARE_PROVIDER_SITE_OTHER): Payer: Medicare Other

## 2020-08-21 DIAGNOSIS — R001 Bradycardia, unspecified: Secondary | ICD-10-CM

## 2020-08-21 DIAGNOSIS — I480 Paroxysmal atrial fibrillation: Secondary | ICD-10-CM

## 2020-08-22 DIAGNOSIS — E1122 Type 2 diabetes mellitus with diabetic chronic kidney disease: Secondary | ICD-10-CM | POA: Diagnosis not present

## 2020-08-22 DIAGNOSIS — E782 Mixed hyperlipidemia: Secondary | ICD-10-CM | POA: Diagnosis not present

## 2020-08-22 DIAGNOSIS — G629 Polyneuropathy, unspecified: Secondary | ICD-10-CM | POA: Diagnosis not present

## 2020-08-22 LAB — CUP PACEART REMOTE DEVICE CHECK
Battery Remaining Longevity: 123 mo
Battery Remaining Percentage: 95.5 %
Battery Voltage: 3.01 V
Brady Statistic AP VP Percent: 1 %
Brady Statistic AP VS Percent: 2.6 %
Brady Statistic AS VP Percent: 1 %
Brady Statistic AS VS Percent: 97 %
Brady Statistic RA Percent Paced: 2.2 %
Brady Statistic RV Percent Paced: 1.5 %
Date Time Interrogation Session: 20211230223446
Implantable Lead Implant Date: 20201103
Implantable Lead Implant Date: 20201103
Implantable Lead Location: 753859
Implantable Lead Location: 753860
Implantable Pulse Generator Implant Date: 20201103
Lead Channel Impedance Value: 440 Ohm
Lead Channel Impedance Value: 560 Ohm
Lead Channel Pacing Threshold Amplitude: 0.5 V
Lead Channel Pacing Threshold Amplitude: 0.75 V
Lead Channel Pacing Threshold Pulse Width: 0.5 ms
Lead Channel Pacing Threshold Pulse Width: 0.5 ms
Lead Channel Sensing Intrinsic Amplitude: 1.1 mV
Lead Channel Sensing Intrinsic Amplitude: 12 mV
Lead Channel Setting Pacing Amplitude: 2 V
Lead Channel Setting Pacing Amplitude: 2.5 V
Lead Channel Setting Pacing Pulse Width: 0.5 ms
Lead Channel Setting Sensing Sensitivity: 2 mV
Pulse Gen Model: 2272
Pulse Gen Serial Number: 9174806

## 2020-09-04 NOTE — Progress Notes (Signed)
Remote pacemaker transmission.   

## 2020-09-20 DIAGNOSIS — R296 Repeated falls: Secondary | ICD-10-CM | POA: Diagnosis not present

## 2020-09-20 DIAGNOSIS — R1013 Epigastric pain: Secondary | ICD-10-CM | POA: Diagnosis not present

## 2020-09-20 DIAGNOSIS — Z0000 Encounter for general adult medical examination without abnormal findings: Secondary | ICD-10-CM | POA: Diagnosis not present

## 2020-09-20 DIAGNOSIS — G9009 Other idiopathic peripheral autonomic neuropathy: Secondary | ICD-10-CM | POA: Diagnosis not present

## 2020-09-20 DIAGNOSIS — E119 Type 2 diabetes mellitus without complications: Secondary | ICD-10-CM | POA: Diagnosis not present

## 2020-09-20 DIAGNOSIS — I10 Essential (primary) hypertension: Secondary | ICD-10-CM | POA: Diagnosis not present

## 2020-09-20 DIAGNOSIS — E782 Mixed hyperlipidemia: Secondary | ICD-10-CM | POA: Diagnosis not present

## 2020-09-20 DIAGNOSIS — R079 Chest pain, unspecified: Secondary | ICD-10-CM | POA: Diagnosis not present

## 2020-10-20 DIAGNOSIS — R296 Repeated falls: Secondary | ICD-10-CM | POA: Diagnosis not present

## 2020-10-20 DIAGNOSIS — G9009 Other idiopathic peripheral autonomic neuropathy: Secondary | ICD-10-CM | POA: Diagnosis not present

## 2020-10-20 DIAGNOSIS — G8929 Other chronic pain: Secondary | ICD-10-CM | POA: Diagnosis not present

## 2020-10-20 DIAGNOSIS — E119 Type 2 diabetes mellitus without complications: Secondary | ICD-10-CM | POA: Diagnosis not present

## 2020-10-20 DIAGNOSIS — I10 Essential (primary) hypertension: Secondary | ICD-10-CM | POA: Diagnosis not present

## 2020-10-20 DIAGNOSIS — R1013 Epigastric pain: Secondary | ICD-10-CM | POA: Diagnosis not present

## 2020-10-20 DIAGNOSIS — E782 Mixed hyperlipidemia: Secondary | ICD-10-CM | POA: Diagnosis not present

## 2020-10-20 DIAGNOSIS — R079 Chest pain, unspecified: Secondary | ICD-10-CM | POA: Diagnosis not present

## 2020-10-20 DIAGNOSIS — Z0000 Encounter for general adult medical examination without abnormal findings: Secondary | ICD-10-CM | POA: Diagnosis not present

## 2020-10-20 DIAGNOSIS — K219 Gastro-esophageal reflux disease without esophagitis: Secondary | ICD-10-CM | POA: Diagnosis not present

## 2020-11-07 ENCOUNTER — Encounter: Payer: Self-pay | Admitting: Family Medicine

## 2020-11-07 DIAGNOSIS — I129 Hypertensive chronic kidney disease with stage 1 through stage 4 chronic kidney disease, or unspecified chronic kidney disease: Secondary | ICD-10-CM | POA: Diagnosis not present

## 2020-11-07 DIAGNOSIS — E1142 Type 2 diabetes mellitus with diabetic polyneuropathy: Secondary | ICD-10-CM | POA: Diagnosis not present

## 2020-11-07 DIAGNOSIS — E039 Hypothyroidism, unspecified: Secondary | ICD-10-CM | POA: Diagnosis not present

## 2020-11-07 DIAGNOSIS — D696 Thrombocytopenia, unspecified: Secondary | ICD-10-CM | POA: Diagnosis not present

## 2020-11-07 DIAGNOSIS — E1122 Type 2 diabetes mellitus with diabetic chronic kidney disease: Secondary | ICD-10-CM | POA: Diagnosis not present

## 2020-11-07 IMAGING — DX DG HIP (WITH OR WITHOUT PELVIS) 2-3V*L*
3 series · 3 of 3 positions shown · non-contrast
Comparison: None.

CLINICAL DATA: Patient fell backwards yesterday landing on back.
Severe lower back pain.

EXAM:
DG HIP (WITH OR WITHOUT PELVIS) 2-3V LEFT

[pelvis ap]
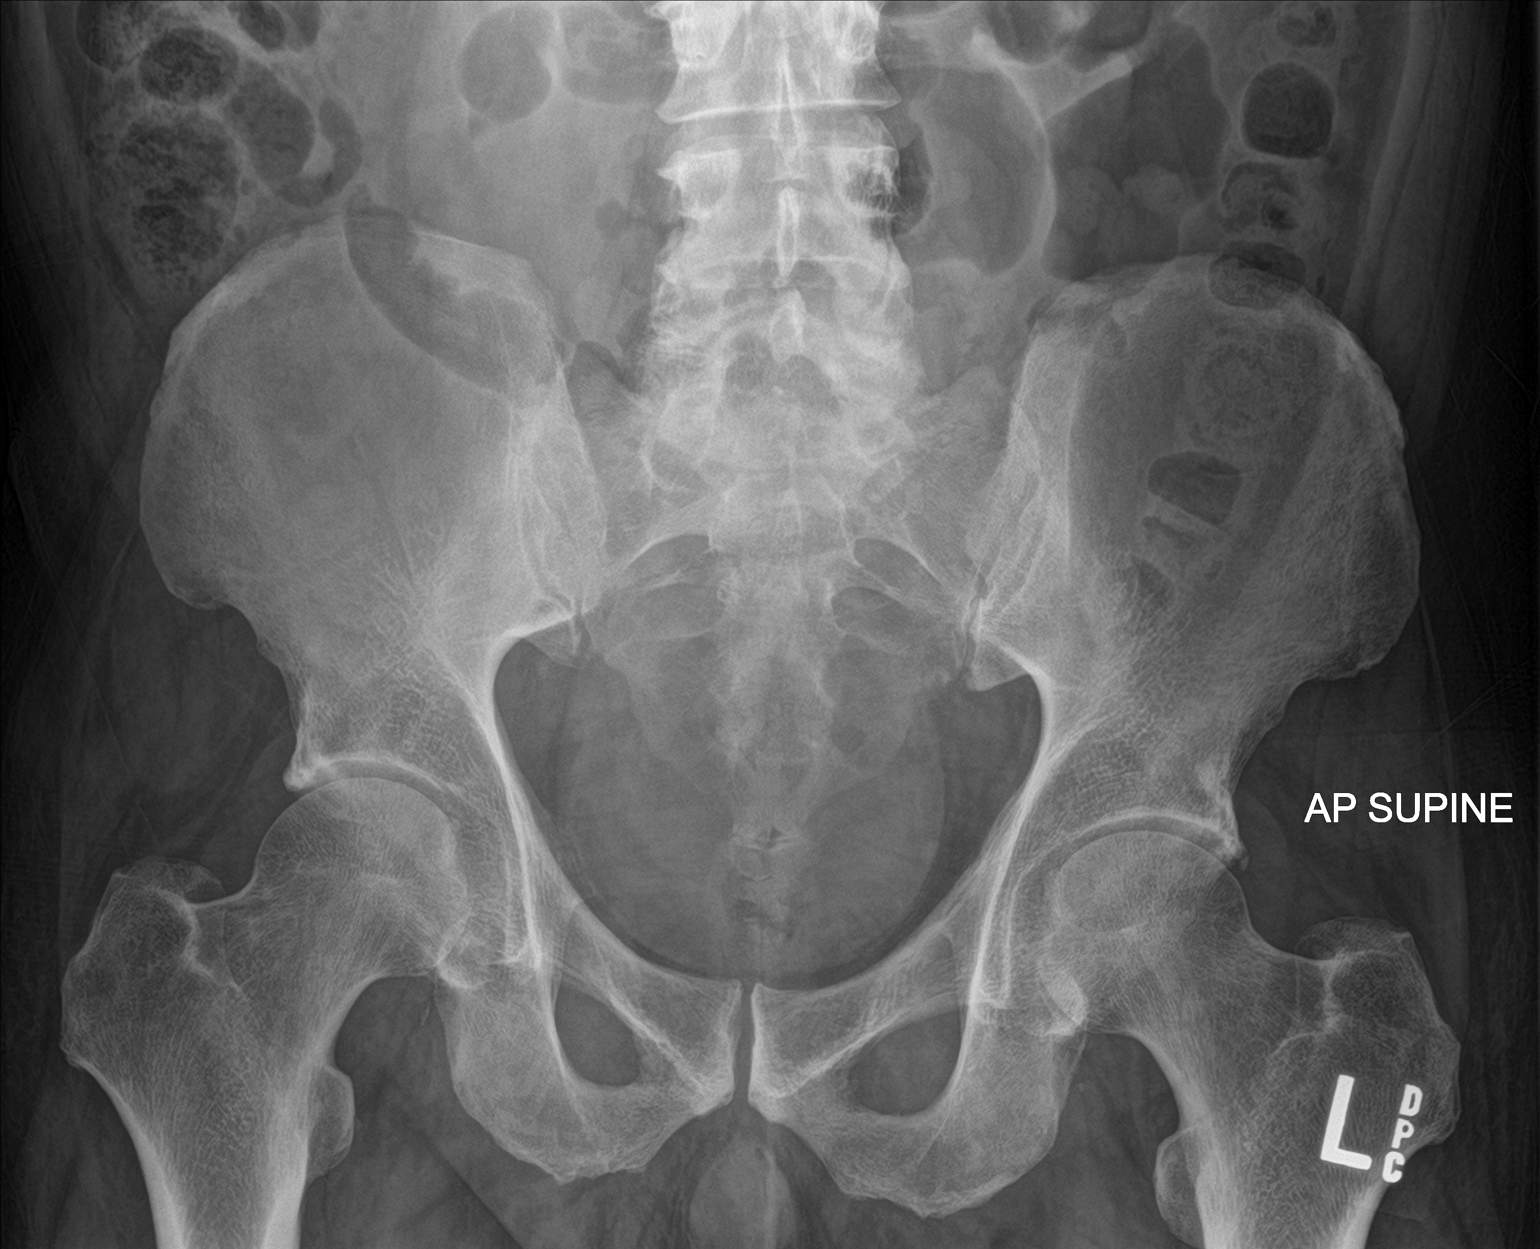

[hip ap]
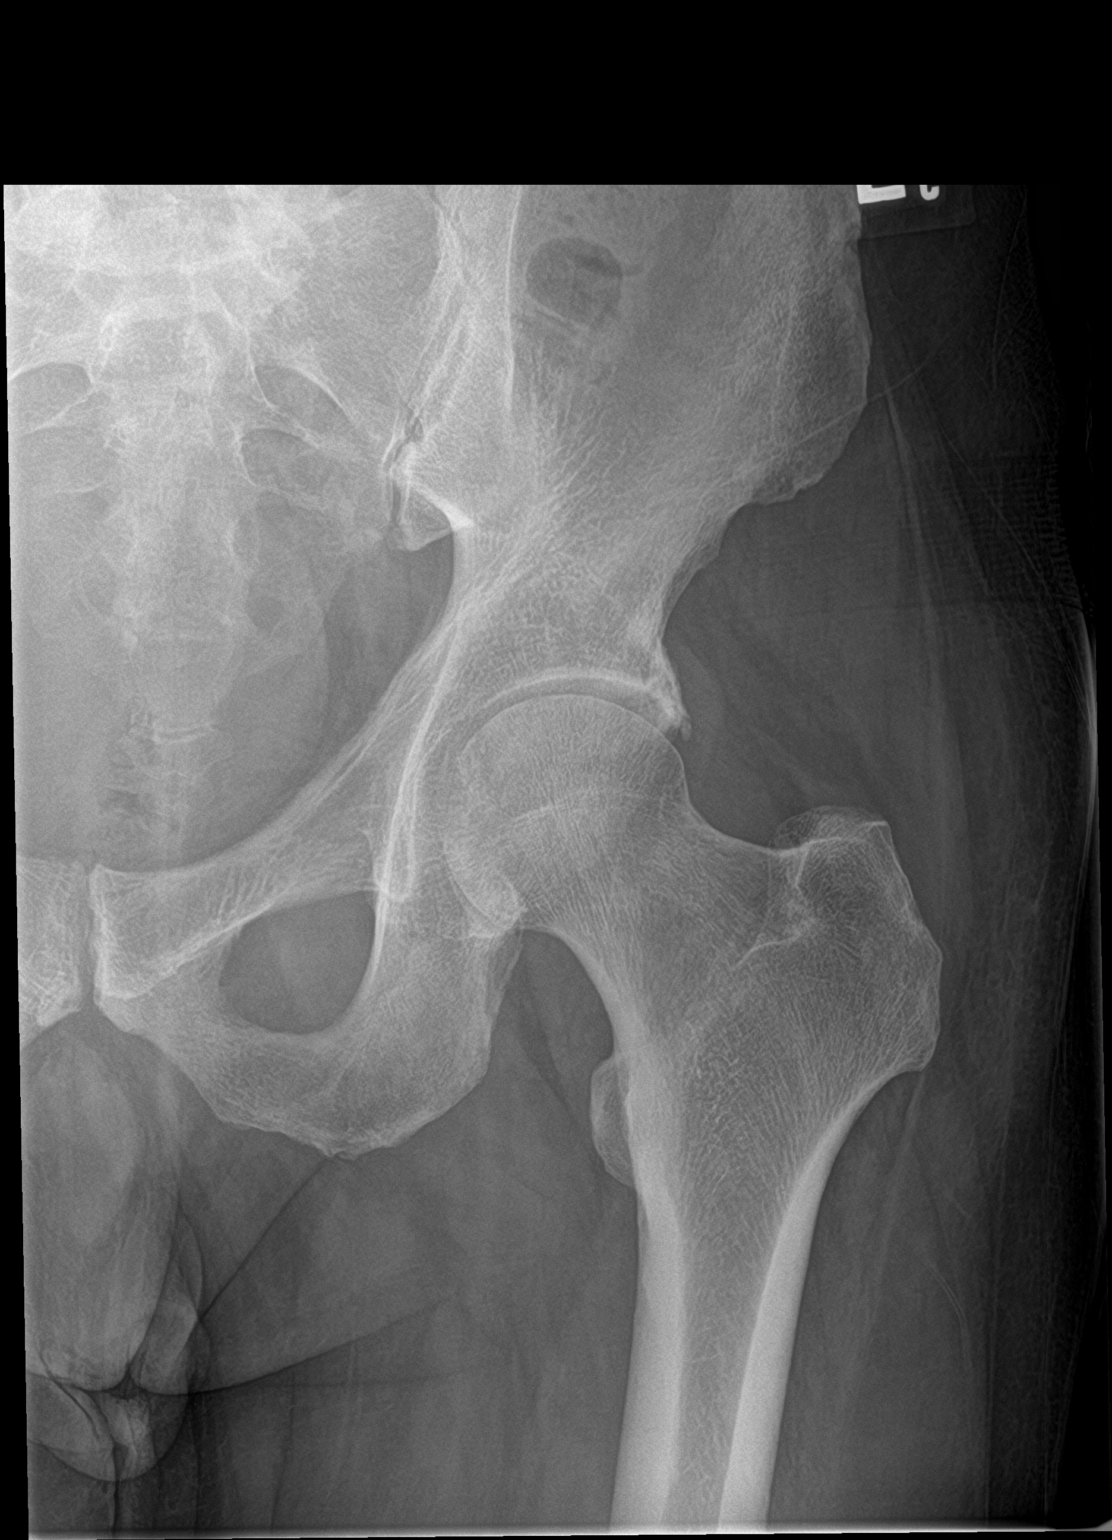

[hip lat]
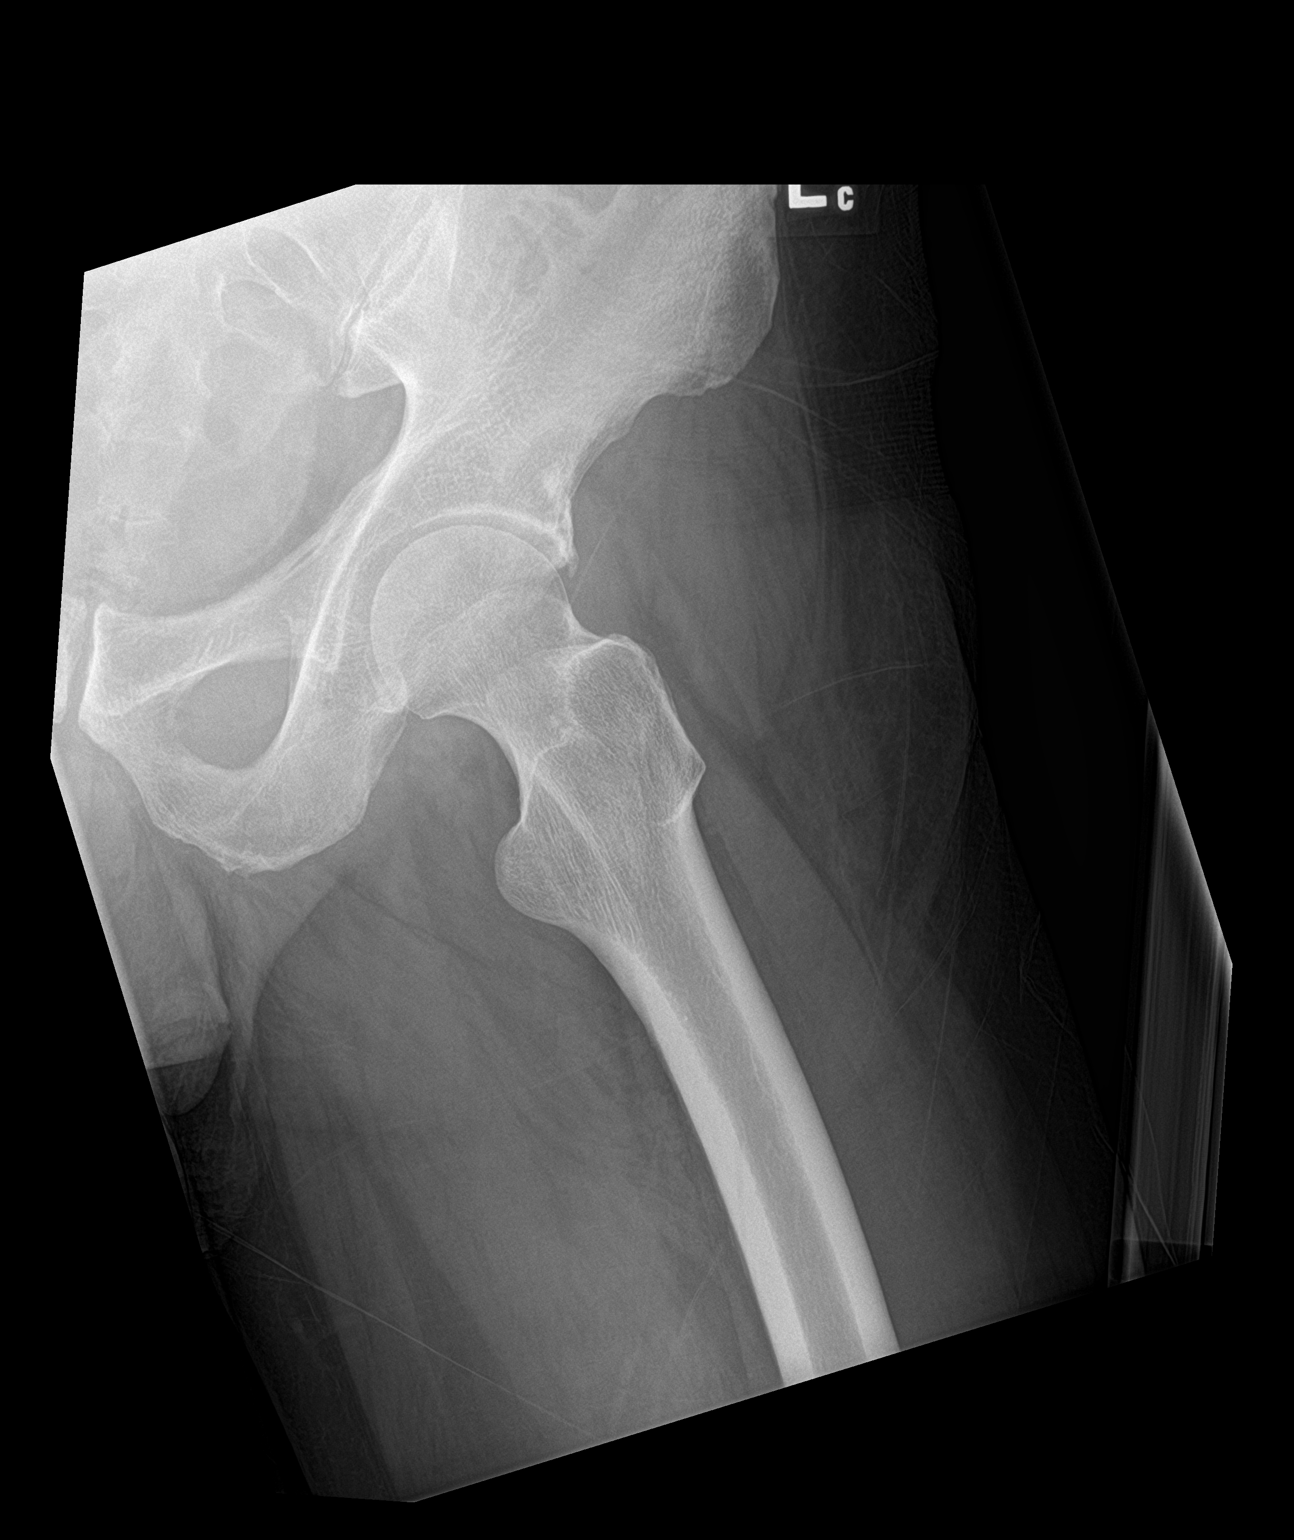

[3 of 3 positions shown; findings below may reference images not displayed]

FINDINGS: Lower lumbar facet arthrosis at L4-5 and L5-S1. Intact bony pelvis
without diastasis. Slight joint space narrowing of both hips. No
acute fracture is identified of either femora. Soft tissues are
unremarkable. Overlying bowel demonstrates average stool retention
within the colon.
IMPRESSION: 1. Lower lumbar degenerative facet arthrosis.
2. Mild degenerative joint space narrowing of the hips without acute
osseous abnormality.
3. Intact bony pelvis.

## 2020-11-07 IMAGING — DX DG SHOULDER 2+V*L*
3 series · 3 of 3 positions shown · non-contrast
Comparison: None.

CLINICAL DATA: Evaluate for shoulder fracture.  Fall.

EXAM:
LEFT SHOULDER - 2+ VIEW

[shoulder grashey]
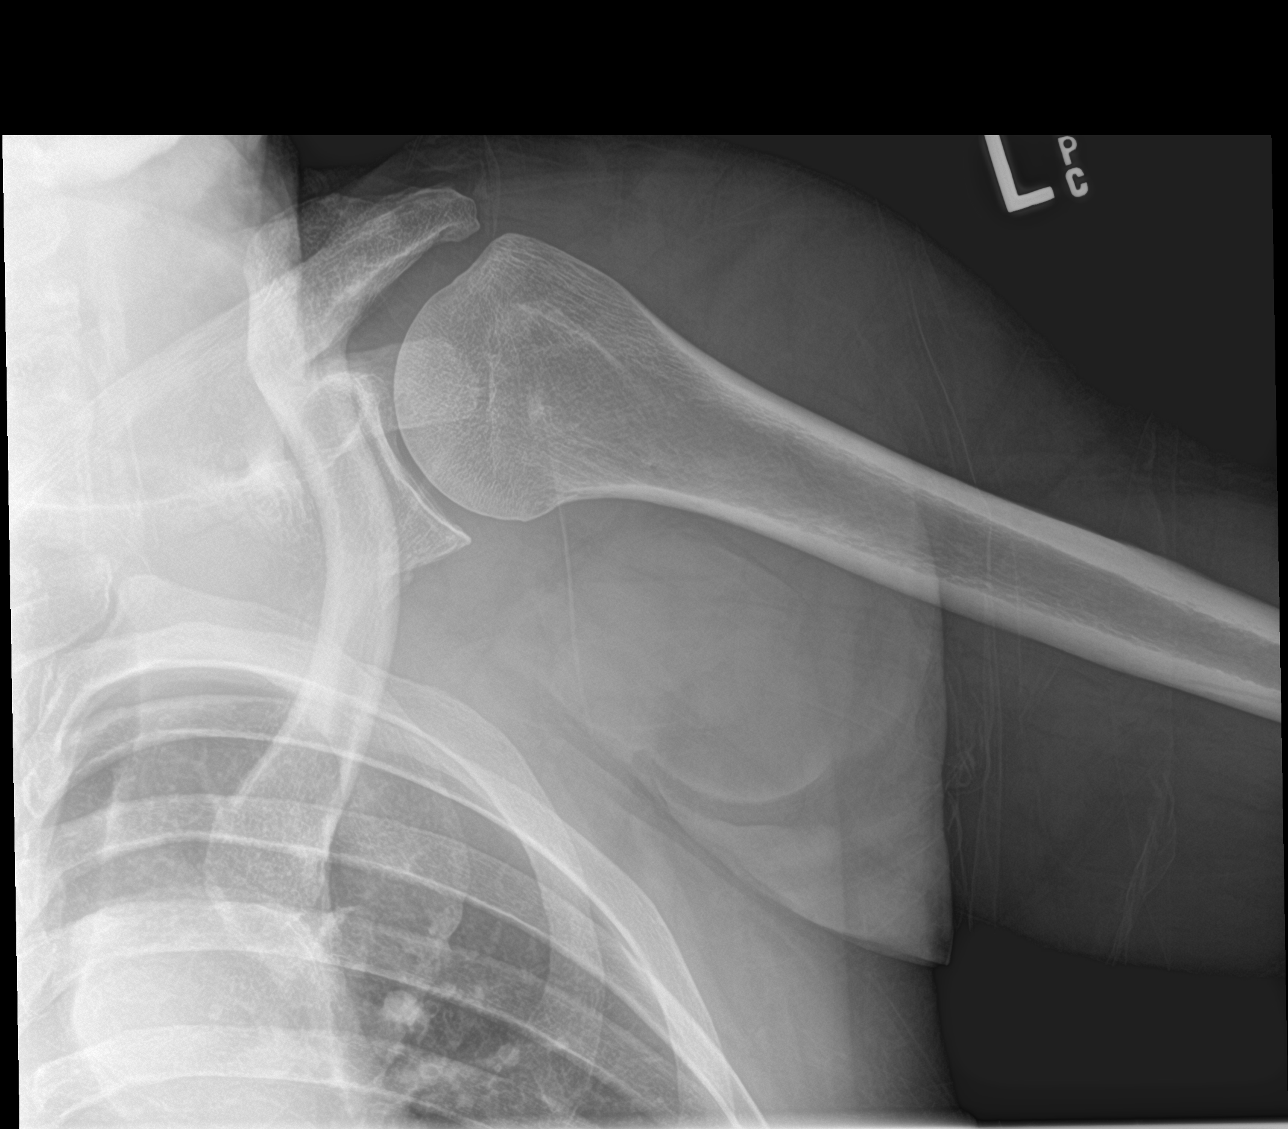

[shoulder axillary]
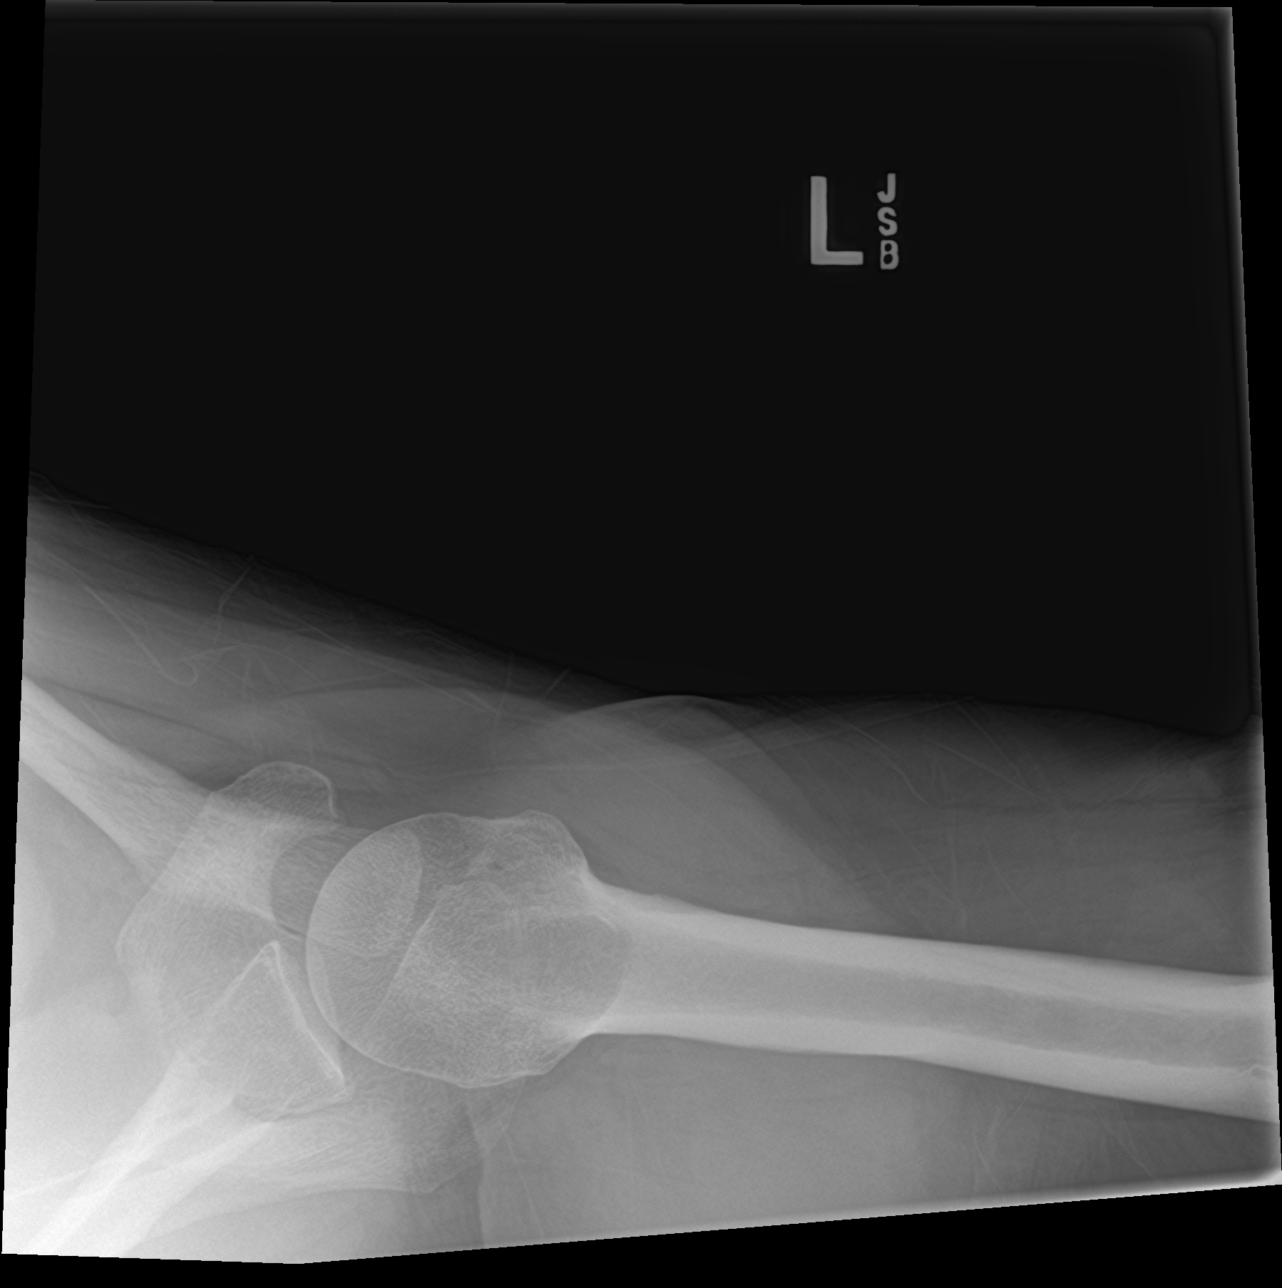

[shoulder y view]
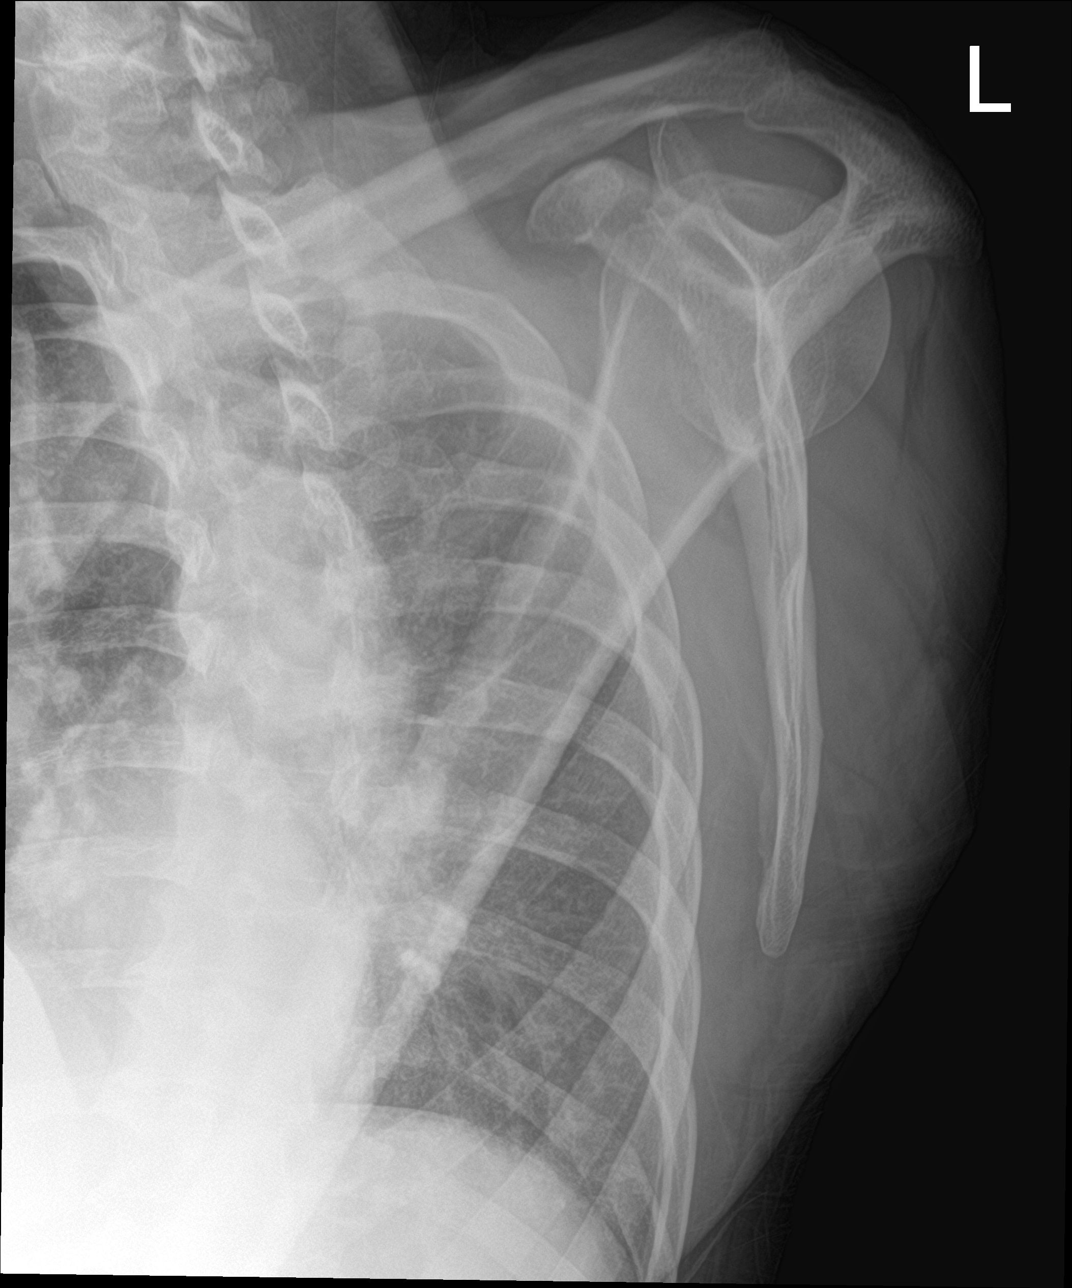

[3 of 3 positions shown; findings below may reference images not displayed]

FINDINGS: There is no evidence of fracture or dislocation. There is no
evidence of arthropathy or other focal bone abnormality. Soft
tissues are unremarkable.
IMPRESSION: Negative.

## 2020-11-12 DIAGNOSIS — G894 Chronic pain syndrome: Secondary | ICD-10-CM | POA: Diagnosis not present

## 2020-11-12 DIAGNOSIS — K219 Gastro-esophageal reflux disease without esophagitis: Secondary | ICD-10-CM | POA: Diagnosis not present

## 2020-11-12 DIAGNOSIS — I129 Hypertensive chronic kidney disease with stage 1 through stage 4 chronic kidney disease, or unspecified chronic kidney disease: Secondary | ICD-10-CM | POA: Diagnosis not present

## 2020-11-12 DIAGNOSIS — Z79891 Long term (current) use of opiate analgesic: Secondary | ICD-10-CM | POA: Diagnosis not present

## 2020-11-12 DIAGNOSIS — E039 Hypothyroidism, unspecified: Secondary | ICD-10-CM | POA: Diagnosis not present

## 2020-11-12 DIAGNOSIS — Z7409 Other reduced mobility: Secondary | ICD-10-CM | POA: Diagnosis not present

## 2020-11-12 DIAGNOSIS — G629 Polyneuropathy, unspecified: Secondary | ICD-10-CM | POA: Diagnosis not present

## 2020-11-12 DIAGNOSIS — E1142 Type 2 diabetes mellitus with diabetic polyneuropathy: Secondary | ICD-10-CM | POA: Diagnosis not present

## 2020-11-12 DIAGNOSIS — I480 Paroxysmal atrial fibrillation: Secondary | ICD-10-CM | POA: Diagnosis not present

## 2020-11-12 DIAGNOSIS — E782 Mixed hyperlipidemia: Secondary | ICD-10-CM | POA: Diagnosis not present

## 2020-11-12 DIAGNOSIS — R296 Repeated falls: Secondary | ICD-10-CM | POA: Diagnosis not present

## 2020-11-12 DIAGNOSIS — E1122 Type 2 diabetes mellitus with diabetic chronic kidney disease: Secondary | ICD-10-CM | POA: Diagnosis not present

## 2020-11-12 DIAGNOSIS — I341 Nonrheumatic mitral (valve) prolapse: Secondary | ICD-10-CM | POA: Diagnosis not present

## 2020-11-12 DIAGNOSIS — M4800 Spinal stenosis, site unspecified: Secondary | ICD-10-CM | POA: Diagnosis not present

## 2020-11-13 ENCOUNTER — Telehealth: Payer: Self-pay | Admitting: *Deleted

## 2020-11-13 ENCOUNTER — Encounter: Payer: Self-pay | Admitting: *Deleted

## 2020-11-13 NOTE — Telephone Encounter (Signed)
Per wife, saw PCP on yesterday and was told to see heart doctor as soon as possible for fluid in lungs and sob.  Per wife, reports patient has dizziness, chest pain and sob. Intermittent chest pain rated 3/10. Took nitroglycerin x's 2 on Monday and felt relief. Denies active chest pain, dizziness or sob. Reports having symptoms for that last couple of weeks that has gotten worse. Does not check BP or HR at home. Reports having dizziness today and laid down. Appointment given to see Nena Polio, NP tomorrow at 9:00 am. Advises if symptoms got worse, to go to the ED for an evaluation. Verbalized understanding of plan. Records requested from PCP

## 2020-11-14 ENCOUNTER — Ambulatory Visit (INDEPENDENT_AMBULATORY_CARE_PROVIDER_SITE_OTHER): Payer: Medicare Other | Admitting: Family Medicine

## 2020-11-14 ENCOUNTER — Telehealth: Payer: Self-pay | Admitting: Family Medicine

## 2020-11-14 ENCOUNTER — Other Ambulatory Visit: Payer: Self-pay

## 2020-11-14 ENCOUNTER — Encounter: Payer: Self-pay | Admitting: *Deleted

## 2020-11-14 ENCOUNTER — Encounter: Payer: Self-pay | Admitting: Family Medicine

## 2020-11-14 VITALS — BP 134/72 | HR 77 | Ht 70.0 in | Wt 186.8 lb

## 2020-11-14 DIAGNOSIS — R609 Edema, unspecified: Secondary | ICD-10-CM

## 2020-11-14 DIAGNOSIS — R079 Chest pain, unspecified: Secondary | ICD-10-CM | POA: Diagnosis not present

## 2020-11-14 DIAGNOSIS — I10 Essential (primary) hypertension: Secondary | ICD-10-CM | POA: Diagnosis not present

## 2020-11-14 DIAGNOSIS — R0602 Shortness of breath: Secondary | ICD-10-CM | POA: Diagnosis not present

## 2020-11-14 DIAGNOSIS — I4891 Unspecified atrial fibrillation: Secondary | ICD-10-CM | POA: Diagnosis not present

## 2020-11-14 NOTE — Patient Instructions (Signed)
Medication Instructions:  Continue all current medications.  Labwork: none  Testing/Procedures:  Your physician has requested that you have an echocardiogram. Echocardiography is a painless test that uses sound waves to create images of your heart. It provides your doctor with information about the size and shape of your heart and how well your heart's chambers and valves are working. This procedure takes approximately one hour. There are no restrictions for this procedure.  Your physician has requested that you have a lexiscan myoview. For further information please visit [email or web address]. Please follow instruction sheet, as given.  Office will contact with results via phone or letter.    Follow-Up: 4-6 weeks   Any Other Special Instructions Will Be Listed Below (If Applicable).  If you need a refill on your cardiac medications before your next appointment, please call your pharmacy.  

## 2020-11-14 NOTE — Telephone Encounter (Signed)
Pre-cert Verification for the following procedure    1.LEXISCAN 2.ECHO   DATE:12/02/2020  LOCATION: [facility]

## 2020-11-14 NOTE — Progress Notes (Addendum)
Cardiology Office Note  Date: 11/14/2020   ID: Ronald, Armstrong October 23, 1945, MRN [id number]  PCP:  Benita Stabile, MD  Cardiologist:  Dina Rich, MD Electrophysiologist:  Hillis Range, MD   Chief Complaint: Chest pain, shortness of breath  History of Present Illness: [name] is a 75 y.o. male with a history of chest pain, atrial fibrillation, bradycardia, orthostatic dizziness, pacemaker.  Last seen by Dr. Wyline Mood 08/12/2020.  History of PAF and frequent falls.  Since pacemaker placed falls had resolved and was started on Eliquis 5 mg p.o. twice daily.  Occasional palpitations no bleeding.  History of bradycardia with rates in the 40s to 50s.  07/23/2019 pacemaker [employer] surety dual-chamber placed by Dr. Johney Frame with normal device check with Dr. Johney Frame on May 2021. Had prior extensive work-up for chest pain.  Prior stress test in 2020 no ischemia.  Had been seen in the ER the prior prior day before 12/21/20221 visit and troponin's were negative x 2, EKG sinus rhythm no ischemic changes.  Orthostatic dizziness with rare symptoms.  Improved since stopping lisinopril.  Since chest pain wiht negative ER work-up, plans were to continue monitoring at that time in absence of progression or recurrence would not plan for repeat ischemic testing.  Atrial fibrillation was doing overall well, was continuing current meds.  He is here today with recent complaints of increasing shortness of breath and episodes of chest pain.  States that shortness of breath has been going on for over a month now.  Reports some increased weight and lower extremity edema.  Complains of chest pain at rest and with exertion.  States he had 2 episodes of chest pain relieved with nitroglycerin.  Denies any radiation to neck, arm, back, jaw.  States he sometimes has some nausea but no vomiting or diaphoresis.  He states his primary care provider recently started him on Lasix which he has not started yet.   Blood pressure today 134/72.  Currently denies any anginal symptoms, orthostatic symptoms, palpitations or arrhythmias, CVA or TIA-like symptoms, bleeding issues, claudication-like symptoms, DVT or PE-like symptoms.  EKG today shows normal sinus rhythm with a rate of 71   Past Medical History:  Diagnosis Date  . Alcohol use   . Anxiety   . Chest pain   . Coronary artery disease    a. Nonobstructive CAD by cath 2010 with negative nuclear stress test 05/2013.  . Depression   . Diabetes mellitus   . Hyperlipidemia   . Hypertension   . Hypothyroidism   . Kidney problem    a. 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  . Mild aortic insufficiency   . PAT (paroxysmal atrial tachycardia) (HCC)   . Pericardial effusion    a. by echo 2014.  . Pulmonary nodules    resolved  . PVC's (premature ventricular contractions)   . Stroke (cerebrum) (HCC)   . 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 Wide-complex tachycardia (HCC)    a. per [name] note: Event monitor September 2014 sinus rhythm, occasional PVCs, nonsustained PAT, and one 7 beat run of a wide QRS complex at 110-120 bpm, with minimal changes QRS axis, and QRS morphology not similar to documented PVCs.     Past Surgical History:  Procedure Laterality Date  . BACK SURGERY  1996  . BREAST LUMPECTOMY Left 1979  . CIRCUMCISION  1980  . MANDIBLE SURGERY Right 1969  .  PACEMAKER IMPLANT N/A 06/26/2019   Procedure: PACEMAKER IMPLANT;  Surgeon: Hillis RangeAllred, James, MD;  Location: [name];  Service: Cardiovascular;  Laterality: N/A;    Current Outpatient Medications  Medication Sig Dispense Refill  . acetaminophen (TYLENOL) 500 MG tablet Take 500 mg by mouth every 8 (eight) hours as needed for moderate pain or headache.    . ALPRAZolam (XANAX) 1 MG tablet Take 1 mg by mouth in the morning and at bedtime.  2  . apixaban (ELIQUIS) 5 MG TABS tablet Take 1 tablet (5 mg total) by mouth 2  (two) times daily. 60 tablet 6  . atorvastatin (LIPITOR) 20 MG tablet Take 20 mg by mouth daily.     Marland Kitchen. buPROPion (WELLBUTRIN SR) 100 MG 12 hr tablet Take 100 mg by mouth every morning.    . diphenhydrAMINE (BENADRYL) 25 MG tablet Take 25 mg by mouth daily as needed for allergies.    . furosemide (LASIX) 20 MG tablet Take 20 mg by mouth daily.    Marland Kitchen. gabapentin (NEURONTIN) 600 MG tablet Take 600 mg by mouth with breakfast, with lunch, and with evening meal.  1  . levothyroxine (SYNTHROID) 125 MCG tablet Take 125 mcg by mouth daily before breakfast.    . lisinopril (ZESTRIL) 2.5 MG tablet Take 2.5 mg by mouth in the morning.    . metFORMIN (GLUCOPHAGE) 500 MG tablet Take 1-2 tablets (500-1,000 mg total) by mouth See admin instructions. Take 1000 mg in the morning and 500 mg in the evening (Patient taking differently: Take 500-1,000 mg by mouth See admin instructions. Take 1,000 mg by mouth in the morning and 500 mg in the evening)    . nitroGLYCERIN (NITROSTAT) 0.4 MG SL tablet Place 0.4 mg under the tongue every 5 (five) minutes as needed for chest pain.     Marland Kitchen. omega-3 acid ethyl esters (LOVAZA) 1 g capsule Take 2 g by mouth 2 (two) times daily.  12  . oxyCODONE-acetaminophen (PERCOCET) 10-325 MG tablet Take 1 tablet by mouth every 4 (four) hours.  0   No current facility-administered medications for this visit.   Allergies:  Morphine, Tape, and Sulfonamide derivatives   Social History: The patient  reports that he quit smoking about 38 years ago. He has never used smokeless tobacco. He reports previous alcohol use. He reports that he does not use drugs.   Family History: The patient's family history includes Bladder Cancer in his father; Cancer in his maternal grandfather; Colon cancer in his brother; Diabetes in his father; Lung cancer in his mother; Stroke in his maternal grandmother, paternal grandfather, and paternal grandmother.   ROS:  Please see the history of present illness. Otherwise,  complete review of systems is positive for none.  All other systems are reviewed and negative.   Physical Exam: VS:  BP 134/72   Pulse 77   Ht 5\' 10"  (1.778 m)   Wt 186 lb 12.8 oz (84.7 kg)   SpO2 98%   BMI 26.80 kg/m , BMI Body mass index is 26.8 kg/m.  Wt Readings from Last 3 Encounters:  11/14/20 186 lb 12.8 oz (84.7 kg)  08/12/20 182 lb (82.6 kg)  08/11/20 175 lb (79.4 kg)    General: Patient appears comfortable at rest. Neck: Supple, no elevated JVP or carotid bruits, no thyromegaly. Lungs: Clear to auscultation, nonlabored breathing at rest. Cardiac: Regular rate and rhythm, no S3 or significant systolic murmur, no pericardial rub. Extremities: No pitting edema, distal pulses 2+. Skin: Warm  and dry. Musculoskeletal: No kyphosis. Neuropsychiatric: Alert and oriented x3, affect grossly appropriate.  ECG:  An ECG dated 11/14/2020 was personally reviewed today and demonstrated:  Normal sinus rhythm rate of 71.  Recent Labwork: 12/04/2019: TSH 1.217 08/11/2020: ALT 16; AST 20; BUN 15; Creatinine, Ser 1.34; Hemoglobin 12.5; Platelets 146; Potassium 4.3; Sodium 139     Component Value Date/Time   CHOL  08/31/2008 0615    189        ATP III CLASSIFICATION:  <200     mg/dL   Desirable  956-387  mg/dL   Borderline High  >=564    mg/dL   High          TRIG 332 (H) 08/31/2008 0615   HDL 26 (L) 08/31/2008 0615   CHOLHDL 7.3 08/31/2008 0615   VLDL 35 08/31/2008 0615   LDLCALC (H) 08/31/2008 0615    128        Total Cholesterol/HDL:CHD Risk Coronary Heart Disease Risk Table                     Men   Women  1/2 Average Risk   3.4   3.3  Average Risk       5.0   4.4  2 X Average Risk   9.6   7.1  3 X Average Risk  23.4   11.0        Use the calculated Patient Ratio above and the CHD Risk Table to determine the patient's CHD Risk.        ATP III CLASSIFICATION (LDL):  <100     mg/dL   Optimal  951-884  mg/dL   Near or Above                    Optimal  130-159  mg/dL    Borderline  166-063  mg/dL   High  >016     mg/dL   Very High    Other Studies Reviewed Today: 01/2019 event monitor  7 day event monitor. Date is available from 83% of planned monitored time  Min HR 52, Max HR 111, Avg HR 65  Sinus rhythm with episodes of aflutter with variable conduction, no significant RVR. Can have postconversion pause up to 3.2 seconds.  No symptoms reported   10/2018 echo IMPRESSIONS    1. The left ventricle has normal systolic function with an ejection  fraction of 60-65%. The cavity size was normal. There is mild concentric  left ventricular hypertrophy. Left ventricular diastolic Doppler  parameters are consistent with impaired  relaxation. Indeterminate filling pressures No evidence of left  ventricular regional wall motion abnormalities.  2. The right ventricle has normal systolic function. The cavity was  normal. There is no increase in right ventricular wall thickness.  3. The tricuspid valve is grossly normal.  4. The aortic valve is tricuspid Aortic valve regurgitation is mild by  color flow Doppler.  5. There is mild dilatation of the aortic root measuring 38 mm.  6. The interatrial septum was not well visualized.   04/2019 nuclear stress  There was no ST segment deviation noted during stress.  Findings consistent with prior inferior/inferoseptal/inferoapical myocardial infarction. No current ischemia  This is a low risk study.  The left ventricular ejection fraction is normal (55-65%).    Assessment and Plan:  1. Chest pain of uncertain etiology   2. SOB (shortness of breath)   3. Swelling   4. Atrial fibrillation, unspecified type (  HCC)   5. Essential hypertension    1. Chest pain of uncertain etiology Patient recently complaining of chest pain occurring mostly at rest but sometimes with exertion.  States he has taken sublingual nitroglycerin with relief on at least 2 occasions.  He denies radiation to neck, arm,  back or jaw.  Denies any vomiting or diaphoresis.  States he has occasional nausea associated.  Please order a Lexiscan stress test.  Not on aspirin due to systemic anticoagulation for atrial fibrillation.  Continue atorvastatin 20 mg daily.  Continue sublingual nitroglycerin as needed.  2. SOB (shortness of breath) Complains of increased dyspnea on exertion.  States has been going on for a month or so.  Please get a repeat echocardiogram.  3. Swelling Recently complaining of lower extremity swelling and increased weight gain.  Weight today is 186. PCP recently started Lasix 20 mg daily which patient has not started yet.  4.  Atrial fibrillation. Denies any palpitations or arrhythmias.  EKG today shows normal sinus rhythm with a rate of 71.  Continue Eliquis 5 mg p.o. twice daily.  Has indwelling pacemaker.  Recent remote device check on 08/22/2020 by Dr. Johney Frame was normal.  5.  Essential hypertension Blood pressure reasonably controlled at 134/72.  Continue lisinopril 2.5 mg daily.  Continue Lasix 20 mg daily.  Medication Adjustments/Labs and Tests Ordered: Current medicines are reviewed at length with the patient today.  Concerns regarding medicines are outlined above.   Disposition: Follow-up with Dr. Wyline Mood in 4 to 6 weeks.  Signed, Rennis Harding, NP 11/14/2020 9:58 AM    [facility] at [facility] [address] [place], [place], [place] [address] Phone: [phone]; Fax: [phone]

## 2020-11-19 DIAGNOSIS — I10 Essential (primary) hypertension: Secondary | ICD-10-CM | POA: Diagnosis not present

## 2020-11-19 DIAGNOSIS — K219 Gastro-esophageal reflux disease without esophagitis: Secondary | ICD-10-CM | POA: Diagnosis not present

## 2020-11-19 DIAGNOSIS — R296 Repeated falls: Secondary | ICD-10-CM | POA: Diagnosis not present

## 2020-11-19 DIAGNOSIS — G8929 Other chronic pain: Secondary | ICD-10-CM | POA: Diagnosis not present

## 2020-11-19 DIAGNOSIS — E782 Mixed hyperlipidemia: Secondary | ICD-10-CM | POA: Diagnosis not present

## 2020-11-19 DIAGNOSIS — G9009 Other idiopathic peripheral autonomic neuropathy: Secondary | ICD-10-CM | POA: Diagnosis not present

## 2020-11-19 DIAGNOSIS — Z0000 Encounter for general adult medical examination without abnormal findings: Secondary | ICD-10-CM | POA: Diagnosis not present

## 2020-11-19 DIAGNOSIS — R1013 Epigastric pain: Secondary | ICD-10-CM | POA: Diagnosis not present

## 2020-11-19 DIAGNOSIS — E119 Type 2 diabetes mellitus without complications: Secondary | ICD-10-CM | POA: Diagnosis not present

## 2020-11-19 DIAGNOSIS — R079 Chest pain, unspecified: Secondary | ICD-10-CM | POA: Diagnosis not present

## 2020-11-20 ENCOUNTER — Other Ambulatory Visit: Payer: Self-pay

## 2020-11-20 ENCOUNTER — Telehealth: Payer: Self-pay | Admitting: Emergency Medicine

## 2020-11-20 ENCOUNTER — Ambulatory Visit (INDEPENDENT_AMBULATORY_CARE_PROVIDER_SITE_OTHER): Payer: Medicare Other

## 2020-11-20 DIAGNOSIS — M4800 Spinal stenosis, site unspecified: Secondary | ICD-10-CM | POA: Diagnosis not present

## 2020-11-20 DIAGNOSIS — Z7409 Other reduced mobility: Secondary | ICD-10-CM | POA: Diagnosis not present

## 2020-11-20 DIAGNOSIS — R296 Repeated falls: Secondary | ICD-10-CM | POA: Diagnosis not present

## 2020-11-20 DIAGNOSIS — Z950 Presence of cardiac pacemaker: Secondary | ICD-10-CM | POA: Diagnosis not present

## 2020-11-20 DIAGNOSIS — I4891 Unspecified atrial fibrillation: Secondary | ICD-10-CM | POA: Diagnosis not present

## 2020-11-20 DIAGNOSIS — I480 Paroxysmal atrial fibrillation: Secondary | ICD-10-CM | POA: Diagnosis not present

## 2020-11-20 DIAGNOSIS — R600 Localized edema: Secondary | ICD-10-CM | POA: Diagnosis not present

## 2020-11-20 DIAGNOSIS — I129 Hypertensive chronic kidney disease with stage 1 through stage 4 chronic kidney disease, or unspecified chronic kidney disease: Secondary | ICD-10-CM | POA: Diagnosis not present

## 2020-11-20 DIAGNOSIS — R1010 Upper abdominal pain, unspecified: Secondary | ICD-10-CM | POA: Diagnosis not present

## 2020-11-20 LAB — CUP PACEART REMOTE DEVICE CHECK
Battery Remaining Longevity: 120 mo
Battery Remaining Percentage: 95.5 %
Battery Voltage: 3.01 V
Brady Statistic AP VP Percent: 1 %
Brady Statistic AP VS Percent: 2.5 %
Brady Statistic AS VP Percent: 1 %
Brady Statistic AS VS Percent: 97 %
Brady Statistic RA Percent Paced: 2.1 %
Brady Statistic RV Percent Paced: 1.4 %
Date Time Interrogation Session: 20220331020014
Implantable Lead Implant Date: 20201103
Implantable Lead Implant Date: 20201103
Implantable Lead Location: 753859
Implantable Lead Location: 753860
Implantable Pulse Generator Implant Date: 20201103
Lead Channel Impedance Value: 390 Ohm
Lead Channel Impedance Value: 540 Ohm
Lead Channel Pacing Threshold Amplitude: 0.5 V
Lead Channel Pacing Threshold Amplitude: 0.75 V
Lead Channel Pacing Threshold Pulse Width: 0.5 ms
Lead Channel Pacing Threshold Pulse Width: 0.5 ms
Lead Channel Sensing Intrinsic Amplitude: 0.9 mV
Lead Channel Sensing Intrinsic Amplitude: 12 mV
Lead Channel Setting Pacing Amplitude: 2 V
Lead Channel Setting Pacing Amplitude: 2.5 V
Lead Channel Setting Pacing Pulse Width: 0.5 ms
Lead Channel Setting Sensing Sensitivity: 2 mV
Pulse Gen Model: 2272
Pulse Gen Serial Number: 9174806

## 2020-11-20 NOTE — Telephone Encounter (Signed)
Attempted to contact patient for a [employer] received on 11/20/20. Patient known PAF and is now showing persistent AF. AT/AF burden 12%. Longest event 23.5 hours. Unable to reach patient for assessment. Patient's mailbox full. Routing to Dr. Johney Frame for review. Patient prescribed Eliquis 5 mg for OAC.

## 2020-11-27 ENCOUNTER — Ambulatory Visit: Payer: Medicare Other | Admitting: Family Medicine

## 2020-12-02 ENCOUNTER — Encounter (HOSPITAL_BASED_OUTPATIENT_CLINIC_OR_DEPARTMENT_OTHER)
Admission: RE | Admit: 2020-12-02 | Discharge: 2020-12-02 | Disposition: A | Discharge status: Home / Self Care | Payer: Medicare Other | Source: Ambulatory Visit | Attending: Family Medicine | Admitting: Family Medicine

## 2020-12-02 ENCOUNTER — Other Ambulatory Visit: Payer: Self-pay

## 2020-12-02 ENCOUNTER — Ambulatory Visit (HOSPITAL_COMMUNITY)
Admission: RE | Admit: 2020-12-02 | Discharge: 2020-12-02 | Disposition: A | Discharge status: Home / Self Care | Payer: Medicare Other | Source: Ambulatory Visit | Attending: Family Medicine | Admitting: Family Medicine

## 2020-12-02 DIAGNOSIS — R079 Chest pain, unspecified: Secondary | ICD-10-CM | POA: Insufficient documentation

## 2020-12-02 DIAGNOSIS — R600 Localized edema: Secondary | ICD-10-CM | POA: Diagnosis not present

## 2020-12-02 DIAGNOSIS — R609 Edema, unspecified: Secondary | ICD-10-CM | POA: Insufficient documentation

## 2020-12-02 DIAGNOSIS — R0602 Shortness of breath: Secondary | ICD-10-CM

## 2020-12-02 LAB — ECHOCARDIOGRAM COMPLETE
AR max vel: 2.1 cm2
AV Area VTI: 2.15 cm2
AV Area mean vel: 1.78 cm2
AV Mean grad: 3.5 mmHg
AV Peak grad: 6.5 mmHg
Ao pk vel: 1.28 m/s
Area-P 1/2: 4.17 cm2
P 1/2 time: 557 msec
S' Lateral: 2.89 cm

## 2020-12-02 LAB — NM MYOCAR MULTI W/SPECT W/WALL MOTION / EF
LV dias vol: 97 mL (ref 62–150)
LV sys vol: 43 mL
Peak HR: 87 {beats}/min
RATE: 0.29
Rest HR: 62 {beats}/min
SDS: 4
SRS: 1
SSS: 5
TID: 1.01

## 2020-12-02 MED ORDER — REGADENOSON 0.4 MG/5ML IV SOLN
INTRAVENOUS | Status: AC
  Administered 2020-12-02: 0.4 mg via INTRAVENOUS
  Filled 2020-12-02: qty 5

## 2020-12-02 MED ORDER — SODIUM CHLORIDE FLUSH 0.9 % IV SOLN
INTRAVENOUS | Status: AC
  Administered 2020-12-02: 10 mL via INTRAVENOUS
  Filled 2020-12-02: qty 10

## 2020-12-02 MED ORDER — TECHNETIUM TC 99M TETROFOSMIN IV KIT
30.0000 | PACK | Freq: Once | INTRAVENOUS | Status: AC | PRN
  Administered 2020-12-02: 29.2 via INTRAVENOUS

## 2020-12-02 MED ORDER — TECHNETIUM TC 99M TETROFOSMIN IV KIT
10.0000 | PACK | Freq: Once | INTRAVENOUS | Status: AC | PRN
  Administered 2020-12-02: 9.9 via INTRAVENOUS

## 2020-12-02 NOTE — Progress Notes (Signed)
*  PRELIMINARY RESULTS* Echocardiogram 2D Echocardiogram has been performed.  [name] 12/02/2020, 12:50 PM

## 2020-12-03 NOTE — Telephone Encounter (Signed)
Patient contacted and reports he has SOB with activity, fatigue and increased pedal edema x 3 weeks. Patient reports CP and chest pressure relieved by Nitro sl x 1. Patient was seen by Vincenza Hews NP on 11/14/20, who ordered an echo and myocardial perfusion study that was performed 12/02/20. Remote transmission sent today that showed presenting EGM AS/Vs 76 bpm, device function WNL,  AMS episode 11/28/20 with EGM that showed AF with RVR that lasted 11 hours, 21 minutes, 38 seconds., + Eliquis, AT/AF burden 11%. ED precautions given .

## 2020-12-03 NOTE — Progress Notes (Signed)
Remote pacemaker transmission.   

## 2020-12-04 ENCOUNTER — Telehealth: Payer: Self-pay | Admitting: *Deleted

## 2020-12-04 MED ORDER — ISOSORBIDE MONONITRATE ER 30 MG PO TB24: 15.0000 mg | ORAL_TABLET | Freq: Every day | ORAL | 6 refills | Status: DC

## 2020-12-04 NOTE — Progress Notes (Signed)
Ok thank You.

## 2020-12-04 NOTE — Telephone Encounter (Signed)
Lesle Chris, LPN  7/78/2423 5:36 AM EDT Back to Top     Patient notified and verbalized understanding. Copy to pcp. Will send new medication to [facility] now. Moved f/u out to 01/02/21 from 12/12/20.    Netta Neat., NP  12/04/2020 8:48 AM EDT      Inocencio Homes , Please tell patient we are going to start him on Imdur 15 mg po daily and have him follow up in a month to reassess. Thanks   Antoine Poche, MD  12/04/2020 8:36 AM EDT      I recall looking at this study, mild to moderate apical ischemia low to intermediate risk. For these patients typically initially try medical management, would try adding imdur 15mg  daily and arranging a f/u. If symptoms refractrory to addition and further titration of antianginals consider cath then  MD   Dominga Ferry., NP  12/03/2020 6:32 PM EDT      Please call the patient and let him know the stres test showed evidence of previous heart attack. This was present on previous stress test. This test was interpreted as an intermediate risk study. Let him know I am sending this to Dr 12/05/2020 for further review given his recent symptoms. Tell him we will get back to him when I consult Dr Wyline Mood on next steps.

## 2020-12-12 ENCOUNTER — Ambulatory Visit: Payer: Medicare Other | Admitting: Family Medicine

## 2020-12-22 ENCOUNTER — Other Ambulatory Visit: Payer: Self-pay | Admitting: Cardiology

## 2020-12-22 DIAGNOSIS — I480 Paroxysmal atrial fibrillation: Secondary | ICD-10-CM

## 2020-12-22 NOTE — Telephone Encounter (Signed)
Prescription refill request for Eliquis received. Indication: Atrial Fib Last office visit: 11/14/20 Scr: 1.27 on 11/07/20 Age:  75 Weight: 84.7kg  Based on above findings Eliquis 5mg  twice daily is the appropriate dose.  Refill approved.

## 2020-12-26 ENCOUNTER — Encounter: Payer: Medicare Other | Admitting: Internal Medicine

## 2021-01-01 NOTE — Progress Notes (Signed)
Cardiology Office Note  Date: 01/02/2021   ID: [name], DOB May 17, 1946, MRN [id number]  PCP:  Benita Stabile, MD  Cardiologist:  Dina Rich, MD Electrophysiologist:  Hillis Range, MD   Chief Complaint: Follow-up stress test and echocardiogram  History of Present Illness: [name] is a 75 y.o. male with a history of chest pain, atrial fibrillation, bradycardia, orthostatic dizziness, pacemaker.  Last seen by Dr. Wyline Mood 08/12/2020.  History of PAF and frequent falls.  Since pacemaker placed falls had resolved and was started on Eliquis 5 mg p.o. twice daily.  Occasional palpitations no bleeding.  History of bradycardia with rates in the 40s to 50s.  07/23/2019 pacemaker [employer] surety dual-chamber placed by Dr. Johney Frame with normal device check with Dr. Johney Frame on May 2021. Had prior extensive work-up for chest pain.  Prior stress test in 2020 no ischemia.  Had been seen in the ER the prior prior day before 12/21/20221 visit and troponin's were negative x 2, EKG sinus rhythm no ischemic changes.  Orthostatic dizziness with rare symptoms.  Improved since stopping lisinopril.  Since chest pain wiht negative ER work-up, plans were to continue monitoring at that time in absence of progression or recurrence would not plan for repeat ischemic testing.  Atrial fibrillation was doing overall well, was continuing current meds.   He was here at last visit for complaints of increasing shortness of breath and episodes of chest pain.  Stated the shortness of breath has been going on for over a month.  Reported increased weight and lower extremity edema.  Complained chest pain at rest and with exertion.  He had 2 episodes of chest pain relieved with nitroglycerin.  Denied any radiation to neck, arm, back, jaw.  He sometimes had nausea but no vomiting or diaphoresis.  He is primary care provider recently started him on Lasix which he has not started.   We ordered a stress test  and echocardiogram at last visit.  He is here today for follow-up.  He denies any chest pain in the interim since last visit.  Denies any shortness of breath in the interim since last visit.  Denies any nausea since last visit.  Blood pressures well controlled today at 120/60.  Heart rate 72.  Denies any orthostatic symptoms, CVA or TIA-like symptoms, PND, orthopnea, palpitations or arrhythmias, bleeding.  Denies any claudication-like symptoms, DVT or PE-like symptoms.  Echocardiogram demonstrated EF of 55 to 60%.  No WMA's.  Mild LVH, G1 DD.  Circumferential pericardial effusion.  Mild AR.  Nuclear stress test demonstrated no ST deviation during stress.  Findings consistent with prior inferior/inferior septal myocardial infarction.  Small apical infarct with mild to moderate peri-infarct ischemia.  EF calculated at 55 to 65%.  Interpreted as low to intermediate risk.   Past Medical History:  Diagnosis Date  . Alcohol use   . Anxiety   . Chest pain   . Coronary artery disease    a. Nonobstructive CAD by cath 2010 with negative nuclear stress test 05/2013.  . Depression   . Diabetes mellitus   . Hyperlipidemia   . Hypertension   . Hypothyroidism   . Kidney problem    a. 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  . Mild aortic insufficiency   . PAT (paroxysmal atrial tachycardia) (HCC)   . Pericardial effusion    a. by echo 2014.  . Pulmonary nodules    resolved  . PVC's (premature ventricular contractions)   .  Stroke (cerebrum) (HCC)   . 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 Wide-complex tachycardia (HCC)    a. per [name] note: Event monitor September 2014 sinus rhythm, occasional PVCs, nonsustained PAT, and one 7 beat run of a wide QRS complex at 110-120 bpm, with minimal changes QRS axis, and QRS morphology not similar to documented PVCs.     Past Surgical History:  Procedure Laterality Date  . BACK SURGERY  1996  . BREAST  LUMPECTOMY Left 1979  . CIRCUMCISION  1980  . MANDIBLE SURGERY Right 1969  . PACEMAKER IMPLANT N/A 06/26/2019   Procedure: PACEMAKER IMPLANT;  Surgeon: Hillis Range, MD;  Location: [name];  Service: Cardiovascular;  Laterality: N/A;    Current Outpatient Medications  Medication Sig Dispense Refill  . acetaminophen (TYLENOL) 500 MG tablet Take 500 mg by mouth every 8 (eight) hours as needed for moderate pain or headache.    . ALPRAZolam (XANAX) 1 MG tablet Take 1 mg by mouth in the morning and at bedtime.  2  . atorvastatin (LIPITOR) 20 MG tablet Take 20 mg by mouth daily.     Marland Kitchen buPROPion (WELLBUTRIN SR) 100 MG 12 hr tablet Take 100 mg by mouth every morning.    . diphenhydrAMINE (BENADRYL) 25 MG tablet Take 25 mg by mouth daily as needed for allergies.    Marland Kitchen ELIQUIS 5 MG TABS tablet TAKE 1 TABLET BY MOUTH TWICE DAILY BEFORE BREAKFAST AND EVERY EVENING 60 tablet 5  . furosemide (LASIX) 20 MG tablet Take 20 mg by mouth daily.    Marland Kitchen gabapentin (NEURONTIN) 600 MG tablet Take 600 mg by mouth with breakfast, with lunch, and with evening meal.  1  . isosorbide mononitrate (IMDUR) 30 MG 24 hr tablet Take 0.5 tablets (15 mg total) by mouth daily. 15 tablet 6  . levothyroxine (SYNTHROID) 125 MCG tablet Take 125 mcg by mouth daily before breakfast.    . lisinopril (ZESTRIL) 2.5 MG tablet Take 2.5 mg by mouth in the morning.    . metFORMIN (GLUCOPHAGE) 500 MG tablet Take 1-2 tablets (500-1,000 mg total) by mouth See admin instructions. Take 1000 mg in the morning and 500 mg in the evening (Patient taking differently: Take 500-1,000 mg by mouth See admin instructions. Take 1,000 mg by mouth in the morning and 500 mg in the evening)    . nitroGLYCERIN (NITROSTAT) 0.4 MG SL tablet Place 0.4 mg under the tongue every 5 (five) minutes as needed for chest pain.     Marland Kitchen omega-3 acid ethyl esters (LOVAZA) 1 g capsule Take 2 g by mouth 2 (two) times daily.  12  . oxyCODONE-acetaminophen (PERCOCET) 10-325  MG tablet Take 1 tablet by mouth every 4 (four) hours.  0   No current facility-administered medications for this visit.   Allergies:  Morphine, Tape, and Sulfonamide derivatives   Social History: The patient  reports that he quit smoking about 38 years ago. He has never used smokeless tobacco. He reports previous alcohol use. He reports that he does not use drugs.   Family History: The patient's family history includes Bladder Cancer in his father; Cancer in his maternal grandfather; Colon cancer in his brother; Diabetes in his father; Lung cancer in his mother; Stroke in his maternal grandmother, paternal grandfather, and paternal grandmother.   ROS:  Please see the history of present illness. Otherwise, complete review of systems is positive for none.  All other  systems are reviewed and negative.   Physical Exam: VS:  BP 120/60   Pulse 72   Ht 5\' 9"  (1.753 m)   Wt 184 lb 12.8 oz (83.8 kg)   SpO2 95%   BMI 27.29 kg/m , BMI Body mass index is 27.29 kg/m.  Wt Readings from Last 3 Encounters:  01/02/21 184 lb 12.8 oz (83.8 kg)  11/14/20 186 lb 12.8 oz (84.7 kg)  08/12/20 182 lb (82.6 kg)    General: Patient appears comfortable at rest. Neck: Supple, no elevated JVP or carotid bruits, no thyromegaly. Lungs: Clear to auscultation, nonlabored breathing at rest. Cardiac: Regular rate and rhythm, no S3 or significant systolic murmur, no pericardial rub. Extremities: No pitting edema, distal pulses 2+. Skin: Warm and dry. Musculoskeletal: No kyphosis. Neuropsychiatric: Alert and oriented x3, affect grossly appropriate.  ECG:  An ECG dated 11/14/2020 was personally reviewed today and demonstrated:  Normal sinus rhythm rate of 71.  Recent Labwork: 08/11/2020: ALT 16; AST 20; BUN 15; Creatinine, Ser 1.34; Hemoglobin 12.5; Platelets 146; Potassium 4.3; Sodium 139     Component Value Date/Time   CHOL  08/31/2008 0615    189        ATP III CLASSIFICATION:  <200     mg/dL   Desirable   [id number]  mg/dL   Borderline High  >=045>=240    mg/dL   High          TRIG 409175 (H) 08/31/2008 0615   HDL 26 (L) 08/31/2008 0615   CHOLHDL 7.3 08/31/2008 0615   VLDL 35 08/31/2008 0615   LDLCALC (H) 08/31/2008 0615    128        Total Cholesterol/HDL:CHD Risk Coronary Heart Disease Risk Table                     Men   Women  1/2 Average Risk   3.4   3.3  Average Risk       5.0   4.4  2 X Average Risk   9.6   7.1  3 X Average Risk  23.4   11.0        Use the calculated Patient Ratio above and the CHD Risk Table to determine the patient's CHD Risk.        ATP III CLASSIFICATION (LDL):  <100     mg/dL   Optimal  [id number]  mg/dL   Near or Above                    Optimal  130-159  mg/dL   Borderline  [id number]  mg/dL   High  >213>190     mg/dL   Very High    Other Studies Reviewed Today:   Nuclear stress test 12/02/2020 Study Result  Narrative & Impression   There was no ST segment deviation noted during stress.  Findings consistent with prior inferior/inferoseptal myocardial infarction. There is a small apical infarct with mild to moderate peri-infarct ischemia.  The left ventricular ejection fraction is normal (55-65%).  Low to intermediate risk study.        Echocardiogram 12/02/2020  1. Left ventricular ejection fraction, by estimation, is 55 to 60%. The left ventricle has normal function. The left ventricle has no regional wall motion abnormalities. There is mild left ventricular hypertrophy. Left ventricular diastolic parameters are consistent with Grade I diastolic dysfunction (impaired relaxation). Elevated left atrial pressure. 2. Right ventricular systolic function is normal. The right ventricular size is normal.  3. The pericardial effusion is circumferential. 4. The mitral valve is normal in structure. No evidence of mitral valve regurgitation. No evidence of mitral stenosis. 5. The aortic valve is tricuspid. Aortic valve regurgitation is mild. No aortic stenosis is  present. 6. The inferior vena cava is normal in size with greater than 50% respiratory variability, suggesting right atrial pressure of 3 mmHg.     01/2019 event monitor  7 day event monitor. Date is available from 83% of planned monitored time  Min HR 52, Max HR 111, Avg HR 65  Sinus rhythm with episodes of aflutter with variable conduction, no significant RVR. Can have postconversion pause up to 3.2 seconds.  No symptoms reported   10/2018 echo IMPRESSIONS  1. The left ventricle has normal systolic function with an ejection  fraction of 60-65%. The cavity size was normal. There is mild concentric  left ventricular hypertrophy. Left ventricular diastolic Doppler  parameters are consistent with impaired  relaxation. Indeterminate filling pressures No evidence of left  ventricular regional wall motion abnormalities.  2. The right ventricle has normal systolic function. The cavity was  normal. There is no increase in right ventricular wall thickness.  3. The tricuspid valve is grossly normal.  4. The aortic valve is tricuspid Aortic valve regurgitation is mild by  color flow Doppler.  5. There is mild dilatation of the aortic root measuring 38 mm.  6. The interatrial septum was not well visualized.   04/2019 nuclear stress  There was no ST segment deviation noted during stress.  Findings consistent with prior inferior/inferoseptal/inferoapical myocardial infarction. No current ischemia  This is a low risk study.  The left ventricular ejection fraction is normal (55-65%).    Assessment and Plan:  1. Chest pain of uncertain etiology   2. SOB (shortness of breath)   3. Swelling   4. Atrial fibrillation, unspecified type (HCC)   5. Essential hypertension    1. Chest pain of uncertain etiology Currently denies any anginal or exertional symptoms in the interim since last visit.  Reviewed stress test results as noted above.  Continue Imdur 15 mg daily.   Continue sublingual nitroglycerin as needed.  Please refill Imdur as requested by patient.  2. SOB (shortness of breath) Complains of increased dyspnea on exertion.  States has been going on for a month or so.  Please get a repeat echocardiogram.  3. Swelling No evidence of lower extremity today.  Continue Lasix 20 mg as ordered by PCP.  Today's weight is 184.  Weight on 11/14/2020 was 186.   4.  Atrial fibrillation. Denies any palpitations or arrhythmias.  Heart rate controlled today at 72 and irregularly irregular.  Continue Eliquis 5 mg p.o. twice daily.  Has indwelling pacemaker.  Recent remote device check on 08/22/2020 by Dr. Johney Frame was normal.  5.  Essential hypertension Blood pressure well controlled today at 120/60.  Continue lisinopril 2.5 mg daily.  Continue Lasix 20 mg daily.  Medication Adjustments/Labs and Tests Ordered: Current medicines are reviewed at length with the patient today.  Concerns regarding medicines are outlined above.   Disposition: Follow-up with Dr. Wyline Mood in 6 months.  Signed, Rennis Harding, NP 01/02/2021 1:47 PM    [facility] at [facility] [address] [place], [place], [place] [address] Phone: [phone]; Fax: [phone]

## 2021-01-02 ENCOUNTER — Ambulatory Visit (INDEPENDENT_AMBULATORY_CARE_PROVIDER_SITE_OTHER): Payer: Medicare Other | Admitting: Family Medicine

## 2021-01-02 ENCOUNTER — Encounter: Payer: Self-pay | Admitting: Family Medicine

## 2021-01-02 ENCOUNTER — Other Ambulatory Visit: Payer: Self-pay

## 2021-01-02 VITALS — BP 120/60 | HR 72 | Ht 69.0 in | Wt 184.8 lb

## 2021-01-02 DIAGNOSIS — R609 Edema, unspecified: Secondary | ICD-10-CM

## 2021-01-02 DIAGNOSIS — I4891 Unspecified atrial fibrillation: Secondary | ICD-10-CM | POA: Diagnosis not present

## 2021-01-02 DIAGNOSIS — R0602 Shortness of breath: Secondary | ICD-10-CM | POA: Diagnosis not present

## 2021-01-02 DIAGNOSIS — R079 Chest pain, unspecified: Secondary | ICD-10-CM | POA: Diagnosis not present

## 2021-01-02 DIAGNOSIS — I10 Essential (primary) hypertension: Secondary | ICD-10-CM

## 2021-01-02 NOTE — Patient Instructions (Signed)
Medication Instructions:  Continue all current medications.   Labwork: none  Testing/Procedures: none  Follow-Up: 6 months   Any Other Special Instructions Will Be Listed Below (If Applicable).   If you need a refill on your cardiac medications before your next appointment, please call your pharmacy.  

## 2021-01-30 ENCOUNTER — Encounter: Payer: Self-pay | Admitting: Internal Medicine

## 2021-01-30 ENCOUNTER — Other Ambulatory Visit: Payer: Self-pay

## 2021-01-30 ENCOUNTER — Ambulatory Visit (INDEPENDENT_AMBULATORY_CARE_PROVIDER_SITE_OTHER): Payer: Medicare Other | Admitting: Internal Medicine

## 2021-01-30 VITALS — BP 104/60 | HR 77 | Ht 70.0 in | Wt 184.0 lb

## 2021-01-30 DIAGNOSIS — I10 Essential (primary) hypertension: Secondary | ICD-10-CM | POA: Diagnosis not present

## 2021-01-30 DIAGNOSIS — I480 Paroxysmal atrial fibrillation: Secondary | ICD-10-CM

## 2021-01-30 DIAGNOSIS — R001 Bradycardia, unspecified: Secondary | ICD-10-CM

## 2021-01-30 NOTE — Progress Notes (Signed)
PCP: Benita Stabile, MD Primary Cardiologist: Dr Wyline Mood Primary EP:  Dr Johney Frame  [name] is a 75 y.o. male who presents today for routine electrophysiology followup.  Since last being seen in our clinic, the patient reports doing reasonably  well.   He has occasional CP.  Rare SOB.  + frequent fatigue, poor energy.  Today, he denies symptoms of palpitations,  lower extremity edema,  presyncope, or syncope.  He is unsteady.  Less falls now that he is using a cane. The patient is otherwise without complaint today.   Past Medical History:  Diagnosis Date   Alcohol use    Anxiety    Chest pain    Coronary artery disease    a. Nonobstructive CAD by cath 2010 with negative nuclear stress test 05/2013.   Depression    Diabetes mellitus    Hyperlipidemia    Hypertension    Hypothyroidism    Kidney problem    a. 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   Mild aortic insufficiency    PAT (paroxysmal atrial tachycardia) (HCC)    Pericardial effusion    a. by echo 2014.   Pulmonary nodules    resolved   PVC's (premature ventricular contractions)    Stroke (cerebrum) (HCC)    Stroke due to embolism of left carotid artery (HCC) 03/24/2016   Left caudate head stroke   Wide-complex tachycardia (HCC)    a. per [name] note: Event monitor September 2014 sinus rhythm, occasional PVCs, nonsustained PAT, and one 7 beat run of a wide QRS complex at 110-120 bpm, with minimal changes QRS axis, and QRS morphology not similar to documented PVCs.    Past Surgical History:  Procedure Laterality Date   BACK SURGERY  1996   BREAST LUMPECTOMY Left 1979   CIRCUMCISION  1980   MANDIBLE SURGERY Right 1969   PACEMAKER IMPLANT N/A 06/26/2019   Procedure: PACEMAKER IMPLANT;  Surgeon: Hillis Range, MD;  Location: [name];  Service: Cardiovascular;  Laterality: N/A;    ROS- all systems are reviewed and negative except as per HPI above  Current Outpatient Medications  Medication Sig  Dispense Refill   acetaminophen (TYLENOL) 500 MG tablet Take 500 mg by mouth every 8 (eight) hours as needed for moderate pain or headache.     ALPRAZolam (XANAX) 1 MG tablet Take 1 mg by mouth in the morning and at bedtime.  2   atorvastatin (LIPITOR) 20 MG tablet Take 20 mg by mouth daily.      buPROPion (WELLBUTRIN SR) 100 MG 12 hr tablet Take 100 mg by mouth every morning.     diphenhydrAMINE (BENADRYL) 25 MG tablet Take 25 mg by mouth daily as needed for allergies.     ELIQUIS 5 MG TABS tablet TAKE 1 TABLET BY MOUTH TWICE DAILY BEFORE BREAKFAST AND EVERY EVENING 60 tablet 5   furosemide (LASIX) 20 MG tablet Take 20 mg by mouth daily.     gabapentin (NEURONTIN) 600 MG tablet Take 600 mg by mouth with breakfast, with lunch, and with evening meal.  1   glimepiride (AMARYL) 2 MG tablet Take 2 mg by mouth daily.     isosorbide mononitrate (IMDUR) 30 MG 24 hr tablet Take 0.5 tablets (15 mg total) by mouth daily. 15 tablet 6   levothyroxine (SYNTHROID) 125 MCG tablet Take 125 mcg by mouth daily before breakfast.     lisinopril (ZESTRIL) 2.5 MG tablet Take 2.5 mg by mouth in the morning.  metFORMIN (GLUCOPHAGE) 500 MG tablet Take 1-2 tablets (500-1,000 mg total) by mouth See admin instructions. Take 1000 mg in the morning and 500 mg in the evening     nitroGLYCERIN (NITROSTAT) 0.4 MG SL tablet Place 0.4 mg under the tongue every 5 (five) minutes as needed for chest pain.      omega-3 acid ethyl esters (LOVAZA) 1 g capsule Take 2 g by mouth 2 (two) times daily.  12   oxyCODONE-acetaminophen (PERCOCET) 10-325 MG tablet Take 1 tablet by mouth every 4 (four) hours.  0   No current facility-administered medications for this visit.    Physical Exam: Vitals:   01/30/21 1049  BP: 104/60  Pulse: 77  SpO2: 95%  Weight: 184 lb (83.5 kg)  Height: 5\' 10"  (1.778 m)    GEN- The patient is well appearing, alert and oriented x 3 today.   Head- normocephalic, atraumatic Eyes-  Sclera clear,  conjunctiva pink Ears- hearing intact Oropharynx- clear Lungs- Clear to ausculation bilaterally, normal work of breathing Chest- pacemaker pocket is well healed Heart- Regular rate and rhythm, no murmurs, rubs or gallops, PMI not laterally displaced GI- soft, NT, ND, + BS Extremities- no clubbing, cyanosis, or edema  Pacemaker interrogation- reviewed in detail today,  See PACEART report  Echo 12/02/20- EF 55%, grade I diastolic dysfunction    Assessment and Plan:  1. Symptomatic sinus bradycardia and post termination pauses Normal pacemaker function See Pace Art report No changes today he is not device dependant today  2. Paroxysmal atrial fibrillation On eliquis AF burden is 12 % (previously 6.2%)  3. Atypical chest pain Chronic and stable  4. HTN Stable No change required today  Return in a year  02/01/21 MD, [facility] 01/30/2021 10:58 AM

## 2021-01-30 NOTE — Patient Instructions (Signed)
Medication Instructions:  Continue all current medications.  Labwork: none  Testing/Procedures: none  Follow-Up: 1 year   Any Other Special Instructions Will Be Listed Below (If Applicable).  If you need a refill on your cardiac medications before your next appointment, please call your pharmacy.  

## 2021-02-13 ENCOUNTER — Ambulatory Visit: Payer: Medicare Other | Admitting: Cardiology

## 2021-02-16 DIAGNOSIS — E1122 Type 2 diabetes mellitus with diabetic chronic kidney disease: Secondary | ICD-10-CM | POA: Diagnosis not present

## 2021-02-19 ENCOUNTER — Ambulatory Visit (INDEPENDENT_AMBULATORY_CARE_PROVIDER_SITE_OTHER): Payer: Medicare Other

## 2021-02-19 ENCOUNTER — Other Ambulatory Visit (HOSPITAL_COMMUNITY): Payer: Self-pay | Admitting: Radiology

## 2021-02-19 DIAGNOSIS — I495 Sick sinus syndrome: Secondary | ICD-10-CM | POA: Diagnosis not present

## 2021-02-19 DIAGNOSIS — R062 Wheezing: Secondary | ICD-10-CM

## 2021-02-19 DIAGNOSIS — Z79899 Other long term (current) drug therapy: Secondary | ICD-10-CM | POA: Diagnosis not present

## 2021-02-19 DIAGNOSIS — M545 Low back pain, unspecified: Secondary | ICD-10-CM | POA: Diagnosis not present

## 2021-02-19 DIAGNOSIS — E039 Hypothyroidism, unspecified: Secondary | ICD-10-CM | POA: Diagnosis not present

## 2021-02-19 DIAGNOSIS — E782 Mixed hyperlipidemia: Secondary | ICD-10-CM | POA: Diagnosis not present

## 2021-02-19 DIAGNOSIS — K219 Gastro-esophageal reflux disease without esophagitis: Secondary | ICD-10-CM | POA: Diagnosis not present

## 2021-02-19 DIAGNOSIS — G894 Chronic pain syndrome: Secondary | ICD-10-CM | POA: Diagnosis not present

## 2021-02-19 DIAGNOSIS — E1165 Type 2 diabetes mellitus with hyperglycemia: Secondary | ICD-10-CM | POA: Diagnosis not present

## 2021-02-19 DIAGNOSIS — I129 Hypertensive chronic kidney disease with stage 1 through stage 4 chronic kidney disease, or unspecified chronic kidney disease: Secondary | ICD-10-CM | POA: Diagnosis not present

## 2021-02-19 DIAGNOSIS — D696 Thrombocytopenia, unspecified: Secondary | ICD-10-CM | POA: Diagnosis not present

## 2021-02-19 DIAGNOSIS — E1142 Type 2 diabetes mellitus with diabetic polyneuropathy: Secondary | ICD-10-CM | POA: Diagnosis not present

## 2021-02-19 LAB — CUP PACEART REMOTE DEVICE CHECK
Battery Remaining Longevity: 111 mo
Battery Remaining Percentage: 90 %
Battery Voltage: 3.01 V
Brady Statistic AP VP Percent: 1 %
Brady Statistic AP VS Percent: 6.1 %
Brady Statistic AS VP Percent: 1 %
Brady Statistic AS VS Percent: 94 %
Brady Statistic RA Percent Paced: 5.1 %
Brady Statistic RV Percent Paced: 2.9 %
Date Time Interrogation Session: 20220630020013
Implantable Lead Implant Date: 20201103
Implantable Lead Implant Date: 20201103
Implantable Lead Location: 753859
Implantable Lead Location: 753860
Implantable Pulse Generator Implant Date: 20201103
Lead Channel Impedance Value: 410 Ohm
Lead Channel Impedance Value: 480 Ohm
Lead Channel Pacing Threshold Amplitude: 0.5 V
Lead Channel Pacing Threshold Amplitude: 0.5 V
Lead Channel Pacing Threshold Pulse Width: 0.5 ms
Lead Channel Pacing Threshold Pulse Width: 0.5 ms
Lead Channel Sensing Intrinsic Amplitude: 1.5 mV
Lead Channel Sensing Intrinsic Amplitude: 11 mV
Lead Channel Setting Pacing Amplitude: 2 V
Lead Channel Setting Pacing Amplitude: 2.5 V
Lead Channel Setting Pacing Pulse Width: 0.5 ms
Lead Channel Setting Sensing Sensitivity: 2 mV
Pulse Gen Model: 2272
Pulse Gen Serial Number: 9174806

## 2021-02-25 ENCOUNTER — Other Ambulatory Visit (HOSPITAL_COMMUNITY)
Admission: RE | Admit: 2021-02-25 | Discharge: 2021-02-25 | Disposition: A | Discharge status: Home / Self Care | Payer: Medicare Other | Source: Ambulatory Visit | Attending: Internal Medicine | Admitting: Internal Medicine

## 2021-02-25 ENCOUNTER — Other Ambulatory Visit: Payer: Self-pay

## 2021-02-25 DIAGNOSIS — Z01812 Encounter for preprocedural laboratory examination: Secondary | ICD-10-CM | POA: Diagnosis not present

## 2021-02-25 DIAGNOSIS — Z20822 Contact with and (suspected) exposure to covid-19: Secondary | ICD-10-CM | POA: Diagnosis not present

## 2021-02-25 LAB — SARS CORONAVIRUS 2 (TAT 6-24 HRS): SARS Coronavirus 2: NEGATIVE

## 2021-02-27 ENCOUNTER — Other Ambulatory Visit: Payer: Self-pay

## 2021-02-27 ENCOUNTER — Ambulatory Visit (HOSPITAL_COMMUNITY)
Admission: RE | Admit: 2021-02-27 | Discharge: 2021-02-27 | Disposition: A | Discharge status: Home / Self Care | Payer: Medicare Other | Source: Ambulatory Visit | Attending: Internal Medicine | Admitting: Internal Medicine

## 2021-02-27 DIAGNOSIS — R062 Wheezing: Secondary | ICD-10-CM | POA: Diagnosis not present

## 2021-02-27 LAB — PULMONARY FUNCTION TEST
FEF 25-75 Post: 2.57 L/sec
FEF 25-75 Pre: 3.61 L/sec
FEF2575-%Change-Post: -28 %
FEF2575-%Pred-Post: 115 %
FEF2575-%Pred-Pre: 161 %
FEV1-%Change-Post: 0 %
FEV1-%Pred-Post: 94 %
FEV1-%Pred-Pre: 94 %
FEV1-Post: 2.86 L
FEV1-Pre: 2.84 L
FEV1FVC-%Change-Post: -14 %
FEV1FVC-%Pred-Pre: 124 %
FEV6-%Change-Post: 17 %
FEV6-%Pred-Post: 94 %
FEV6-%Pred-Pre: 80 %
FEV6-Post: 3.68 L
FEV6-Pre: 3.13 L
FEV6FVC-%Pred-Post: 106 %
FEV6FVC-%Pred-Pre: 106 %
FVC-%Change-Post: 17 %
FVC-%Pred-Post: 88 %
FVC-%Pred-Pre: 75 %
FVC-Post: 3.68 L
FVC-Pre: 3.13 L
Post FEV1/FVC ratio: 78 %
Post FEV6/FVC ratio: 100 %
Pre FEV1/FVC ratio: 91 %
Pre FEV6/FVC Ratio: 100 %
RV % pred: 67 %
RV: 1.66 L
TLC % pred: 81 %
TLC: 5.6 L

## 2021-02-27 MED ORDER — ALBUTEROL SULFATE (2.5 MG/3ML) 0.083% IN NEBU
2.5000 mg | INHALATION_SOLUTION | Freq: Once | RESPIRATORY_TRACT | Status: AC
  Administered 2021-02-27: 2.5 mg via RESPIRATORY_TRACT

## 2021-03-11 NOTE — Progress Notes (Signed)
Remote pacemaker transmission.   

## 2021-05-21 ENCOUNTER — Ambulatory Visit (INDEPENDENT_AMBULATORY_CARE_PROVIDER_SITE_OTHER): Payer: Medicare Other

## 2021-05-21 DIAGNOSIS — I495 Sick sinus syndrome: Secondary | ICD-10-CM | POA: Diagnosis not present

## 2021-05-21 LAB — CUP PACEART REMOTE DEVICE CHECK
Battery Remaining Longevity: 109 mo
Battery Remaining Percentage: 88 %
Battery Voltage: 3.01 V
Brady Statistic AP VP Percent: 1 %
Brady Statistic AP VS Percent: 16 %
Brady Statistic AS VP Percent: 1 %
Brady Statistic AS VS Percent: 84 %
Brady Statistic RA Percent Paced: 15 %
Brady Statistic RV Percent Paced: 1.4 %
Date Time Interrogation Session: 20220929020012
Implantable Lead Implant Date: 20201103
Implantable Lead Implant Date: 20201103
Implantable Lead Location: 753859
Implantable Lead Location: 753860
Implantable Pulse Generator Implant Date: 20201103
Lead Channel Impedance Value: 430 Ohm
Lead Channel Impedance Value: 480 Ohm
Lead Channel Pacing Threshold Amplitude: 0.5 V
Lead Channel Pacing Threshold Amplitude: 0.5 V
Lead Channel Pacing Threshold Pulse Width: 0.5 ms
Lead Channel Pacing Threshold Pulse Width: 0.5 ms
Lead Channel Sensing Intrinsic Amplitude: 1.1 mV
Lead Channel Sensing Intrinsic Amplitude: 11.3 mV
Lead Channel Setting Pacing Amplitude: 2 V
Lead Channel Setting Pacing Amplitude: 2.5 V
Lead Channel Setting Pacing Pulse Width: 0.5 ms
Lead Channel Setting Sensing Sensitivity: 2 mV
Pulse Gen Model: 2272
Pulse Gen Serial Number: 9174806

## 2021-05-25 DIAGNOSIS — I10 Essential (primary) hypertension: Secondary | ICD-10-CM | POA: Diagnosis not present

## 2021-05-25 DIAGNOSIS — E1169 Type 2 diabetes mellitus with other specified complication: Secondary | ICD-10-CM | POA: Diagnosis not present

## 2021-05-25 DIAGNOSIS — E039 Hypothyroidism, unspecified: Secondary | ICD-10-CM | POA: Diagnosis not present

## 2021-05-26 ENCOUNTER — Encounter: Payer: Self-pay | Admitting: Cardiology

## 2021-05-28 NOTE — Progress Notes (Signed)
Remote pacemaker transmission.   

## 2021-06-03 DIAGNOSIS — E1165 Type 2 diabetes mellitus with hyperglycemia: Secondary | ICD-10-CM | POA: Diagnosis not present

## 2021-06-03 DIAGNOSIS — M545 Low back pain, unspecified: Secondary | ICD-10-CM | POA: Diagnosis not present

## 2021-06-03 DIAGNOSIS — E782 Mixed hyperlipidemia: Secondary | ICD-10-CM | POA: Diagnosis not present

## 2021-06-03 DIAGNOSIS — Z79899 Other long term (current) drug therapy: Secondary | ICD-10-CM | POA: Diagnosis not present

## 2021-06-03 DIAGNOSIS — K219 Gastro-esophageal reflux disease without esophagitis: Secondary | ICD-10-CM | POA: Diagnosis not present

## 2021-06-03 DIAGNOSIS — G894 Chronic pain syndrome: Secondary | ICD-10-CM | POA: Diagnosis not present

## 2021-06-03 DIAGNOSIS — E039 Hypothyroidism, unspecified: Secondary | ICD-10-CM | POA: Diagnosis not present

## 2021-06-03 DIAGNOSIS — E1142 Type 2 diabetes mellitus with diabetic polyneuropathy: Secondary | ICD-10-CM | POA: Diagnosis not present

## 2021-06-03 DIAGNOSIS — D696 Thrombocytopenia, unspecified: Secondary | ICD-10-CM | POA: Diagnosis not present

## 2021-06-03 DIAGNOSIS — I129 Hypertensive chronic kidney disease with stage 1 through stage 4 chronic kidney disease, or unspecified chronic kidney disease: Secondary | ICD-10-CM | POA: Diagnosis not present

## 2021-06-09 ENCOUNTER — Encounter: Payer: Self-pay | Admitting: Internal Medicine

## 2021-06-10 DIAGNOSIS — H905 Unspecified sensorineural hearing loss: Secondary | ICD-10-CM | POA: Diagnosis not present

## 2021-06-17 ENCOUNTER — Other Ambulatory Visit: Payer: Self-pay | Admitting: Family Medicine

## 2021-06-17 ENCOUNTER — Other Ambulatory Visit: Payer: Self-pay | Admitting: Cardiology

## 2021-06-17 DIAGNOSIS — I480 Paroxysmal atrial fibrillation: Secondary | ICD-10-CM

## 2021-06-18 NOTE — Telephone Encounter (Signed)
Prescription refill request for Eliquis received. Indication: afib  Last office visit: Allred, 01/30/2021 Scr: 1.34, 08/11/2020 Age: 75 yo  Weight: 83.5 kg    Appropriate dose. Refill sent.

## 2021-08-20 ENCOUNTER — Other Ambulatory Visit: Payer: Self-pay

## 2021-08-20 ENCOUNTER — Ambulatory Visit (INDEPENDENT_AMBULATORY_CARE_PROVIDER_SITE_OTHER): Payer: Medicare Other | Admitting: Cardiology

## 2021-08-20 ENCOUNTER — Ambulatory Visit (INDEPENDENT_AMBULATORY_CARE_PROVIDER_SITE_OTHER): Payer: Medicare Other

## 2021-08-20 ENCOUNTER — Encounter: Payer: Self-pay | Admitting: Cardiology

## 2021-08-20 VITALS — BP 128/80 | HR 62 | Ht 70.0 in | Wt 182.6 lb

## 2021-08-20 DIAGNOSIS — I495 Sick sinus syndrome: Secondary | ICD-10-CM | POA: Diagnosis not present

## 2021-08-20 DIAGNOSIS — I4891 Unspecified atrial fibrillation: Secondary | ICD-10-CM | POA: Diagnosis not present

## 2021-08-20 DIAGNOSIS — R0789 Other chest pain: Secondary | ICD-10-CM

## 2021-08-20 LAB — CUP PACEART REMOTE DEVICE CHECK
Battery Remaining Longevity: 106 mo
Battery Remaining Percentage: 87 %
Battery Voltage: 3.01 V
Brady Statistic AP VP Percent: 1 %
Brady Statistic AP VS Percent: 16 %
Brady Statistic AS VP Percent: 1 %
Brady Statistic AS VS Percent: 84 %
Brady Statistic RA Percent Paced: 15 %
Brady Statistic RV Percent Paced: 1 %
Date Time Interrogation Session: 20221229054814
Implantable Lead Implant Date: 20201103
Implantable Lead Implant Date: 20201103
Implantable Lead Location: 753859
Implantable Lead Location: 753860
Implantable Pulse Generator Implant Date: 20201103
Lead Channel Impedance Value: 410 Ohm
Lead Channel Impedance Value: 480 Ohm
Lead Channel Pacing Threshold Amplitude: 0.5 V
Lead Channel Pacing Threshold Amplitude: 0.5 V
Lead Channel Pacing Threshold Pulse Width: 0.5 ms
Lead Channel Pacing Threshold Pulse Width: 0.5 ms
Lead Channel Sensing Intrinsic Amplitude: 1.5 mV
Lead Channel Sensing Intrinsic Amplitude: 11.4 mV
Lead Channel Setting Pacing Amplitude: 2 V
Lead Channel Setting Pacing Amplitude: 2.5 V
Lead Channel Setting Pacing Pulse Width: 0.5 ms
Lead Channel Setting Sensing Sensitivity: 2 mV
Pulse Gen Model: 2272
Pulse Gen Serial Number: 9174806

## 2021-08-20 MED ORDER — ISOSORBIDE MONONITRATE ER 30 MG PO TB24: 30.0000 mg | ORAL_TABLET | Freq: Every day | ORAL | 6 refills | Status: DC

## 2021-08-20 NOTE — Patient Instructions (Signed)
Medication Instructions:  Increase Imdur to 30mg daily  Continue all other medications.     Labwork: none  Testing/Procedures: none  Follow-Up: 4 months   Any Other Special Instructions Will Be Listed Below (If Applicable).   If you need a refill on your cardiac medications before your next appointment, please call your pharmacy.  

## 2021-08-20 NOTE — Progress Notes (Signed)
Clinical Summary [name] a 75 y.o.male seen today for follow up of the following medical problems.    1. PAF - noted during 10/2018 admission - had not been on anticoag due to frequent falls previously.    - since pacemaker placement falls have resolved, started on eliquis 5mg  bid.      - no palpitations. COmpliant with meds - no bleeding on eliquis   2. Bradycardia w/ pacemaker - chronic according ot notes. 10/2018 admissoin had some rates to 40s and 50s - heart monitor showed bradycardia and post termination pauses up to 3.2 seconds    06/26/19 pacemaker [employer] medical assurity dual chamber placed   07/2021 normal device check   3.Chest pain - extensive prior workup including cath in 2010 and stress test 2014 - admit 10/2018 with negative enzymes and echo - 04/2019 stress test no ischemia   -seen in ER yesterday with chest pain - trop neg x 2. EKG SR, no acute ischemic changes - came one at rest, felt nauseous and layed down and chest started hurting. Midleft chest, stabbing pain, 5/10 in severity. Not positional. +SOB. Pain lasted about 1 hour, self resolved.  11/2020 nuclear stress: inferior/inferoseptal infarct. Mild to mod peri-infarct ischemia apical.   -started on imdur 15 mg daily Some ongoing chest pressure at times. Better with NG -     4. Orthostatic dizziness -Improved since stopping lisinoopril  - infrequent symptoms.        Prior cardiology notes   Prevoiusly followed by [facility] cardiology. From notes had normal cardiac monitor 2016. echocardiogram October 2014 ejection fraction 50-55%, G1DD, mild left atrial margin, 1+ AR, 1+ TR, no significant pericardial effusion -Echocardiogram interpreted by Dr. [facility] office), September 2014, preserved ejection fraction, small pericardial effusion, 1+ MR, 2+ AR  -Event monitor September 2014 sinus rhythm, occasional PVCs, nonsustained PAT, and one 7 beat run of a wide QRS complex at 110-120 bpm,  with minimal changes QRS axis, and QRS morphology not similar to documented PVCs.  2. Heart catheterization 2010 nonobstructive coronary disease  -exercise Cardiolite stress test negative for ischemia with ejection fraction 61% October 2014   Past Medical History:  Diagnosis Date   Alcohol use    Anxiety    Chest pain    Coronary artery disease    a. Nonobstructive CAD by cath 2010 with negative nuclear stress test 05/2013.   Depression    Diabetes mellitus    Hyperlipidemia    Hypertension    Hypothyroidism    Kidney problem    a. 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   Mild aortic insufficiency    PAT (paroxysmal atrial tachycardia) (HCC)    Pericardial effusion    a. by echo 2014.   Pulmonary nodules    resolved   PVC's (premature ventricular contractions)    Stroke (cerebrum) (HCC)    Stroke due to embolism of left carotid artery ([place]) 03/24/2016   Left caudate head stroke   Wide-complex tachycardia ([place])    a. per [name] note: Event monitor September 2014 sinus rhythm, occasional PVCs, nonsustained PAT, and one 7 beat run of a wide QRS complex at 110-120 bpm, with minimal changes QRS axis, and QRS morphology not similar to documented PVCs.      Allergies  Allergen Reactions   Morphine Other (See Comments)    Affected the heart- was told to not take this again   Tape Other (See Comments)    Certain tapes  TEAR THE SKIN   Sulfonamide Derivatives Rash and Other (See Comments)    Blistering on the skin     Current Outpatient Medications  Medication Sig Dispense Refill   acetaminophen (TYLENOL) 500 MG tablet Take 500 mg by mouth every 8 (eight) hours as needed for moderate pain or headache.     ALPRAZolam (XANAX) 1 MG tablet Take 1 mg by mouth in the morning and at bedtime.  2   apixaban (ELIQUIS) 5 MG TABS tablet Take 1 tablet (5 mg total) by mouth 2 (two) times daily. 60 tablet 5   atorvastatin (LIPITOR) 20 MG tablet Take 20 mg by mouth daily.      buPROPion  (WELLBUTRIN SR) 100 MG 12 hr tablet Take 100 mg by mouth every morning.     diphenhydrAMINE (BENADRYL) 25 MG tablet Take 25 mg by mouth daily as needed for allergies.     furosemide (LASIX) 20 MG tablet Take 20 mg by mouth daily.     gabapentin (NEURONTIN) 600 MG tablet Take 600 mg by mouth with breakfast, with lunch, and with evening meal.  1   glimepiride (AMARYL) 2 MG tablet Take 2 mg by mouth daily.     isosorbide mononitrate (IMDUR) 30 MG 24 hr tablet TAKE 1/2 TABLET BY MOUTH DAILY 15 tablet 6   levothyroxine (SYNTHROID) 125 MCG tablet Take 125 mcg by mouth daily before breakfast.     lisinopril (ZESTRIL) 2.5 MG tablet Take 2.5 mg by mouth in the morning.     metFORMIN (GLUCOPHAGE) 500 MG tablet Take 1-2 tablets (500-1,000 mg total) by mouth See admin instructions. Take 1000 mg in the morning and 500 mg in the evening     nitroGLYCERIN (NITROSTAT) 0.4 MG SL tablet Place 0.4 mg under the tongue every 5 (five) minutes as needed for chest pain.      omega-3 acid ethyl esters (LOVAZA) 1 g capsule Take 2 g by mouth 2 (two) times daily.  12   oxyCODONE-acetaminophen (PERCOCET) 10-325 MG tablet Take 1 tablet by mouth every 4 (four) hours.  0   No current facility-administered medications for this visit.     Past Surgical History:  Procedure Laterality Date   BACK SURGERY  1996   BREAST LUMPECTOMY Left 1979   CIRCUMCISION  1980   MANDIBLE SURGERY Right 1969   PACEMAKER IMPLANT N/A 06/26/2019   Procedure: PACEMAKER IMPLANT;  Surgeon: Ronald Grayer, MD;  Location: [facility];  Service: Cardiovascular;  Laterality: N/A;     Allergies  Allergen Reactions   Morphine Other (See Comments)    Affected the heart- was told to not take this again   Tape Other (See Comments)    Certain tapes TEAR THE SKIN   Sulfonamide Derivatives Rash and Other (See Comments)    Blistering on the skin      Family History  Problem Relation Age of Onset   Lung cancer Mother    Diabetes Father     Bladder Cancer Father    Colon cancer Brother    Stroke Maternal Grandmother    Cancer Maternal Grandfather    Stroke Paternal Grandmother    Stroke Paternal Grandfather      Social History Mr. Weisman reports that he quit smoking about 39 years ago. He has never used smokeless tobacco. Mr. Bodart reports that he does not currently use alcohol.   Review of Systems CONSTITUTIONAL: No weight loss, fever, chills, weakness or fatigue.  HEENT: Eyes: No visual loss, blurred vision,  double vision or yellow sclerae.No hearing loss, sneezing, congestion, runny nose or sore throat.  SKIN: No rash or itching.  CARDIOVASCULAR: per hpi RESPIRATORY: No shortness of breath, cough or sputum.  GASTROINTESTINAL: No anorexia, nausea, vomiting or diarrhea. No abdominal pain or blood.  GENITOURINARY: No burning on urination, no polyuria NEUROLOGICAL: No headache, dizziness, syncope, paralysis, ataxia, numbness or tingling in the extremities. No change in bowel or bladder control.  MUSCULOSKELETAL: No muscle, back pain, joint pain or stiffness.  LYMPHATICS: No enlarged nodes. No history of splenectomy.  PSYCHIATRIC: No history of depression or anxiety.  ENDOCRINOLOGIC: No reports of sweating, cold or heat intolerance. No polyuria or polydipsia.  Marland Kitchen   Physical Examination Today's Vitals   08/20/21 1231  BP: 128/80  Pulse: 62  SpO2: 96%  Weight: 182 lb 9.6 oz (82.8 kg)  Height: 5\' 10"  (1.778 m)   Body mass index Armstrong 26.2 kg/m.  Gen: resting comfortably, no acute distress HEENT: no scleral icterus, pupils equal round and reactive, no palptable cervical adenopathy,  CV: RRR, no m/r/g no jvd Resp: Clear to auscultation bilaterally GI: abdomen Armstrong soft, non-tender, non-distended, normal bowel sounds, no hepatosplenomegaly MSK: extremities are warm, no edema.  Skin: warm, no rash Neuro:  no focal deficits Psych: appropriate affect   Diagnostic Studies 01/2019 event monitor 7 day event  monitor. Date Armstrong available from 83% of planned monitored time Min HR 52, Max HR 111, Avg HR 65 Sinus rhythm with episodes of aflutter with variable conduction, no significant RVR. Can have postconversion pause up to 3.2 seconds. No symptoms reported     10/2018 echo IMPRESSIONS     1. The left ventricle has normal systolic function with an ejection  fraction of 60-65%. The cavity size was normal. There Armstrong mild concentric  left ventricular hypertrophy. Left ventricular diastolic Doppler  parameters are consistent with impaired  relaxation. Indeterminate filling pressures No evidence of left  ventricular regional wall motion abnormalities.   2. The right ventricle has normal systolic function. The cavity was  normal. There Armstrong no increase in right ventricular wall thickness.   3. The tricuspid valve Armstrong grossly normal.   4. The aortic valve Armstrong tricuspid Aortic valve regurgitation Armstrong mild by  color flow Doppler.   5. There Armstrong mild dilatation of the aortic root measuring 38 mm.   6. The interatrial septum was not well visualized.      04/2019 nuclear stress There was no ST segment deviation noted during stress. Findings consistent with prior inferior/inferoseptal/inferoapical myocardial infarction. No current ischemia This Armstrong a Armstrong risk study. The left ventricular ejection fraction Armstrong normal (55-65%).  11/2020 echo  1. Left ventricular ejection fraction, by estimation, Armstrong 55 to 60%. The  left ventricle has normal function. The left ventricle has no regional  wall motion abnormalities. There Armstrong mild left ventricular hypertrophy.  Left ventricular diastolic parameters  are consistent with Grade I diastolic dysfunction (impaired relaxation).  Elevated left atrial pressure.   2. Right ventricular systolic function Armstrong normal. The right ventricular  size Armstrong normal.   3. The pericardial effusion Armstrong circumferential.   4. The mitral valve Armstrong normal in structure. No evidence of mitral valve   regurgitation. No evidence of mitral stenosis.   5. The aortic valve Armstrong tricuspid. Aortic valve regurgitation Armstrong mild. No  aortic stenosis Armstrong present.   6. The inferior vena cava Armstrong normal in size with greater than 50%  respiratory variability, suggesting right atrial pressure of 3  mmHg.   11/2020 nuclear stress There was no ST segment deviation noted during stress. Findings consistent with prior inferior/inferoseptal myocardial infarction. There Armstrong a small apical infarct with mild to moderate peri-infarct ischemia. The left ventricular ejection fraction Armstrong normal (55-65%). Armstrong to intermediate risk study.   Assessment and Plan  1. Chest pain - long history of chest pain symptoms with negative workups in the past including cath, multiple stress tests  - most recent stress test Armstrong to intermediate risk, working with medical therapy to manage symptoms - some ongoing chest pains though infrequent, will increase imdur to 30mg  daily. If refractory over time or progressive pains consider cath at that time.      2. Afib - no symptoms, continue current meds      , M.D.

## 2021-08-21 ENCOUNTER — Encounter: Payer: Self-pay | Admitting: *Deleted

## 2021-09-01 NOTE — Progress Notes (Signed)
Remote pacemaker transmission.   

## 2021-09-15 DIAGNOSIS — E1122 Type 2 diabetes mellitus with diabetic chronic kidney disease: Secondary | ICD-10-CM | POA: Diagnosis not present

## 2021-09-15 DIAGNOSIS — I10 Essential (primary) hypertension: Secondary | ICD-10-CM | POA: Diagnosis not present

## 2021-09-15 DIAGNOSIS — E039 Hypothyroidism, unspecified: Secondary | ICD-10-CM | POA: Diagnosis not present

## 2021-09-16 DIAGNOSIS — D696 Thrombocytopenia, unspecified: Secondary | ICD-10-CM | POA: Diagnosis not present

## 2021-09-16 DIAGNOSIS — E782 Mixed hyperlipidemia: Secondary | ICD-10-CM | POA: Diagnosis not present

## 2021-09-16 DIAGNOSIS — Z79899 Other long term (current) drug therapy: Secondary | ICD-10-CM | POA: Diagnosis not present

## 2021-09-16 DIAGNOSIS — I129 Hypertensive chronic kidney disease with stage 1 through stage 4 chronic kidney disease, or unspecified chronic kidney disease: Secondary | ICD-10-CM | POA: Diagnosis not present

## 2021-09-16 DIAGNOSIS — E039 Hypothyroidism, unspecified: Secondary | ICD-10-CM | POA: Diagnosis not present

## 2021-09-16 DIAGNOSIS — G894 Chronic pain syndrome: Secondary | ICD-10-CM | POA: Diagnosis not present

## 2021-09-16 DIAGNOSIS — M545 Low back pain, unspecified: Secondary | ICD-10-CM | POA: Diagnosis not present

## 2021-09-16 DIAGNOSIS — E1165 Type 2 diabetes mellitus with hyperglycemia: Secondary | ICD-10-CM | POA: Diagnosis not present

## 2021-09-16 DIAGNOSIS — E1142 Type 2 diabetes mellitus with diabetic polyneuropathy: Secondary | ICD-10-CM | POA: Diagnosis not present

## 2021-09-16 DIAGNOSIS — K219 Gastro-esophageal reflux disease without esophagitis: Secondary | ICD-10-CM | POA: Diagnosis not present

## 2021-09-30 ENCOUNTER — Telehealth: Payer: Self-pay | Admitting: Cardiology

## 2021-09-30 NOTE — Telephone Encounter (Signed)
Last OV 07/2021, seen for follow-up chest pain. Will route to pharm then pt will need call.

## 2021-09-30 NOTE — Telephone Encounter (Signed)
° °  Pre-operative Risk Assessment    Patient Name: [name]  DOB: [birth year] MRN: [id number]      Request for Surgical Clearance    Procedure:   Spinal injection  Date of Surgery:  Clearance TBD                                 Surgeon:  Dr. Donavan Foil Surgeon's Group or Practice Name:  [facility] Phone number:  [phone] Fax number:  [phone]   Type of Clearance Requested:   - Pharmacy:  Hold Apixaban (Eliquis) 3 days   Type of Anesthesia:  Not Indicated   Additional requests/questions:    Sandrea Hammond   09/30/2021, 1:16 PM

## 2021-10-01 NOTE — Telephone Encounter (Signed)
Patient with diagnosis of afib on Eliquis for anticoagulation.    Procedure: spinal injection Date of procedure: TBD  CHA2DS2-VASc Score = 6  This indicates a 9.7% annual risk of stroke. The patient's score is based upon: CHF History: 0 HTN History: 1 Diabetes History: 1 Stroke History: 2 Vascular Disease History: 1 Age Score: 1 Gender Score: 0   CrCl 59mL/min Platelet count 158K  3 day Eliquis hold is requested for spinal procedure. Given pt's elevated CV risk including prior stroke, will route to MD for input.

## 2021-10-02 NOTE — Telephone Encounter (Signed)
Ok to hold eliquis for 3 days prior to procedure   J Vuong Musa MD 

## 2021-10-04 NOTE — Telephone Encounter (Signed)
Please inform the patient to hold Eliquis for 3 days prior to the back injection and restart as soon as possible afterward at the surgeon's discretion.

## 2021-10-04 NOTE — Telephone Encounter (Signed)
° ° °  Patient Name: [name]  DOB: 12/29/1945 MRN: [id number]  Primary Cardiologist: Dina Rich, MD  Chart reviewed as part of pre-operative protocol coverage. Given past medical history and time since last visit, based on ACC/AHA guidelines, [name] would be at acceptable risk for the planned procedure without further cardiovascular testing.   The patient was advised that if he develops new symptoms prior to surgery to contact our office to arrange for a follow-up visit, and he verbalized understanding.  Patient may hold Eliquis for 3 days prior to the procedure and restart as soon as possible afterward at the surgeon's discretion.  I will route this recommendation to the requesting party via Epic fax function and remove from pre-op pool.  Please call with questions.  [place], [place] 10/04/2021, 6:05 PM

## 2021-10-05 NOTE — Telephone Encounter (Signed)
I tried to call the pt to go over recommendations to hold Eliquis x 3 days prior to injection, see notes. VM is full could not leave a message for call back. I will send clearance notes to Dr. Murray Hodgkins office that the pt has been cleared, but unable to reach him about holding his Eliquis.

## 2021-10-05 NOTE — Telephone Encounter (Signed)
I s/w the pt and he is aware he has been cleared and to hold Eliquis x 3 days prior to injection. Pt aware that his last dose to take will be on 10/10/21, hold 2/19, 2/20, 2/21, procedure on 2/22 and resume as soon as Dr. Brien Few feels it is safe. Pt thanked me for the call and the help. I will fax update to Dr. Brien Few that I have s/w the pt.

## 2021-10-14 DIAGNOSIS — I4891 Unspecified atrial fibrillation: Secondary | ICD-10-CM | POA: Diagnosis not present

## 2021-10-14 DIAGNOSIS — N1832 Chronic kidney disease, stage 3b: Secondary | ICD-10-CM | POA: Diagnosis not present

## 2021-10-14 DIAGNOSIS — M48061 Spinal stenosis, lumbar region without neurogenic claudication: Secondary | ICD-10-CM | POA: Diagnosis not present

## 2021-10-14 DIAGNOSIS — I129 Hypertensive chronic kidney disease with stage 1 through stage 4 chronic kidney disease, or unspecified chronic kidney disease: Secondary | ICD-10-CM | POA: Diagnosis not present

## 2021-10-14 DIAGNOSIS — E1129 Type 2 diabetes mellitus with other diabetic kidney complication: Secondary | ICD-10-CM | POA: Diagnosis not present

## 2021-10-14 DIAGNOSIS — E1122 Type 2 diabetes mellitus with diabetic chronic kidney disease: Secondary | ICD-10-CM | POA: Diagnosis not present

## 2021-10-15 ENCOUNTER — Other Ambulatory Visit: Payer: Self-pay | Admitting: Internal Medicine

## 2021-10-15 DIAGNOSIS — N1832 Chronic kidney disease, stage 3b: Secondary | ICD-10-CM

## 2021-10-16 ENCOUNTER — Other Ambulatory Visit: Payer: Medicare Other

## 2021-10-21 ENCOUNTER — Ambulatory Visit
Admission: RE | Admit: 2021-10-21 | Discharge: 2021-10-21 | Disposition: A | Discharge status: Home / Self Care | Payer: Medicare Other | Source: Ambulatory Visit | Attending: Internal Medicine | Admitting: Internal Medicine

## 2021-10-21 DIAGNOSIS — N1832 Chronic kidney disease, stage 3b: Secondary | ICD-10-CM

## 2021-10-21 DIAGNOSIS — N183 Chronic kidney disease, stage 3 unspecified: Secondary | ICD-10-CM | POA: Diagnosis not present

## 2021-10-22 ENCOUNTER — Other Ambulatory Visit: Payer: Self-pay | Admitting: Physical Medicine and Rehabilitation

## 2021-10-22 ENCOUNTER — Other Ambulatory Visit (HOSPITAL_COMMUNITY): Payer: Self-pay | Admitting: Physical Medicine and Rehabilitation

## 2021-10-22 DIAGNOSIS — M48061 Spinal stenosis, lumbar region without neurogenic claudication: Secondary | ICD-10-CM

## 2021-10-26 ENCOUNTER — Other Ambulatory Visit (HOSPITAL_COMMUNITY): Payer: Self-pay | Admitting: Physical Medicine and Rehabilitation

## 2021-10-26 DIAGNOSIS — M48061 Spinal stenosis, lumbar region without neurogenic claudication: Secondary | ICD-10-CM

## 2021-10-27 ENCOUNTER — Other Ambulatory Visit: Payer: Self-pay

## 2021-10-27 ENCOUNTER — Other Ambulatory Visit: Payer: Self-pay | Admitting: Physical Medicine and Rehabilitation

## 2021-10-27 DIAGNOSIS — M48061 Spinal stenosis, lumbar region without neurogenic claudication: Secondary | ICD-10-CM

## 2021-10-27 NOTE — Progress Notes (Deleted)
Primary Care Physician:  Celene Squibb, MD ? ?Primary Gastroenterologist:   ? ?No chief complaint on file. ? ? ?HPI:  [name] is a 76 y.o. male here  ? ?Current Outpatient Medications  ?Medication Sig Dispense Refill  ? acetaminophen (TYLENOL) 500 MG tablet Take 500 mg by mouth every 8 (eight) hours as needed for moderate pain or headache.    ? ALPRAZolam (XANAX) 1 MG tablet Take 1 mg by mouth 3 (three) times daily.  2  ? apixaban (ELIQUIS) 5 MG TABS tablet Take 1 tablet (5 mg total) by mouth 2 (two) times daily. 60 tablet 5  ? atorvastatin (LIPITOR) 20 MG tablet Take 20 mg by mouth daily.     ? buPROPion (WELLBUTRIN SR) 100 MG 12 hr tablet Take 100 mg by mouth every morning.    ? diphenhydrAMINE (BENADRYL) 25 MG tablet Take 25 mg by mouth daily as needed for allergies.    ? furosemide (LASIX) 20 MG tablet Take 20 mg by mouth daily.    ? gabapentin (NEURONTIN) 600 MG tablet Take 600 mg by mouth with breakfast, with lunch, and with evening meal.  1  ? glimepiride (AMARYL) 2 MG tablet Take 2 mg by mouth daily.    ? isosorbide mononitrate (IMDUR) 30 MG 24 hr tablet Take 1 tablet (30 mg total) by mouth daily. 30 tablet 6  ? levothyroxine (SYNTHROID) 125 MCG tablet Take 125 mcg by mouth daily before breakfast.    ? lisinopril (ZESTRIL) 2.5 MG tablet Take 2.5 mg by mouth in the morning.    ? metFORMIN (GLUCOPHAGE) 500 MG tablet Take 1-2 tablets (500-1,000 mg total) by mouth See admin instructions. Take 1000 mg in the morning and 500 mg in the evening (Patient not taking: Reported on 08/20/2021)    ? nitroGLYCERIN (NITROSTAT) 0.4 MG SL tablet Place 0.4 mg under the tongue every 5 (five) minutes as needed for chest pain.     ? omega-3 acid ethyl esters (LOVAZA) 1 g capsule Take 2 g by mouth 2 (two) times daily.  12  ? oxyCODONE-acetaminophen (PERCOCET) 10-325 MG tablet Take 1 tablet by mouth every 4 (four) hours.  0  ? ?No current facility-administered medications for this visit.  ? ? ?Allergies as of 10/28/2021  - Review Complete 10/27/2021  ?Allergen Reaction Noted  ? Morphine Other (See Comments) 05/23/2013  ? Tape Other (See Comments) 08/11/2020  ? Sulfonamide derivatives Rash and Other (See Comments) 01/09/2009  ? ? ?Past Medical History:  ?Diagnosis Date  ? Alcohol use   ? Anxiety   ? Chest pain   ? Coronary artery disease   ? a. Nonobstructive CAD by cath 2010 with negative nuclear stress test 05/2013.  ? Depression   ? Diabetes mellitus   ? Hyperlipidemia   ? Hypertension   ? Hypothyroidism   ? Kidney problem   ? a. 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  ? Mild aortic insufficiency   ? PAT (paroxysmal atrial tachycardia) ([place])   ? Pericardial effusion   ? a. by echo 2014.  ? Pulmonary nodules   ? resolved  ? PVC's (premature ventricular contractions)   ? Stroke (cerebrum) ([place])   ? Stroke due to embolism of left carotid artery ([place]) 03/24/2016  ? Left caudate head stroke  ? Wide-complex tachycardia   ? a. per [name] note: Event monitor September 2014 sinus rhythm, occasional PVCs, nonsustained PAT, and one 7 beat run of a wide QRS complex at 110-120 bpm, with minimal  changes QRS axis, and QRS morphology not similar to documented PVCs.   ? ? ?Past Surgical History:  ?Procedure Laterality Date  ? [place]  ? BREAST LUMPECTOMY Left 1979  ? CIRCUMCISION  1980  ? MANDIBLE SURGERY Right 1969  ? PACEMAKER IMPLANT N/A 06/26/2019  ? Procedure: PACEMAKER IMPLANT;  Surgeon: Thompson Grayer, MD;  Location: [facility];  Service: Cardiovascular;  Laterality: N/A;  ? ? ?Family History  ?Problem Relation Age of Onset  ? Lung cancer Mother   ? Diabetes Father   ? Bladder Cancer Father   ? Colon cancer Brother   ? Stroke Maternal Grandmother   ? Cancer Maternal Grandfather   ? Stroke Paternal Grandmother   ? Stroke Paternal Grandfather   ? ? ?Social History  ? ?Socioeconomic History  ? Marital status: Legally Separated  ?  Spouse name: Mariann Laster  ? Number of children: 3  ? Years of education: 10  ? Highest education  level: Not on file  ?Occupational History  ? Occupation: Retired  ?Tobacco Use  ? Smoking status: Former  ?  Types: Cigarettes  ?  Quit date: 29  ?  Years since quitting: 39.2  ? Smokeless tobacco: Never  ?Vaping Use  ? Vaping Use: Never used  ?Substance and Sexual Activity  ? Alcohol use: Not Currently  ? Drug use: No  ? Sexual activity: Not on file  ?Other Topics Concern  ? Not on file  ?Social History Narrative  ? Lives at home w/ his wife  ? Right-handed  ? Caffeine: 2-3 cups of coffee each morning  ? ?Social Determinants of Health  ? ?Financial Resource Strain: Not on file  ?Food Insecurity: Not on file  ?Transportation Needs: Not on file  ?Physical Activity: Not on file  ?Stress: Not on file  ?Social Connections: Not on file  ?Intimate Partner Violence: Not on file  ? ? ?  ?ROS: ? ?General: Negative for anorexia, weight loss, fever, chills, fatigue, weakness. ?Eyes: Negative for vision changes.  ?ENT: Negative for hoarseness, difficulty swallowing , nasal congestion. ?CV: Negative for chest pain, angina, palpitations, dyspnea on exertion, peripheral edema.  ?Respiratory: Negative for dyspnea at rest, dyspnea on exertion, cough, sputum, wheezing.  ?GI: See history of present illness. ?GU:  Negative for dysuria, hematuria, urinary incontinence, urinary frequency, nocturnal urination.  ?MS: Negative for joint pain, low back pain.  ?Derm: Negative for rash or itching.  ?Neuro: Negative for weakness, abnormal sensation, seizure, frequent headaches, memory loss, confusion.  ?Psych: Negative for anxiety, depression, suicidal ideation, hallucinations.  ?Endo: Negative for unusual weight change.  ?Heme: Negative for bruising or bleeding. ?Allergy: Negative for rash or hives. ?  ? ?Physical Examination: ? ?There were no vitals taken for this visit. ?  ?General: Well-nourished, well-developed in no acute distress.  ?Head: Normocephalic, atraumatic.   ?Eyes: Conjunctiva pink, no icterus. ?Mouth: Oropharyngeal mucosa  moist and pink , no lesions erythema or exudate. ?Neck: Supple without thyromegaly, masses, or lymphadenopathy.  ?Lungs: Clear to auscultation bilaterally.  ?Heart: Regular rate and rhythm, no murmurs rubs or gallops.  ?Abdomen: Bowel sounds are normal, nontender, nondistended, no hepatosplenomegaly or masses, no abdominal bruits or    hernia , no rebound or guarding.   ?Rectal: *** ?Extremities: No lower extremity edema. No clubbing or deformities.  ?Neuro: Alert and oriented x 4 , grossly normal neurologically.  ?Skin: Warm and dry, no rash or jaundice.   ?Psych: Alert and cooperative, normal mood and affect. ? ?  Labs: ?*** ? ?Imaging Studies: ?US RENAL ? ?Result Date: 10/23/2021 ?CLINICAL DATA:  Stage 3 chronic kidney disease. EXAM: RENAL / URINARY TRACT ULTRASOUND COMPLETE COMPARISON:  PET-CT 01/31/2007. FINDINGS: Right Kidney: Not visualized. Left Kidney: Renal measurements: 13.1 x 6.1 x 6.2 cm = volume: 257 mL. Echogenicity within normal limits. No mass or hydronephrosis visualized. Bladder: Appears normal for degree of bladder distention. Other: None. IMPRESSION: 1. The right kidney is not visualized in the renal fossa on current ultrasound. 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 Consider follow-up dedicated right renal ultrasound. 2. Left kidney within normal limits. Electronically Signed   By: Ronney Asters M.D.   On: 10/23/2021 15:23   ? ? ?Assessment: ? ? ? ? ? ? ?Plan: ? ? ?

## 2021-10-28 ENCOUNTER — Inpatient Hospital Stay
Admission: RE | Admit: 2021-10-28 | Discharge: 2021-10-28 | Disposition: A | Discharge status: Home / Self Care | Payer: Medicare Other | Source: Ambulatory Visit | Attending: Physical Medicine and Rehabilitation | Admitting: Physical Medicine and Rehabilitation

## 2021-10-28 ENCOUNTER — Ambulatory Visit: Payer: Medicare Other | Admitting: Gastroenterology

## 2021-10-28 ENCOUNTER — Emergency Department (HOSPITAL_COMMUNITY): Payer: Medicare Other

## 2021-10-28 ENCOUNTER — Encounter (HOSPITAL_COMMUNITY): Payer: Self-pay

## 2021-10-28 ENCOUNTER — Other Ambulatory Visit: Payer: Self-pay

## 2021-10-28 ENCOUNTER — Encounter: Payer: Self-pay | Admitting: Internal Medicine

## 2021-10-28 ENCOUNTER — Emergency Department (HOSPITAL_COMMUNITY)
Admission: EM | Admit: 2021-10-28 | Discharge: 2021-10-28 | Disposition: A | Discharge status: Home / Self Care | Payer: Medicare Other | Attending: Emergency Medicine | Admitting: Emergency Medicine

## 2021-10-28 ENCOUNTER — Other Ambulatory Visit: Payer: Medicare Other

## 2021-10-28 DIAGNOSIS — E119 Type 2 diabetes mellitus without complications: Secondary | ICD-10-CM | POA: Insufficient documentation

## 2021-10-28 DIAGNOSIS — Z7984 Long term (current) use of oral hypoglycemic drugs: Secondary | ICD-10-CM | POA: Diagnosis not present

## 2021-10-28 DIAGNOSIS — E039 Hypothyroidism, unspecified: Secondary | ICD-10-CM | POA: Insufficient documentation

## 2021-10-28 DIAGNOSIS — N179 Acute kidney failure, unspecified: Secondary | ICD-10-CM | POA: Diagnosis not present

## 2021-10-28 DIAGNOSIS — R4182 Altered mental status, unspecified: Secondary | ICD-10-CM | POA: Insufficient documentation

## 2021-10-28 DIAGNOSIS — Z20822 Contact with and (suspected) exposure to covid-19: Secondary | ICD-10-CM | POA: Insufficient documentation

## 2021-10-28 DIAGNOSIS — E860 Dehydration: Secondary | ICD-10-CM | POA: Diagnosis not present

## 2021-10-28 DIAGNOSIS — I2510 Atherosclerotic heart disease of native coronary artery without angina pectoris: Secondary | ICD-10-CM | POA: Diagnosis not present

## 2021-10-28 DIAGNOSIS — R000 Tachycardia, unspecified: Secondary | ICD-10-CM | POA: Diagnosis not present

## 2021-10-28 DIAGNOSIS — Z79899 Other long term (current) drug therapy: Secondary | ICD-10-CM | POA: Insufficient documentation

## 2021-10-28 DIAGNOSIS — I10 Essential (primary) hypertension: Secondary | ICD-10-CM | POA: Insufficient documentation

## 2021-10-28 LAB — CBC WITH DIFFERENTIAL/PLATELET
Abs Immature Granulocytes: 0.05 10*3/uL (ref 0.00–0.07)
Basophils Absolute: 0 10*3/uL (ref 0.0–0.1)
Basophils Relative: 0 %
Eosinophils Absolute: 0.1 10*3/uL (ref 0.0–0.5)
Eosinophils Relative: 1 %
HCT: 39.7 % (ref 39.0–52.0)
Hemoglobin: 12.9 g/dL — ABNORMAL LOW (ref 13.0–17.0)
Immature Granulocytes: 1 %
Lymphocytes Relative: 6 %
Lymphs Abs: 0.6 10*3/uL — ABNORMAL LOW (ref 0.7–4.0)
MCH: 31.9 pg (ref 26.0–34.0)
MCHC: 32.5 g/dL (ref 30.0–36.0)
MCV: 98 fL (ref 80.0–100.0)
Monocytes Absolute: 0.6 10*3/uL (ref 0.1–1.0)
Monocytes Relative: 6 %
Neutro Abs: 8.5 10*3/uL — ABNORMAL HIGH (ref 1.7–7.7)
Neutrophils Relative %: 86 %
Platelets: 144 10*3/uL — ABNORMAL LOW (ref 150–400)
RBC: 4.05 MIL/uL — ABNORMAL LOW (ref 4.22–5.81)
RDW: 16.4 % — ABNORMAL HIGH (ref 11.5–15.5)
WBC: 9.9 10*3/uL (ref 4.0–10.5)
nRBC: 0 % (ref 0.0–0.2)

## 2021-10-28 LAB — RESP PANEL BY RT-PCR (FLU A&B, COVID) ARPGX2
Influenza A by PCR: NEGATIVE
Influenza B by PCR: NEGATIVE
SARS Coronavirus 2 by RT PCR: NEGATIVE

## 2021-10-28 LAB — COMPREHENSIVE METABOLIC PANEL
ALT: 28 U/L (ref 0–44)
AST: 24 U/L (ref 15–41)
Albumin: 3.6 g/dL (ref 3.5–5.0)
Alkaline Phosphatase: 78 U/L (ref 38–126)
Anion gap: 8 (ref 5–15)
BUN: 21 mg/dL (ref 8–23)
CO2: 21 mmol/L — ABNORMAL LOW (ref 22–32)
Calcium: 9.2 mg/dL (ref 8.9–10.3)
Chloride: 104 mmol/L (ref 98–111)
Creatinine, Ser: 1.81 mg/dL — ABNORMAL HIGH (ref 0.61–1.24)
GFR, Estimated: 39 mL/min — ABNORMAL LOW (ref >60)
Glucose, Bld: 215 mg/dL — ABNORMAL HIGH (ref 70–99)
Potassium: 4.8 mmol/L (ref 3.5–5.1)
Sodium: 133 mmol/L — ABNORMAL LOW (ref 135–145)
Total Bilirubin: 0.5 mg/dL (ref 0.3–1.2)
Total Protein: 7.1 g/dL (ref 6.5–8.1)

## 2021-10-28 LAB — URINALYSIS, ROUTINE W REFLEX MICROSCOPIC
Bilirubin Urine: NEGATIVE
Glucose, UA: >=500 mg/dL — AB
Hgb urine dipstick: NEGATIVE
Ketones, ur: NEGATIVE mg/dL
Leukocytes,Ua: NEGATIVE
Nitrite: NEGATIVE
Protein, ur: NEGATIVE mg/dL
Specific Gravity, Urine: 1.012 (ref 1.005–1.030)
pH: 7 (ref 5.0–8.0)

## 2021-10-28 LAB — LACTIC ACID, PLASMA: Lactic Acid, Venous: 1.9 mmol/L (ref 0.5–1.9)

## 2021-10-28 LAB — TSH: TSH: 0.384 u[IU]/mL (ref 0.350–4.500)

## 2021-10-28 MED ORDER — SODIUM CHLORIDE 0.9 % IV BOLUS
1000.0000 mL | Freq: Once | INTRAVENOUS | Status: AC
  Administered 2021-10-28: 1000 mL via INTRAVENOUS

## 2021-10-28 NOTE — ED Provider Notes (Signed)
[facility] EMERGENCY DEPARTMENT Provider Note   CSN: [id number] Arrival date & time: 10/28/21  1120     History  Chief Complaint  Patient presents with   Altered Mental Status    [name] is a 76 y.o. male.  Pt is a 76 yo male with a pmhx significant for htn, hyperlipidemia, cad, dm, hypothyroidism, cva, depression, and tachy-brady syndrome.  Pt's wife brings him in today because of AMS.  She said he had a fever over night and was confused this morning.  He was shaky when he tried to walk.  He denies any pain or discomfort.        Home Medications Prior to Admission medications   Medication Sig Start Date End Date Taking? Authorizing Provider  acetaminophen (TYLENOL) 500 MG tablet Take 500 mg by mouth every 8 (eight) hours as needed for moderate pain or headache.   Yes [provider]  ALPRAZolam Prudy Feeler) 1 MG tablet Take 1 mg by mouth 3 (three) times daily. 01/18/16  Yes [provider]  apixaban (ELIQUIS) 5 MG TABS tablet Take 1 tablet (5 mg total) by mouth 2 (two) times daily. 06/18/21  Yes BranchDorothe Pea, MD  atorvastatin (LIPITOR) 40 MG tablet Take 40 mg by mouth daily. 10/20/21  Yes [provider]  buPROPion (WELLBUTRIN SR) 100 MG 12 hr tablet Take 100 mg by mouth every morning. 11/28/19  Yes [provider]  diphenhydrAMINE (BENADRYL) 25 MG tablet Take 25 mg by mouth daily as needed for allergies.   Yes [provider]  FARXIGA 10 MG TABS tablet Take 10 mg by mouth daily. 10/20/21  Yes [provider]  fluticasone-salmeterol (ADVAIR) 250-50 MCG/ACT AEPB Inhale 1 puff into the lungs in the morning and at bedtime.   Yes [provider]  gabapentin (NEURONTIN) 600 MG tablet Take 600 mg by mouth with breakfast, with lunch, and with evening meal. 12/17/17  Yes [provider]  glimepiride (AMARYL) 2 MG tablet Take 2 mg by mouth daily. 01/29/21  Yes [provider]  isosorbide mononitrate  (IMDUR) 30 MG 24 hr tablet Take 1 tablet (30 mg total) by mouth daily. 08/20/21  Yes BranchDorothe Pea, MD  levothyroxine (SYNTHROID) 137 MCG tablet Take 137 mcg by mouth daily. 10/20/21  Yes [provider]  lisinopril (ZESTRIL) 5 MG tablet Take 5 mg by mouth daily. 10/20/21  Yes [provider]  nitroGLYCERIN (NITROSTAT) 0.4 MG SL tablet Place 0.4 mg under the tongue every 5 (five) minutes as needed for chest pain.  03/08/16  Yes [provider]  omega-3 acid ethyl esters (LOVAZA) 1 g capsule Take 2 g by mouth 2 (two) times daily. 12/17/17  Yes [provider]  omeprazole (PRILOSEC) 40 MG capsule Take 40 mg by mouth every morning. 10/20/21  Yes [provider]  oxyCODONE-acetaminophen (PERCOCET) 10-325 MG tablet Take 1 tablet by mouth every 4 (four) hours. 02/08/18  Yes [provider]  potassium chloride (MICRO-K) 10 MEQ CR capsule Take 10 mEq by mouth daily. 10/20/21  Yes [provider]  furosemide (LASIX) 20 MG tablet Take 20 mg by mouth daily. Patient not taking: Reported on 10/28/2021 11/13/20   [provider]  metFORMIN (GLUCOPHAGE) 500 MG tablet Take 1-2 tablets (500-1,000 mg total) by mouth See admin instructions. Take 1000 mg in the morning and 500 mg in the evening Patient not taking: Reported on 10/28/2021 12/07/19   Cleora Fleet, MD      Allergies  Morphine, Tape, and Sulfonamide derivatives    Review of Systems   Review of Systems  Neurological:  Positive for tremors and weakness.  All other systems reviewed and are negative.  Physical Exam Updated Vital Signs BP 124/71    Pulse 77    Temp 97.7 F (36.5 C) (Oral)    Resp 19    Ht  (1.702 m)    Wt 84.8 kg    SpO2 97%    BMI 29.29 kg/m  Physical Exam Vitals and nursing note reviewed.  Constitutional:      Appearance: Normal appearance.  HENT:     Head: Normocephalic and atraumatic.     Right Ear: External ear normal.     Left Ear: External ear  normal.     Nose: Nose normal.     Mouth/Throat:     Mouth: Mucous membranes are moist.     Pharynx: Oropharynx is clear.  Eyes:     Extraocular Movements: Extraocular movements intact.     Conjunctiva/sclera: Conjunctivae normal.     Pupils: Pupils are equal, round, and reactive to light.  Cardiovascular:     Rate and Rhythm: Normal rate and regular rhythm.     Pulses: Normal pulses.     Heart sounds: Normal heart sounds.  Pulmonary:     Effort: Pulmonary effort is normal.     Breath sounds: Normal breath sounds.  Abdominal:     General: Abdomen is flat. Bowel sounds are normal.     Palpations: Abdomen is soft.  Musculoskeletal:        General: Normal range of motion.     Cervical back: Normal range of motion and neck supple.  Skin:    General: Skin is warm.     Capillary Refill: Capillary refill takes less than 2 seconds.  Neurological:     General: No focal deficit present.     Mental Status: He is alert and oriented to person, place, and time.  Psychiatric:        Mood and Affect: Mood normal.        Behavior: Behavior normal.    ED Results / Procedures / Treatments   Labs (all labs ordered are listed, but only abnormal results are displayed) Labs Reviewed  CBC WITH DIFFERENTIAL/PLATELET - Abnormal; Notable for the following components:      Result Value   RBC 4.05 (*)    Hemoglobin 12.9 (*)    RDW 16.4 (*)    Platelets 144 (*)    Neutro Abs 8.5 (*)    Lymphs Abs 0.6 (*)    All other components within normal limits  COMPREHENSIVE METABOLIC PANEL - Abnormal; Notable for the following components:   Sodium 133 (*)    CO2 21 (*)    Glucose, Bld 215 (*)    Creatinine, Ser 1.81 (*)    GFR, Estimated 39 (*)    All other components within normal limits  URINALYSIS, ROUTINE W REFLEX MICROSCOPIC - Abnormal; Notable for the following components:   Color, Urine STRAW (*)    Glucose, UA >=500 (*)    Bacteria, UA RARE (*)    All other components within normal limits   RESP PANEL BY RT-PCR (FLU A&B, COVID) ARPGX2  LACTIC ACID, PLASMA  TSH  CBG MONITORING, ED    EKG EKG Interpretation  Date/Time:  Wednesday October 28 2021 11:38:11 EST Ventricular Rate:  88 PR Interval:  192 QRS Duration: 101 QT Interval:  354 QTC Calculation: 429 R  Axis:   -49 Text Interpretation: Sinus rhythm Left anterior fascicular block Consider anterior infarct Nonspecific T abnormalities, lateral leads Baseline wander in lead(s) V2 V5 V6 No significant change since last tracing Confirmed by Jacalyn Lefevre [phone]) on 10/28/2021 11:46:47 AM  Radiology CT Head Wo Contrast  Result Date: 10/28/2021 CLINICAL DATA:  Mental status change, unknown cause EXAM: CT HEAD WITHOUT CONTRAST TECHNIQUE: Contiguous axial images were obtained from the base of the skull through the vertex without intravenous contrast. RADIATION DOSE REDUCTION: This exam was performed according to the departmental dose-optimization program which includes automated exposure control, adjustment of the mA and/or kV according to patient size and/or use of iterative reconstruction technique. COMPARISON:  Report from 03/05/2018 (images not available at the time of dictation) FINDINGS: Brain: No evidence of acute large vascular territory infarction, hemorrhage, hydrocephalus, extra-axial collection or mass lesion/mass effect. Small remote appearing infarcts in the left thalamus, left caudate, and right cerebellum. Vascular: No hyperdense vessel identified. Calcific intracranial atherosclerosis. Skull: No acute fracture. Sinuses/Orbits: Mild ethmoid air cell mucosal thickening. Otherwise, clear sinuses. Unremarkable orbits. Other: No mastoid effusions. IMPRESSION: 1. No evidence of acute intracranial abnormality. 2. Small remote appearing infarcts in the left thalamus, left caudate, and right cerebellum. MRI could provide more sensitive evaluation for acute infarct if clinically indicated. Electronically Signed   By: Feliberto Harts  M.D.   On: 10/28/2021 13:20   DG Chest Portable 1 View  Result Date: 10/28/2021 CLINICAL DATA:  Altered mental status EXAM: PORTABLE CHEST 1 VIEW COMPARISON:  08/11/2020 FINDINGS: Slightly increased interstitial prominence. No pleural effusion or pneumothorax. Stable cardiomegaly. Left chest wall pacemaker. IMPRESSION: Slightly increased interstitial prominence may reflect chronic changes. Mild edema or viral pneumonia possible in the appropriate setting. Electronically Signed   By: Guadlupe Spanish M.D.   On: 10/28/2021 12:24    Procedures Procedures    Medications Ordered in ED Medications  sodium chloride 0.9 % bolus 1,000 mL (0 mLs Intravenous Stopped 10/28/21 1452)    ED Course/ Medical Decision Making/ A&P                           Medical Decision Making Amount and/or Complexity of Data Reviewed Labs: ordered. Radiology: ordered.   This patient presents to the ED for concern of ams, this involves an extensive number of treatment options, and is a complaint that carries with it a high risk of complications and morbidity.  The differential diagnosis includes infection, electrolyte abn, cva   Co morbidities that complicate the patient evaluation  htn, hyperlipidemia, cad, dm, hypothyroidism, cva, depression, and tachy-brady syndrome   Additional history obtained:  Additional history obtained from epic chart review External records from outside source obtained and reviewed including wife   Lab Tests:  I Ordered, and personally interpreted labs.  The pertinent results include:  cbc with chronic anemia, cmp with bs of 215 and Cr of 1.81 which slightly more elevated.  Covid/flu neg, TSH nl.  UA neg other than spilling some glucose.   Imaging Studies ordered:  I ordered imaging studies including cxr  I independently visualized and interpreted imaging which showed    IMPRESSION:  Slightly increased interstitial prominence may reflect chronic  changes. Mild edema or viral  pneumonia possible in the appropriate  setting.     CT head;   IMPRESSION:  1. No evidence of acute intracranial abnormality.  2. Small remote appearing infarcts in the left thalamus, left  caudate, and right  cerebellum. MRI could provide more sensitive  evaluation for acute infarct if clinically indicated.   I agree with the radiologist interpretation   Cardiac Monitoring:  The patient was maintained on a cardiac monitor.  I personally viewed and interpreted the cardiac monitored which showed an underlying rhythm of: nsr   Medicines ordered and prescription drug management:  I ordered medication including IVFs  for dehydration  Reevaluation of the patient after these medicines showed that the patient improved I have reviewed the patients home medicines and have made adjustments as needed   Test Considered:  MRI, but he is neurologically baseline   Critical Interventions:  IVFs   Problem List / ED Course:  Mild aki:  pt given ivfs AMS:  pt is back to normal.  Pt is able to ambulate. Possible fever at home:  No fever here.  No meds given for fever at home. No source of infection. Sob:  pt able to ambulate, but O2 sats dropped after walking down the hall.  They went back to normal when he stopped.  Pt said this always happens.  He rarely walks due to sob.     Reevaluation:  After the interventions noted above, I reevaluated the patient and found that they have :improved   Social Determinants of Health:  Lives at home with wife   Dispostion:  After consideration of the diagnostic results and the patients response to treatment, I feel that the patent would benefit from discharge with outpatient f/u.          Final Clinical Impression(s) / ED Diagnoses Final diagnoses:  Dehydration  AKI (acute kidney injury) [facility](HCC)    Rx / DC Orders ED Discharge Orders     None         Jacalyn LefevreHaviland, Catelyn Friel, MD 10/28/21 1454

## 2021-10-28 NOTE — ED Triage Notes (Addendum)
Pt bib by wife for altered mental status this morning.  States that he seems more confused this morning. Reports shaking this morning when trying to walk.  Reports increased generalized weakness.  No unilateral weakness noted. No facial droop or aphasia.  Alert and oriented to person and time.  Able to move all extremities.  Neg on stroke scale.   ?

## 2021-10-28 NOTE — Discharge Instructions (Signed)

## 2021-10-28 NOTE — ED Notes (Signed)
Patient ambulated well in the hallway with no assistance. O2 stats dropped to 72 for 15 seconds and returned to 96 on the way back to the room.  ?

## 2021-11-02 ENCOUNTER — Encounter (HOSPITAL_COMMUNITY): Payer: Self-pay

## 2021-11-02 ENCOUNTER — Other Ambulatory Visit (HOSPITAL_COMMUNITY): Payer: Medicare Other

## 2021-11-19 ENCOUNTER — Ambulatory Visit (INDEPENDENT_AMBULATORY_CARE_PROVIDER_SITE_OTHER): Payer: Medicare Other

## 2021-11-19 DIAGNOSIS — I495 Sick sinus syndrome: Secondary | ICD-10-CM

## 2021-11-19 LAB — CUP PACEART REMOTE DEVICE CHECK
Battery Remaining Longevity: 104 mo
Battery Remaining Percentage: 85 %
Battery Voltage: 3.01 V
Brady Statistic AP VP Percent: 1 %
Brady Statistic AP VS Percent: 15 %
Brady Statistic AS VP Percent: 1 %
Brady Statistic AS VS Percent: 84 %
Brady Statistic RA Percent Paced: 15 %
Brady Statistic RV Percent Paced: 1 %
Date Time Interrogation Session: 20230330020013
Implantable Lead Implant Date: 20201103
Implantable Lead Implant Date: 20201103
Implantable Lead Location: 753859
Implantable Lead Location: 753860
Implantable Pulse Generator Implant Date: 20201103
Lead Channel Impedance Value: 400 Ohm
Lead Channel Impedance Value: 460 Ohm
Lead Channel Pacing Threshold Amplitude: 0.5 V
Lead Channel Pacing Threshold Amplitude: 0.5 V
Lead Channel Pacing Threshold Pulse Width: 0.5 ms
Lead Channel Pacing Threshold Pulse Width: 0.5 ms
Lead Channel Sensing Intrinsic Amplitude: 1.6 mV
Lead Channel Sensing Intrinsic Amplitude: 9.9 mV
Lead Channel Setting Pacing Amplitude: 2 V
Lead Channel Setting Pacing Amplitude: 2.5 V
Lead Channel Setting Pacing Pulse Width: 0.5 ms
Lead Channel Setting Sensing Sensitivity: 2 mV
Pulse Gen Model: 2272
Pulse Gen Serial Number: 9174806

## 2021-12-01 NOTE — Progress Notes (Signed)
Remote pacemaker transmission.   

## 2021-12-07 ENCOUNTER — Ambulatory Visit
Admission: RE | Admit: 2021-12-07 | Discharge: 2021-12-07 | Disposition: A | Discharge status: Home / Self Care | Payer: Medicare Other | Source: Ambulatory Visit | Attending: Physical Medicine and Rehabilitation | Admitting: Physical Medicine and Rehabilitation

## 2021-12-07 DIAGNOSIS — M48061 Spinal stenosis, lumbar region without neurogenic claudication: Secondary | ICD-10-CM

## 2021-12-07 DIAGNOSIS — M4316 Spondylolisthesis, lumbar region: Secondary | ICD-10-CM | POA: Diagnosis not present

## 2021-12-07 MED ORDER — MEPERIDINE HCL 50 MG/ML IJ SOLN: 50.0000 mg | Freq: Once | INTRAMUSCULAR | Status: DC | PRN

## 2021-12-07 MED ORDER — ONDANSETRON HCL 4 MG/2ML IJ SOLN: 4.0000 mg | Freq: Once | INTRAMUSCULAR | Status: DC | PRN

## 2021-12-07 MED ORDER — IOPAMIDOL (ISOVUE-M 200) INJECTION 41%
20.0000 mL | Freq: Once | INTRAMUSCULAR | Status: AC
  Administered 2021-12-07: 20 mL via INTRATHECAL

## 2021-12-07 MED ORDER — DIAZEPAM 5 MG PO TABS
5.0000 mg | ORAL_TABLET | Freq: Once | ORAL | Status: AC
Start: 2021-12-07 — End: 2021-12-07
  Administered 2021-12-07: 5 mg via ORAL

## 2021-12-07 NOTE — Discharge Instructions (Signed)

## 2021-12-09 ENCOUNTER — Other Ambulatory Visit: Payer: Self-pay | Admitting: Cardiology

## 2021-12-09 DIAGNOSIS — I480 Paroxysmal atrial fibrillation: Secondary | ICD-10-CM

## 2021-12-09 NOTE — Telephone Encounter (Signed)
Prescription refill request for Eliquis received. ?Indication: PAF ?Last office visit: 08/20/21  Zandra Abts MD ?Scr: 1.81 on 10/28/21 ?Age: 76 ?Weight: 82.8kg ? ?Based on above findings Eliquis 5mg  twice daily is the appropriate dose.  Refill approved. ? ?

## 2021-12-17 ENCOUNTER — Ambulatory Visit (HOSPITAL_COMMUNITY)
Admission: RE | Admit: 2021-12-17 | Discharge: 2021-12-17 | Disposition: A | Discharge status: Home / Self Care | Payer: Medicare Other | Source: Ambulatory Visit | Attending: Physical Medicine and Rehabilitation | Admitting: Physical Medicine and Rehabilitation

## 2021-12-17 DIAGNOSIS — M48061 Spinal stenosis, lumbar region without neurogenic claudication: Secondary | ICD-10-CM | POA: Diagnosis not present

## 2021-12-17 DIAGNOSIS — M5126 Other intervertebral disc displacement, lumbar region: Secondary | ICD-10-CM | POA: Diagnosis not present

## 2021-12-17 MED ORDER — GADOBUTROL 1 MMOL/ML IV SOLN
8.5000 mL | Freq: Once | INTRAVENOUS | Status: AC | PRN
  Administered 2021-12-17: 8.5 mL via INTRAVENOUS

## 2021-12-17 NOTE — Progress Notes (Signed)
Per order, Changed device settings for MRI to  ?DOO at 80 bpm  ?Will program device back to pre-MRI settings after completion of exam, and send transmission ?

## 2021-12-18 ENCOUNTER — Encounter: Payer: Self-pay | Admitting: Cardiology

## 2021-12-18 ENCOUNTER — Ambulatory Visit (INDEPENDENT_AMBULATORY_CARE_PROVIDER_SITE_OTHER): Payer: Medicare Other | Admitting: Cardiology

## 2021-12-18 VITALS — BP 118/68 | HR 65 | Ht 68.0 in | Wt 183.2 lb

## 2021-12-18 DIAGNOSIS — Z950 Presence of cardiac pacemaker: Secondary | ICD-10-CM | POA: Diagnosis not present

## 2021-12-18 DIAGNOSIS — I4891 Unspecified atrial fibrillation: Secondary | ICD-10-CM | POA: Diagnosis not present

## 2021-12-18 DIAGNOSIS — D6869 Other thrombophilia: Secondary | ICD-10-CM

## 2021-12-18 DIAGNOSIS — R0789 Other chest pain: Secondary | ICD-10-CM

## 2021-12-18 NOTE — Patient Instructions (Addendum)
Medication Instructions:  Continue all current medications.   Labwork: none  Testing/Procedures: none  Follow-Up: 6 months   Any Other Special Instructions Will Be Listed Below (If Applicable).   If you need a refill on your cardiac medications before your next appointment, please call your pharmacy.  

## 2021-12-18 NOTE — Progress Notes (Signed)
? ? ? ?Clinical Summary ?[name] is a 76 y.o.male seen today for follow up of the following medical problems.  ?  ?1. PAF ?- noted during 10/2018 admission ?- had not been on anticoag due to frequent falls previously.  ?  ?- since pacemaker placement falls have resolved, started on eliquis 5mg  bid.  ?  ?  ?- no palpitations ?- compliant with meds ?  ?2. Bradycardia w/ pacemaker ?- chronic according ot notes. 10/2018 admissoin had some rates to 40s and 50s ?- heart monitor showed bradycardia and post termination pauses up to 3.2 seconds ?  ? 06/26/19 pacemaker [employer] Jude medical assurity dual chamber placed ?  ? 10/2021 normal device function.  ? ? ? ?  ?3.Chest pain ?- extensive prior workup including cath in 2010 and stress test 2014 ?- admit 10/2018 with negative enzymes and echo ?- 04/2019 stress test no ischemia ?  ?-seen in ER yesterday with chest pain ?- trop neg x 2. EKG SR, no acute ischemic changes ?- came one at rest, felt nauseous and layed down and chest started hurting. Midleft chest, stabbing pain, 5/10 in severity. Not positional. +SOB. Pain lasted about 1 hour, self resolved. ?  ?11/2020 nuclear stress: inferior/inferoseptal infarct. Mild to mod peri-infarct ischemia apical.   ?-started on imdur 15 mg daily ?Some ongoing chest pressure at times. Better with NG ?- no recent chest pains.  ?  ? 4. Orthostatic dizziness ?-Improved since stopping lisinoopril ?  ?- rare infrequent symptoms.   ?  ?  ?  ?Prior cardiology notes ?  ?Prevoiusly followed by [facility] cardiology. From notes had normal cardiac monitor 2016. echocardiogram October 2014 ejection fraction 50-55%, G1DD, mild left atrial margin, 1+ AR, 1+ TR, no significant pericardial effusion ?-Echocardiogram interpreted by Dr. Alyse Low [facility] office), September 2014, preserved ejection fraction, small pericardial effusion, 1+ MR, 2+ AR  ?-Event monitor September 2014 sinus rhythm, occasional PVCs, nonsustained PAT, and one 7 beat run of a wide QRS complex at  110-120 bpm, with minimal changes QRS axis, and QRS morphology not similar to documented PVCs.  ?2. Heart catheterization 2010 nonobstructive coronary disease  ?-exercise Cardiolite stress test negative for ischemia with ejection fraction 61% October 2014 ?Past Medical History:  ?Diagnosis Date  ? Alcohol use   ? Anxiety   ? Chest pain   ? Coronary artery disease   ? a. Nonobstructive CAD by cath 2010 with negative nuclear stress test 05/2013.  ? Depression   ? Diabetes mellitus   ? Hyperlipidemia   ? Hypertension   ? Hypothyroidism   ? Kidney problem   ? a. 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  ? Mild aortic insufficiency   ? PAT (paroxysmal atrial tachycardia) ([place])   ? Pericardial effusion   ? a. by echo 2014.  ? Pulmonary nodules   ? resolved  ? PVC's (premature ventricular contractions)   ? Stroke (cerebrum) ([place])   ? Stroke due to embolism of left carotid artery ([place]) 03/24/2016  ? Left caudate head stroke  ? Wide-complex tachycardia   ? a. per [name] note: Event monitor September 2014 sinus rhythm, occasional PVCs, nonsustained PAT, and one 7 beat run of a wide QRS complex at 110-120 bpm, with minimal changes QRS axis, and QRS morphology not similar to documented PVCs.   ? ? ? ?Allergies  ?Allergen Reactions  ? Morphine Other (See Comments)  ?  Affected the heart- was told to not take this again  ? Tape Other (See  Comments)  ?  Certain tapes TEAR THE SKIN  ? Sulfonamide Derivatives Rash and Other (See Comments)  ?  Blistering on the skin  ? ? ? ?Current Outpatient Medications  ?Medication Sig Dispense Refill  ? acetaminophen (TYLENOL) 500 MG tablet Take 500 mg by mouth every 8 (eight) hours as needed for moderate pain or headache.    ? ALPRAZolam (XANAX) 1 MG tablet Take 1 mg by mouth 3 (three) times daily.  2  ? apixaban (ELIQUIS) 5 MG TABS tablet TAKE 1 TABLET BY MOUTH TWICE DAILY 60 tablet 11  ? atorvastatin (LIPITOR) 40 MG tablet Take 40 mg by mouth daily.    ? buPROPion (WELLBUTRIN SR) 100 MG 12 hr  tablet Take 100 mg by mouth every morning.    ? diphenhydrAMINE (BENADRYL) 25 MG tablet Take 25 mg by mouth daily as needed for allergies.    ? FARXIGA 10 MG TABS tablet Take 10 mg by mouth daily.    ? fluticasone-salmeterol (ADVAIR) 250-50 MCG/ACT AEPB Inhale 1 puff into the lungs in the morning and at bedtime.    ? furosemide (LASIX) 20 MG tablet Take 20 mg by mouth daily. (Patient not taking: Reported on 10/28/2021)    ? gabapentin (NEURONTIN) 600 MG tablet Take 600 mg by mouth with breakfast, with lunch, and with evening meal.  1  ? glimepiride (AMARYL) 2 MG tablet Take 2 mg by mouth daily.    ? isosorbide mononitrate (IMDUR) 30 MG 24 hr tablet Take 1 tablet (30 mg total) by mouth daily. 30 tablet 6  ? levothyroxine (SYNTHROID) 137 MCG tablet Take 137 mcg by mouth daily.    ? lisinopril (ZESTRIL) 5 MG tablet Take 5 mg by mouth daily.    ? metFORMIN (GLUCOPHAGE) 500 MG tablet Take 1-2 tablets (500-1,000 mg total) by mouth See admin instructions. Take 1000 mg in the morning and 500 mg in the evening (Patient not taking: Reported on 10/28/2021)    ? nitroGLYCERIN (NITROSTAT) 0.4 MG SL tablet Place 0.4 mg under the tongue every 5 (five) minutes as needed for chest pain.     ? omega-3 acid ethyl esters (LOVAZA) 1 g capsule Take 2 g by mouth 2 (two) times daily.  12  ? omeprazole (PRILOSEC) 40 MG capsule Take 40 mg by mouth every morning.    ? oxyCODONE-acetaminophen (PERCOCET) 10-325 MG tablet Take 1 tablet by mouth every 4 (four) hours.  0  ? potassium chloride (MICRO-K) 10 MEQ CR capsule Take 10 mEq by mouth daily.    ? ?No current facility-administered medications for this visit.  ? ? ? ?Past Surgical History:  ?Procedure Laterality Date  ? [place]  ? BREAST LUMPECTOMY Left 1979  ? CIRCUMCISION  1980  ? MANDIBLE SURGERY Right 1969  ? PACEMAKER IMPLANT N/A 06/26/2019  ? Procedure: PACEMAKER IMPLANT;  Surgeon: Thompson Grayer, MD;  Location: [place] [facility];  Service: Cardiovascular;  Laterality: N/A;   ? ? ? ?Allergies  ?Allergen Reactions  ? Morphine Other (See Comments)  ?  Affected the heart- was told to not take this again  ? Tape Other (See Comments)  ?  Certain tapes TEAR THE SKIN  ? Sulfonamide Derivatives Rash and Other (See Comments)  ?  Blistering on the skin  ? ? ? ? ?Family History  ?Problem Relation Age of Onset  ? Lung cancer Mother   ? Diabetes Father   ? Bladder Cancer Father   ? Colon cancer Brother   ?  Stroke Maternal Grandmother   ? Cancer Maternal Grandfather   ? Stroke Paternal Grandmother   ? Stroke Paternal Grandfather   ? ? ? ?Social History ?[name] reports that he quit smoking about 39 years ago. His smoking use included cigarettes. He has never used smokeless tobacco. ?[name] reports that he does not currently use alcohol. ? ? ?Review of Systems ?CONSTITUTIONAL: No weight loss, fever, chills, weakness or fatigue.  ?HEENT: Eyes: No visual loss, blurred vision, double vision or yellow sclerae.No hearing loss, sneezing, congestion, runny nose or sore throat.  ?SKIN: No rash or itching.  ?CARDIOVASCULAR: per hpi ?RESPIRATORY: No shortness of breath, cough or sputum.  ?GASTROINTESTINAL: No anorexia, nausea, vomiting or diarrhea. No abdominal pain or blood.  ?GENITOURINARY: No burning on urination, no polyuria ?NEUROLOGICAL: No headache, dizziness, syncope, paralysis, ataxia, numbness or tingling in the extremities. No change in bowel or bladder control.  ?MUSCULOSKELETAL: No muscle, back pain, joint pain or stiffness.  ?LYMPHATICS: No enlarged nodes. No history of splenectomy.  ?PSYCHIATRIC: No history of depression or anxiety.  ?ENDOCRINOLOGIC: No reports of sweating, cold or heat intolerance. No polyuria or polydipsia.  ?. ? ? ?Physical Examination ?Today's Vitals  ? 12/18/21 1349  ?BP: 118/68  ?Pulse: 65  ?SpO2: 97%  ?Weight: 183 lb 3.2 oz (83.1 kg)  ?Height: 5\' 8"  (1.727 m)  ? ?Body mass index is 27.86 kg/m?. ? ?Gen: resting comfortably, no acute distress ?HEENT: no scleral  icterus, pupils equal round and reactive, no palptable cervical adenopathy,  ?CV: RRR, no mr/g no jvd ?Resp: Clear to auscultation bilaterally ?GI: abdomen is soft, non-tender, non-distended, normal bowel so

## 2021-12-21 DIAGNOSIS — E1165 Type 2 diabetes mellitus with hyperglycemia: Secondary | ICD-10-CM | POA: Diagnosis not present

## 2021-12-21 DIAGNOSIS — E039 Hypothyroidism, unspecified: Secondary | ICD-10-CM | POA: Diagnosis not present

## 2021-12-21 DIAGNOSIS — N1832 Chronic kidney disease, stage 3b: Secondary | ICD-10-CM | POA: Diagnosis not present

## 2021-12-24 DIAGNOSIS — I129 Hypertensive chronic kidney disease with stage 1 through stage 4 chronic kidney disease, or unspecified chronic kidney disease: Secondary | ICD-10-CM | POA: Diagnosis not present

## 2021-12-24 DIAGNOSIS — M545 Low back pain, unspecified: Secondary | ICD-10-CM | POA: Diagnosis not present

## 2021-12-24 DIAGNOSIS — G894 Chronic pain syndrome: Secondary | ICD-10-CM | POA: Diagnosis not present

## 2021-12-24 DIAGNOSIS — E039 Hypothyroidism, unspecified: Secondary | ICD-10-CM | POA: Diagnosis not present

## 2021-12-24 DIAGNOSIS — D696 Thrombocytopenia, unspecified: Secondary | ICD-10-CM | POA: Diagnosis not present

## 2021-12-24 DIAGNOSIS — E1165 Type 2 diabetes mellitus with hyperglycemia: Secondary | ICD-10-CM | POA: Diagnosis not present

## 2021-12-24 DIAGNOSIS — E1142 Type 2 diabetes mellitus with diabetic polyneuropathy: Secondary | ICD-10-CM | POA: Diagnosis not present

## 2021-12-24 DIAGNOSIS — K219 Gastro-esophageal reflux disease without esophagitis: Secondary | ICD-10-CM | POA: Diagnosis not present

## 2021-12-24 DIAGNOSIS — R600 Localized edema: Secondary | ICD-10-CM | POA: Diagnosis not present

## 2021-12-24 DIAGNOSIS — E782 Mixed hyperlipidemia: Secondary | ICD-10-CM | POA: Diagnosis not present

## 2022-01-08 ENCOUNTER — Other Ambulatory Visit: Payer: Self-pay | Admitting: Cardiology

## 2022-01-11 DIAGNOSIS — D649 Anemia, unspecified: Secondary | ICD-10-CM | POA: Diagnosis not present

## 2022-01-12 DIAGNOSIS — M48062 Spinal stenosis, lumbar region with neurogenic claudication: Secondary | ICD-10-CM | POA: Diagnosis not present

## 2022-01-22 ENCOUNTER — Encounter: Payer: Medicare Other | Admitting: Internal Medicine

## 2022-01-29 ENCOUNTER — Other Ambulatory Visit (INDEPENDENT_AMBULATORY_CARE_PROVIDER_SITE_OTHER): Payer: Medicare Other

## 2022-01-29 ENCOUNTER — Encounter: Payer: Self-pay | Admitting: Internal Medicine

## 2022-01-29 ENCOUNTER — Ambulatory Visit (INDEPENDENT_AMBULATORY_CARE_PROVIDER_SITE_OTHER): Payer: Medicare Other | Admitting: Internal Medicine

## 2022-01-29 VITALS — BP 114/62 | HR 63 | Ht 68.0 in | Wt 183.0 lb

## 2022-01-29 DIAGNOSIS — I480 Paroxysmal atrial fibrillation: Secondary | ICD-10-CM

## 2022-01-29 DIAGNOSIS — R0789 Other chest pain: Secondary | ICD-10-CM | POA: Diagnosis not present

## 2022-01-29 DIAGNOSIS — R001 Bradycardia, unspecified: Secondary | ICD-10-CM

## 2022-01-29 DIAGNOSIS — I10 Essential (primary) hypertension: Secondary | ICD-10-CM

## 2022-01-29 LAB — CUP PACEART INCLINIC DEVICE CHECK
Battery Remaining Longevity: 117 mo
Battery Voltage: 3.01 V
Brady Statistic RA Percent Paced: 15 %
Brady Statistic RV Percent Paced: 0.38 %
Date Time Interrogation Session: 20230609154250
Implantable Lead Implant Date: 20201103
Implantable Lead Implant Date: 20201103
Implantable Lead Location: 753859
Implantable Lead Location: 753860
Implantable Pulse Generator Implant Date: 20201103
Lead Channel Impedance Value: 425 Ohm
Lead Channel Impedance Value: 475 Ohm
Lead Channel Pacing Threshold Amplitude: 0.5 V
Lead Channel Pacing Threshold Amplitude: 0.5 V
Lead Channel Pacing Threshold Amplitude: 0.75 V
Lead Channel Pacing Threshold Amplitude: 0.75 V
Lead Channel Pacing Threshold Pulse Width: 0.5 ms
Lead Channel Pacing Threshold Pulse Width: 0.5 ms
Lead Channel Pacing Threshold Pulse Width: 0.5 ms
Lead Channel Pacing Threshold Pulse Width: 0.5 ms
Lead Channel Sensing Intrinsic Amplitude: 1.8 mV
Lead Channel Sensing Intrinsic Amplitude: 12 mV
Lead Channel Setting Pacing Amplitude: 2 V
Lead Channel Setting Pacing Amplitude: 2.5 V
Lead Channel Setting Pacing Pulse Width: 0.5 ms
Lead Channel Setting Sensing Sensitivity: 2 mV
Pulse Gen Model: 2272
Pulse Gen Serial Number: 9174806

## 2022-01-29 NOTE — Patient Instructions (Signed)
Medication Instructions:  Your physician recommends that you continue on your current medications as directed. Please refer to the Current Medication list given to you today.  *If you need a refill on your cardiac medications before your next appointment, please call your pharmacy*   Lab Work: None ordered.  If you have labs (blood work) drawn today and your tests are completely normal, you will receive your results only by: MyChart Message (if you have MyChart) OR A paper copy in the mail If you have any lab test that is abnormal or we need to change your treatment, we will call you to review the results.   Testing/Procedures: None ordered.    Follow-Up: At [facility], you and your health needs are our priority.  As part of our continuing mission to provide you with exceptional heart care, we have created designated Provider Care Teams.  These Care Teams include your primary Cardiologist (physician) and Advanced Practice Providers (APPs -  Physician Assistants and Nurse Practitioners) who all work together to provide you with the care you need, when you need it.  We recommend signing up for the patient portal called "MyChart".  Sign up information is provided on this After Visit Summary.  MyChart is used to connect with patients for Virtual Visits (Telemedicine).  Patients are able to view lab/test results, encounter notes, upcoming appointments, etc.  Non-urgent messages can be sent to your provider as well.   To learn more about what you can do with MyChart, go to [email or web address].    Your next appointment:   12 months with Dr Johney Frame

## 2022-01-29 NOTE — Progress Notes (Signed)
PCP: Benita Stabile, MD Primary Cardiologist: Dr Wyline Mood Primary EP:  Dr Johney Frame  [name] is a 76 y.o. male who presents today for routine electrophysiology followup.  Since last being seen in our clinic, the patient reports doing reasonably well.  He has chronic stable chest pain.  He recently saw Dr Wyline Mood for this.  He has had prior workup.  He does not wish for further testing or medicine changes today.  He is not very active.  Unaware of afib.  Today, he denies symptoms of palpitations,  shortness of breath,  lower extremity edema, dizziness, presyncope, or syncope.  The patient is otherwise without complaint today.   Past Medical History:  Diagnosis Date   Alcohol use    Anxiety    Chest pain    Coronary artery disease    a. Nonobstructive CAD by cath 2010 with negative nuclear stress test 05/2013.   Depression    Diabetes mellitus    Hyperlipidemia    Hypertension    Hypothyroidism    Kidney problem    a. 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   Mild aortic insufficiency    PAT (paroxysmal atrial tachycardia) (HCC)    Pericardial effusion    a. by echo 2014.   Pulmonary nodules    resolved   PVC's (premature ventricular contractions)    Stroke (cerebrum) (HCC)    Stroke due to embolism of left carotid artery (HCC) 03/24/2016   Left caudate head stroke   Wide-complex tachycardia    a. per [name] note: Event monitor September 2014 sinus rhythm, occasional PVCs, nonsustained PAT, and one 7 beat run of a wide QRS complex at 110-120 bpm, with minimal changes QRS axis, and QRS morphology not similar to documented PVCs.    Past Surgical History:  Procedure Laterality Date   BACK SURGERY  1996   BREAST LUMPECTOMY Left 1979   CIRCUMCISION  1980   MANDIBLE SURGERY Right 1969   PACEMAKER IMPLANT N/A 06/26/2019   Procedure: PACEMAKER IMPLANT;  Surgeon: Hillis Range, MD;  Location: [name];  Service: Cardiovascular;  Laterality: N/A;    ROS- all systems  are reviewed and negative except as per HPI above  Current Outpatient Medications  Medication Sig Dispense Refill   ACCU-CHEK GUIDE test strip USE TO TEST TWICE DAILY     acetaminophen (TYLENOL) 500 MG tablet Take 500 mg by mouth every 8 (eight) hours as needed for moderate pain or headache.     ALPRAZolam (XANAX) 1 MG tablet Take 1 mg by mouth 3 (three) times daily.  2   apixaban (ELIQUIS) 5 MG TABS tablet TAKE 1 TABLET BY MOUTH TWICE DAILY 60 tablet 11   atorvastatin (LIPITOR) 40 MG tablet Take 40 mg by mouth daily.     buPROPion (WELLBUTRIN SR) 100 MG 12 hr tablet Take 100 mg by mouth every morning.     diphenhydrAMINE (BENADRYL) 25 MG tablet Take 25 mg by mouth daily as needed for allergies.     FARXIGA 10 MG TABS tablet Take 10 mg by mouth daily.     fluticasone-salmeterol (ADVAIR) 250-50 MCG/ACT AEPB Inhale 1 puff into the lungs as needed.     furosemide (LASIX) 20 MG tablet Take 20 mg by mouth daily.     gabapentin (NEURONTIN) 600 MG tablet Take 600 mg by mouth with breakfast, with lunch, and with evening meal.  1   glimepiride (AMARYL) 2 MG tablet Take 2 mg by mouth daily.  isosorbide mononitrate (IMDUR) 30 MG 24 hr tablet TAKE 1 TABLET BY MOUTH EVERY DAY 90 tablet 1   levothyroxine (SYNTHROID) 137 MCG tablet Take 137 mcg by mouth daily.     lisinopril (ZESTRIL) 5 MG tablet Take 5 mg by mouth daily.     metFORMIN (GLUCOPHAGE) 500 MG tablet Take 1-2 tablets (500-1,000 mg total) by mouth See admin instructions. Take 1000 mg in the morning and 500 mg in the evening     nitroGLYCERIN (NITROSTAT) 0.4 MG SL tablet Place 0.4 mg under the tongue every 5 (five) minutes as needed for chest pain.      omega-3 acid ethyl esters (LOVAZA) 1 g capsule Take 2 g by mouth 2 (two) times daily.  12   omeprazole (PRILOSEC) 40 MG capsule Take 40 mg by mouth every morning.     oxyCODONE-acetaminophen (PERCOCET) 10-325 MG tablet Take 1 tablet by mouth every 4 (four) hours.  0   potassium chloride  (MICRO-K) 10 MEQ CR capsule Take 10 mEq by mouth daily.     No current facility-administered medications for this visit.    Physical Exam: Vitals:   01/29/22 1035  Weight: 183 lb (83 kg)  Height: 5\' 8"  (1.727 m)    GEN- The patient is well appearing, alert and oriented x 3 today.   Head- normocephalic, atraumatic Eyes-  Sclera clear, conjunctiva pink Ears- hearing intact Oropharynx- clear Lungs- Clear to ausculation bilaterally, normal work of breathing Chest- pacemaker pocket is well healed Heart- Regular rate and rhythm, no murmurs, rubs or gallops, PMI not laterally displaced GI- soft, NT, ND, + BS Extremities- no clubbing, cyanosis, or edema  Pacemaker interrogation- reviewed in detail today,  See PACEART report  ekg tracing ordered today is personally reviewed and shows sinus, no ischemic changes  Assessment and Plan:  1. Symptomatic sinus bradycardia with post termination pauses Normal pacemaker function See Pace Art report No changes today he is not device dependant today  2. Paroxysmal atrial fibrillation asymptomatic Afib burden is <1 % (previously 12%, prior 6.2%) On eliquis  3. HTN Stable No change required today   4. Chest pain Dr note reviewed This is a very chronic issue No recent changes Pt does not wish for further testing or medicine changes today  Return for further device follow-up in a year  Verna Czech MD, [facility] 01/29/2022 10:45 AM

## 2022-02-06 NOTE — Progress Notes (Unsigned)
.  Referring Provider: Dr. Nita Armstrong Primary Care Physician:  Ronald Stabile, MD Primary Gastroenterologist:  Dr. Jena Armstrong  No chief complaint on file.   HPI:   [name] is a 76 y.o. male presenting today at the request of Dr. Margo Armstrong for consult colonoscopy.   Today:     Mild normocytic anemia  Thrombocytopenia:   Past Medical History:  Diagnosis Date   Alcohol use    Anxiety    Chest pain    Coronary artery disease    a. Nonobstructive CAD by cath 2010 with negative nuclear stress test 05/2013.   Depression    Diabetes mellitus    Hyperlipidemia    Hypertension    Hypothyroidism    Kidney problem    a. 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   Mild aortic insufficiency    PAT (paroxysmal atrial tachycardia) (HCC)    Pericardial effusion    a. by echo 2014.   Pulmonary nodules    resolved   PVC's (premature ventricular contractions)    Stroke (cerebrum) (HCC)    Stroke due to embolism of left carotid artery (HCC) 03/24/2016   Left caudate head stroke   Wide-complex tachycardia    a. per [name] note: Event monitor September 2014 sinus rhythm, occasional PVCs, nonsustained PAT, and one 7 beat run of a wide QRS complex at 110-120 bpm, with minimal changes QRS axis, and QRS morphology not similar to documented PVCs.     Past Surgical History:  Procedure Laterality Date   BACK SURGERY  1996   BREAST LUMPECTOMY Left 1979   CIRCUMCISION  1980   MANDIBLE SURGERY Right 1969   PACEMAKER IMPLANT N/A 06/26/2019   Procedure: PACEMAKER IMPLANT;  Surgeon: Ronald Range, MD;  Location: [name];  Service: Cardiovascular;  Laterality: N/A;    Current Outpatient Medications  Medication Sig Dispense Refill   ACCU-CHEK GUIDE test strip USE TO TEST TWICE DAILY     acetaminophen (TYLENOL) 500 MG tablet Take 500 mg by mouth every 8 (eight) hours as needed for moderate pain or headache.     ALPRAZolam (XANAX) 1 MG tablet Take 1 mg by mouth 3 (three) times daily.  2    apixaban (ELIQUIS) 5 MG TABS tablet TAKE 1 TABLET BY MOUTH TWICE DAILY 60 tablet 11   atorvastatin (LIPITOR) 40 MG tablet Take 40 mg by mouth daily.     buPROPion (WELLBUTRIN SR) 100 MG 12 hr tablet Take 100 mg by mouth every morning.     diphenhydrAMINE (BENADRYL) 25 MG tablet Take 25 mg by mouth daily as needed for allergies.     FARXIGA 10 MG TABS tablet Take 10 mg by mouth daily.     fluticasone-salmeterol (ADVAIR) 250-50 MCG/ACT AEPB Inhale 1 puff into the lungs as needed.     furosemide (LASIX) 20 MG tablet Take 20 mg by mouth daily.     gabapentin (NEURONTIN) 600 MG tablet Take 600 mg by mouth with breakfast, with lunch, and with evening meal.  1   glimepiride (AMARYL) 2 MG tablet Take 2 mg by mouth daily.     isosorbide mononitrate (IMDUR) 30 MG 24 hr tablet TAKE 1 TABLET BY MOUTH EVERY DAY 90 tablet 1   levothyroxine (SYNTHROID) 137 MCG tablet Take 137 mcg by mouth daily.     lisinopril (ZESTRIL) 5 MG tablet Take 5 mg by mouth daily.     metFORMIN (GLUCOPHAGE) 500 MG tablet Take 1-2 tablets (500-1,000 mg total) by mouth  See admin instructions. Take 1000 mg in the morning and 500 mg in the evening     nitroGLYCERIN (NITROSTAT) 0.4 MG SL tablet Place 0.4 mg under the tongue every 5 (five) minutes as needed for chest pain.      omega-3 acid ethyl esters (LOVAZA) 1 g capsule Take 2 g by mouth 2 (two) times daily.  12   omeprazole (PRILOSEC) 40 MG capsule Take 40 mg by mouth every morning.     oxyCODONE-acetaminophen (PERCOCET) 10-325 MG tablet Take 1 tablet by mouth every 4 (four) hours.  0   potassium chloride (MICRO-K) 10 MEQ CR capsule Take 10 mEq by mouth daily.     No current facility-administered medications for this visit.    Allergies as of 02/08/2022 - Review Complete 01/29/2022  Allergen Reaction Noted   Morphine Other (See Comments) 05/23/2013   Tape Other (See Comments) 08/11/2020   Sulfonamide derivatives Rash and Other (See Comments) 01/09/2009    Family History   Problem Relation Age of Onset   Lung cancer Mother    Diabetes Father    Bladder Cancer Father    Colon cancer Brother    Stroke Maternal Grandmother    Cancer Maternal Grandfather    Stroke Paternal Grandmother    Stroke Paternal Grandfather     Social History   Socioeconomic History   Marital status: Legally Separated    Spouse name: [name]   Number of children: 3   Years of education: 10   Highest education level: Not on file  Occupational History   Occupation: Retired  Tobacco Use   Smoking status: Former    Types: Cigarettes    Quit date: 1984    Years since quitting: 39.4   Smokeless tobacco: Never  Vaping Use   Vaping Use: Never used  Substance and Sexual Activity   Alcohol use: Not Currently   Drug use: No   Sexual activity: Not on file  Other Topics Concern   Not on file  Social History Narrative   Lives at home w/ his wife   Right-handed   Caffeine: 2-3 cups of coffee each morning   Social Determinants of Health   Financial Resource Strain: Not on file  Food Insecurity: No Food Insecurity (12/11/2019)   Hunger Vital Sign    Worried About Running Out of Food in the Last Year: Never true    Ran Out of Food in the Last Year: Never true  Transportation Needs: No Transportation Needs (12/11/2019)   PRAPARE - Administrator, Civil Service (Medical): No    Lack of Transportation (Non-Medical): No  Physical Activity: Not on file  Stress: Not on file  Social Connections: Not on file  Intimate Partner Violence: Not on file    Review of Systems: Gen: Denies any fever, chills, fatigue, weight loss, lack of appetite.  CV: Denies chest pain, heart palpitations, peripheral edema, syncope.  Resp: Denies shortness of breath at rest or with exertion. Denies wheezing or cough.  GI: Denies dysphagia or odynophagia. Denies jaundice, hematemesis, fecal incontinence. GU : Denies urinary burning, urinary frequency, urinary hesitancy MS: Denies joint pain,  muscle weakness, cramps, or limitation of movement.  Derm: Denies rash, itching, dry skin Psych: Denies depression, anxiety, memory loss, and confusion Heme: Denies bruising, bleeding, and enlarged lymph nodes.  Physical Exam: There were no vitals taken for this visit. General:   Alert and oriented. Pleasant and cooperative. Well-nourished and well-developed.  Head:  Normocephalic and atraumatic. Eyes:  Without icterus, sclera clear and conjunctiva pink.  Ears:  Normal auditory acuity. Lungs:  Clear to auscultation bilaterally. No wheezes, rales, or rhonchi. No distress.  Heart:  S1, S2 present without murmurs appreciated.  Abdomen:  +BS, soft, non-tender and non-distended. No HSM noted. No guarding or rebound. No masses appreciated.  Rectal:  Deferred  Msk:  Symmetrical without gross deformities. Normal posture. Extremities:  Without edema. Neurologic:  Alert and  oriented x4;  grossly normal neurologically. Skin:  Intact without significant lesions or rashes. Psych:  Alert and cooperative. Normal mood and affect.    Assessment:  76 y.o. male with history of bradycardia s/p pancemaker, a fib on Eliquis, HTN, stroke, CKD, diabetes,    Plan:  ***   Ermalinda Memos, PA-C [facility] 02/08/2022

## 2022-02-08 ENCOUNTER — Telehealth: Payer: Self-pay | Admitting: *Deleted

## 2022-02-08 ENCOUNTER — Encounter: Payer: Self-pay | Admitting: Gastroenterology

## 2022-02-08 ENCOUNTER — Ambulatory Visit (INDEPENDENT_AMBULATORY_CARE_PROVIDER_SITE_OTHER): Payer: Medicare Other | Admitting: Gastroenterology

## 2022-02-08 ENCOUNTER — Encounter: Payer: Self-pay | Admitting: *Deleted

## 2022-02-08 VITALS — BP 150/63 | HR 72 | Temp 97.6°F | Ht 68.0 in | Wt 182.4 lb

## 2022-02-08 DIAGNOSIS — K5900 Constipation, unspecified: Secondary | ICD-10-CM | POA: Diagnosis not present

## 2022-02-08 DIAGNOSIS — D696 Thrombocytopenia, unspecified: Secondary | ICD-10-CM | POA: Diagnosis not present

## 2022-02-08 DIAGNOSIS — R1310 Dysphagia, unspecified: Secondary | ICD-10-CM | POA: Diagnosis not present

## 2022-02-08 DIAGNOSIS — Z86010 Personal history of colonic polyps: Secondary | ICD-10-CM | POA: Diagnosis not present

## 2022-02-08 DIAGNOSIS — R1031 Right lower quadrant pain: Secondary | ICD-10-CM

## 2022-02-08 DIAGNOSIS — D649 Anemia, unspecified: Secondary | ICD-10-CM

## 2022-02-08 DIAGNOSIS — R1011 Right upper quadrant pain: Secondary | ICD-10-CM | POA: Diagnosis not present

## 2022-02-08 NOTE — Telephone Encounter (Signed)
Attention: Preop   We would like to request holding the following medication for patient please.  Procedure: Colonoscopy and Upper Endoscopy   Date: TBD  Medication to hold: Eliquis for 48 hours prior to procedure   Surgeon: Dr. Jena Gauss   Phone: [phone]  Fax:  [phone]  Type of Anesthesia: Propofol  ASA III   Need cardiac clearance due to chronic chest pain.

## 2022-02-08 NOTE — Telephone Encounter (Signed)
Sent cardiac clearance and clearance to hold Eliquis. Waiting on approval.

## 2022-02-08 NOTE — Patient Instructions (Addendum)
Please have blood work completed at [facility].  We will arrange for you to have a CT scan of your abdomen and pelvis to further evaluate your abdominal pain.  If you have any severe abdominal pain or develop fever, proceed to the emergency room.  We will arrange for you to have a colonoscopy and upper endoscopy with possible stretching of your esophagus in the near future with Dr. Jena Gauss at [facility].  - We are reaching out to your cardiologist to get cardiac clearance and approval to hold Eliquis for 2 days prior to your procedure. - 1 day prior to procedure: Take Marcelline Deist as prescribed, take one half dose of glimepiride. - Day of procedure: Do not take any morning diabetes medications. - Monitor your blood sugars closely while you are on clear liquids for your colonoscopy and correct any low blood sugars with approved sugary clear liquids.  For constipation: Start MiraLAX 1 capful (17 g) daily in 8 ounces of water, coffee, juice, or other noncarbonated beverage. Please call with a progress report in 3 to 4 weeks. As we discussed, it is important that your constipation is well managed before your colonoscopy to ensure that you have an adequate colon prep.   We will plan to follow-up with you in the office after your colonoscopy and upper endoscopy.  Do not hesitate to call if you have any questions or concerns prior to your next visit.  It was good to meet you today!   Ermalinda Memos, PA-C [facility]

## 2022-02-08 NOTE — Telephone Encounter (Signed)
   Primary Cardiologist: Dina Rich, MD  Chart reviewed as part of pre-operative protocol coverage. Given past medical history and time since last visit, based on ACC/AHA guidelines, ARISTON GRANDISON would be at acceptable risk for the planned procedure without further cardiovascular testing.   Patient with diagnosis of afib on Eliquis for anticoagulation.     Procedure: colonoscopy and upper endoscopy Date of procedure: TBD   CHA2DS2-VASc Score = 6  This indicates a 9.7% annual risk of stroke. The patient's score is based upon: CHF History: 0 HTN History: 1 Diabetes History: 1 Stroke History: 2 Vascular Disease History: 1 Age Score: 1 Gender Score: 0   CrCl 31mL/min Platelet count 144K   Recommend that pt only hold Eliquis for 1 day prior to procedure if possible given elevated CV risk including prior stroke. He should resume anticoag as soon as safely after his procedure.  I will route this recommendation to the requesting party via Epic fax function and remove from pre-op pool.  Please call with questions.  Thomasene Ripple. Andron Marrazzo NP-C    02/08/2022, 3:13 PM [facility] [address] Office [phone] Fax [phone]

## 2022-02-08 NOTE — Telephone Encounter (Signed)
Received medication clearance, to hold Eliquis for 1 day only, due to prior stroke. Copy placed on providers desk.

## 2022-02-08 NOTE — Telephone Encounter (Signed)
Patient with diagnosis of afib on Eliquis for anticoagulation.    Procedure: colonoscopy and upper endoscopy Date of procedure: TBD  CHA2DS2-VASc Score = 6  This indicates a 9.7% annual risk of stroke. The patient's score is based upon: CHF History: 0 HTN History: 1 Diabetes History: 1 Stroke History: 2 Vascular Disease History: 1 Age Score: 1 Gender Score: 0   CrCl 79mL/min Platelet count 144K  Recommend that pt only hold Eliquis for 1 day prior to procedure if possible given elevated CV risk including prior stroke. He should resume anticoag as soon as safely after his procedure.

## 2022-02-09 ENCOUNTER — Ambulatory Visit (HOSPITAL_COMMUNITY)
Admission: RE | Admit: 2022-02-09 | Discharge: 2022-02-09 | Disposition: A | Discharge status: Home / Self Care | Payer: Medicare Other | Source: Ambulatory Visit | Attending: Gastroenterology | Admitting: Gastroenterology

## 2022-02-09 DIAGNOSIS — R1011 Right upper quadrant pain: Secondary | ICD-10-CM | POA: Diagnosis not present

## 2022-02-09 DIAGNOSIS — D696 Thrombocytopenia, unspecified: Secondary | ICD-10-CM | POA: Diagnosis not present

## 2022-02-09 DIAGNOSIS — R1031 Right lower quadrant pain: Secondary | ICD-10-CM | POA: Insufficient documentation

## 2022-02-09 DIAGNOSIS — D649 Anemia, unspecified: Secondary | ICD-10-CM | POA: Diagnosis not present

## 2022-02-09 DIAGNOSIS — I700 Atherosclerosis of aorta: Secondary | ICD-10-CM | POA: Diagnosis not present

## 2022-02-09 LAB — POCT I-STAT CREATININE: Creatinine, Ser: 1.6 mg/dL — ABNORMAL HIGH (ref 0.61–1.24)

## 2022-02-09 MED ORDER — IOHEXOL 300 MG/ML  SOLN
100.0000 mL | Freq: Once | INTRAMUSCULAR | Status: AC | PRN
  Administered 2022-02-09: 80 mL via INTRAVENOUS

## 2022-02-09 NOTE — Telephone Encounter (Signed)
Dr. Jena Gauss, patient needs EGD with possible dilation due to dysphagia. Are you ok with dilating the esophagus with holding Eliquis x 1 day?

## 2022-02-10 NOTE — Telephone Encounter (Signed)
Noted. Will call pt to schedule once we have his future schedule

## 2022-02-10 NOTE — Telephone Encounter (Signed)
Reviewed.  Please note we have also received medical cardiac clearance.  See separate telephone note from 6/19.  Mindy: Please proceed with scheduling colonoscopy and upper endoscopy with possible dilation with Dr. Jena Gauss as planned. Hold Eliquis x1 day prior to procedure. You should have separate instructions for diabetes medication adjustments. Should also have instruction for patient to take Linzess 145 mcg x 4 days prior to starting colon prep due to hx of constipation.

## 2022-02-12 ENCOUNTER — Telehealth: Payer: Self-pay | Admitting: Gastroenterology

## 2022-02-13 LAB — IRON,TIBC AND FERRITIN PANEL
%SAT: 26 % (calc) (ref 20–48)
Ferritin: 24 ng/mL (ref 24–380)
Iron: 87 ug/dL (ref 50–180)
TIBC: 334 mcg/dL (calc) (ref 250–425)

## 2022-02-13 LAB — COMPLETE METABOLIC PANEL WITH GFR
AG Ratio: 1.4 (calc) (ref 1.0–2.5)
ALT: 7 U/L — ABNORMAL LOW (ref 9–46)
AST: 11 U/L (ref 10–35)
Albumin: 4 g/dL (ref 3.6–5.1)
Alkaline phosphatase (APISO): 91 U/L (ref 35–144)
BUN/Creatinine Ratio: 10 (calc) (ref 6–22)
BUN: 15 mg/dL (ref 7–25)
CO2: 24 mmol/L (ref 20–32)
Calcium: 9.3 mg/dL (ref 8.6–10.3)
Chloride: 106 mmol/L (ref 98–110)
Creat: 1.44 mg/dL — ABNORMAL HIGH (ref 0.70–1.28)
Globulin: 2.8 g/dL (calc) (ref 1.9–3.7)
Glucose, Bld: 52 mg/dL — ABNORMAL LOW (ref 65–99)
Potassium: 5 mmol/L (ref 3.5–5.3)
Sodium: 140 mmol/L (ref 135–146)
Total Bilirubin: 0.5 mg/dL (ref 0.2–1.2)
Total Protein: 6.8 g/dL (ref 6.1–8.1)
eGFR: 51 mL/min/{1.73_m2} — ABNORMAL LOW (ref >=60)

## 2022-02-13 LAB — CBC WITH DIFFERENTIAL/PLATELET
Absolute Monocytes: 573 cells/uL (ref 200–950)
Basophils Absolute: 32 cells/uL (ref 0–200)
Basophils Relative: 0.5 %
Eosinophils Absolute: 221 cells/uL (ref 15–500)
Eosinophils Relative: 3.5 %
HCT: 38.5 % (ref 38.5–50.0)
Hemoglobin: 12.9 g/dL — ABNORMAL LOW (ref 13.2–17.1)
Lymphs Abs: 1783 cells/uL (ref 850–3900)
MCH: 33.9 pg — ABNORMAL HIGH (ref 27.0–33.0)
MCHC: 33.5 g/dL (ref 32.0–36.0)
MCV: 101.3 fL — ABNORMAL HIGH (ref 80.0–100.0)
MPV: 11.5 fL (ref 7.5–12.5)
Monocytes Relative: 9.1 %
Neutro Abs: 3692 cells/uL (ref 1500–7800)
Neutrophils Relative %: 58.6 %
Platelets: 172 10*3/uL (ref 140–400)
RBC: 3.8 10*6/uL — ABNORMAL LOW (ref 4.20–5.80)
RDW: 15.4 % — ABNORMAL HIGH (ref 11.0–15.0)
Total Lymphocyte: 28.3 %
WBC: 6.3 10*3/uL (ref 3.8–10.8)

## 2022-02-13 LAB — TEST AUTHORIZATION 2

## 2022-02-13 LAB — B12 AND FOLATE PANEL
Folate: 15.9 ng/mL
Vitamin B-12: 175 pg/mL — ABNORMAL LOW (ref 200–1100)

## 2022-02-18 ENCOUNTER — Ambulatory Visit (INDEPENDENT_AMBULATORY_CARE_PROVIDER_SITE_OTHER): Payer: Medicare Other

## 2022-02-18 DIAGNOSIS — I495 Sick sinus syndrome: Secondary | ICD-10-CM | POA: Diagnosis not present

## 2022-02-18 LAB — CUP PACEART REMOTE DEVICE CHECK
Battery Remaining Longevity: 103 mo
Battery Remaining Percentage: 83 %
Battery Voltage: 3.01 V
Brady Statistic AP VP Percent: 1 %
Brady Statistic AP VS Percent: 13 %
Brady Statistic AS VP Percent: 1 %
Brady Statistic AS VS Percent: 87 %
Brady Statistic RA Percent Paced: 12 %
Brady Statistic RV Percent Paced: 1 %
Date Time Interrogation Session: 20230629021053
Implantable Lead Implant Date: 20201103
Implantable Lead Implant Date: 20201103
Implantable Lead Location: 753859
Implantable Lead Location: 753860
Implantable Pulse Generator Implant Date: 20201103
Lead Channel Impedance Value: 410 Ohm
Lead Channel Impedance Value: 460 Ohm
Lead Channel Pacing Threshold Amplitude: 0.5 V
Lead Channel Pacing Threshold Amplitude: 0.75 V
Lead Channel Pacing Threshold Pulse Width: 0.5 ms
Lead Channel Pacing Threshold Pulse Width: 0.5 ms
Lead Channel Sensing Intrinsic Amplitude: 1 mV
Lead Channel Sensing Intrinsic Amplitude: 10.1 mV
Lead Channel Setting Pacing Amplitude: 2 V
Lead Channel Setting Pacing Amplitude: 2.5 V
Lead Channel Setting Pacing Pulse Width: 0.5 ms
Lead Channel Setting Sensing Sensitivity: 2 mV
Pulse Gen Model: 2272
Pulse Gen Serial Number: 9174806

## 2022-02-18 NOTE — Telephone Encounter (Signed)
LMOVM to call back to schedule 

## 2022-03-01 ENCOUNTER — Encounter: Payer: Self-pay | Admitting: *Deleted

## 2022-03-08 ENCOUNTER — Encounter: Payer: Self-pay | Admitting: *Deleted

## 2022-03-08 MED ORDER — PEG 3350-KCL-NA BICARB-NACL 420 G PO SOLR: ORAL | 0 refills | Status: DC

## 2022-03-08 NOTE — Addendum Note (Signed)
Addended by: Armstead Peaks on: 03/08/2022 03:44 PM   Modules accepted: Orders

## 2022-03-08 NOTE — Progress Notes (Signed)
Remote pacemaker transmission.   

## 2022-03-08 NOTE — Telephone Encounter (Signed)
Pt returned call. He has been scheduled for tcs/egd+/-dil w/ Dr. Jena Gauss, ASA 3 on 8/4 at 8:15am. Aware will mail instructions/pre-op. Rx sent to pharmacy. Aware to hold eliquis 1 day prior.   PA done via [facility] Auth# [id number], DOS: Mar 26, 2022 - Jun 24, 2022

## 2022-03-16 NOTE — Telephone Encounter (Signed)
Is he referring to needing Linzess prior to his colon prep or to take on a regular basis? At his OV I recommended MiraLAX for constipation and Linzess x 4 days prior to his colon prep for colonoscopy. Is he taking MiraLAX daily? Is this helping with constipation?

## 2022-03-16 NOTE — Telephone Encounter (Signed)
Pt LMOVM that Linzess 145 mcg needed to be sent in. Please call @ [phone]

## 2022-03-16 NOTE — Telephone Encounter (Signed)
Spoke to pt, informed that a sample of Linzess will be at the desk for pick up. Pt voiced understanding.

## 2022-03-17 NOTE — Telephone Encounter (Signed)
Noted informed pt.  

## 2022-03-17 NOTE — Telephone Encounter (Signed)
Spoke to pt, he informed me that he is taking MiraLax daily and it is helping with his constipation.

## 2022-03-17 NOTE — Telephone Encounter (Signed)
Noted. So the Linzess samples are to be taken daily x 4 days prior to starting his colon prep.

## 2022-03-22 DIAGNOSIS — E1122 Type 2 diabetes mellitus with diabetic chronic kidney disease: Secondary | ICD-10-CM | POA: Diagnosis not present

## 2022-03-22 DIAGNOSIS — I129 Hypertensive chronic kidney disease with stage 1 through stage 4 chronic kidney disease, or unspecified chronic kidney disease: Secondary | ICD-10-CM | POA: Diagnosis not present

## 2022-03-22 DIAGNOSIS — E782 Mixed hyperlipidemia: Secondary | ICD-10-CM | POA: Diagnosis not present

## 2022-03-22 DIAGNOSIS — N1832 Chronic kidney disease, stage 3b: Secondary | ICD-10-CM | POA: Diagnosis not present

## 2022-03-24 ENCOUNTER — Other Ambulatory Visit (HOSPITAL_COMMUNITY): Payer: Medicare Other

## 2022-03-24 ENCOUNTER — Encounter (HOSPITAL_COMMUNITY)
Admission: RE | Admit: 2022-03-24 | Discharge: 2022-03-24 | Disposition: A | Discharge status: Home / Self Care | Payer: Medicare Other | Source: Ambulatory Visit | Attending: Internal Medicine | Admitting: Internal Medicine

## 2022-03-24 DIAGNOSIS — E039 Hypothyroidism, unspecified: Secondary | ICD-10-CM | POA: Diagnosis not present

## 2022-03-24 DIAGNOSIS — I129 Hypertensive chronic kidney disease with stage 1 through stage 4 chronic kidney disease, or unspecified chronic kidney disease: Secondary | ICD-10-CM | POA: Diagnosis not present

## 2022-03-24 NOTE — Patient Instructions (Signed)
Your procedure is scheduled on: 03/26/2022  Report to [facility] Main Entrance at   6:45  AM.  Call this number if you have problems the morning of surgery: [phone]   Remember:              Follow Directions on the letter you received from Your Physician's office regarding the Bowel Prep              No Smoking the day of Procedure :   Take these medicines the morning of surgery with A SIP OF WATER: xanax,neurotin, imdur, synthroid, prilosec,   Do not wear jewelry, make-up or nail polish.    Do not bring valuables to the hospital.  Contacts, dentures or bridgework may not be worn into surgery.  .   Patients discharged the day of surgery will not be allowed to drive home.     Colonoscopy, Adult, Care After This sheet gives you information about how to care for yourself after your procedure. Your health care provider may also give you more specific instructions. If you have problems or questions, contact your health care provider. What can I expect after the procedure? After the procedure, it is common to have: A small amount of blood in your stool for 24 hours after the procedure. Some gas. Mild abdominal cramping or bloating.  Follow these instructions at home: General instructions  For the first 24 hours after the procedure: Do not drive or use machinery. Do not sign important documents. Do not drink alcohol. Do your regular daily activities at a slower pace than normal. Eat soft, easy-to-digest foods. Rest often. Take over-the-counter or prescription medicines only as told by your health care provider. It is up to you to get the results of your procedure. Ask your health care provider, or the department performing the procedure, when your results will be ready. Relieving cramping and bloating Try walking around when you have cramps or feel bloated. Apply heat to your abdomen as told by your health care provider. Use a heat source that your health care provider  recommends, such as a moist heat pack or a heating pad. Place a towel between your skin and the heat source. Leave the heat on for 20-30 minutes. Remove the heat if your skin turns bright red. This is especially important if you are unable to feel pain, heat, or cold. You may have a greater risk of getting burned. Eating and drinking Drink enough fluid to keep your urine clear or pale yellow. Resume your normal diet as instructed by your health care provider. Avoid heavy or fried foods that are hard to digest. Avoid drinking alcohol for as long as instructed by your health care provider. Contact a health care provider if: You have blood in your stool 2-3 days after the procedure. Get help right away if: You have more than a small spotting of blood in your stool. You pass large blood clots in your stool. Your abdomen is swollen. You have nausea or vomiting. You have a fever. You have increasing abdominal pain that is not relieved with medicine. This information is not intended to replace advice given to you by your health care provider. Make sure you discuss any questions you have with your health care provider. Document Released: 03/23/2004 Document Revised: 05/03/2016 Document Reviewed: 10/21/2015 Elsevier Interactive Patient Education  2018 [employer]. Upper Endoscopy, Adult, Care After This sheet gives you information about how to care for yourself after your procedure. Your health care provider  may also give you more specific instructions. If you have problems or questions, contact your health care provider. What can I expect after the procedure? After the procedure, it is common to have: A sore throat. Mild stomach pain or discomfort. Bloating. Nausea. Follow these instructions at home:  Follow instructions from your health care provider about what to eat or drink after your procedure. Return to your normal activities as told by your health care provider. Ask your health care  provider what activities are safe for you. Take over-the-counter and prescription medicines only as told by your health care provider. If you were given a sedative during the procedure, it can affect you for several hours. Do not drive or operate machinery until your health care provider says that it is safe. Keep all follow-up visits as told by your health care provider. This is important. Contact a health care provider if you have: A sore throat that lasts longer than one day. Trouble swallowing. Get help right away if: You vomit blood or your vomit looks like coffee grounds. You have: A fever. Bloody, black, or tarry stools. A severe sore throat or you cannot swallow. Difficulty breathing. Severe pain in your chest or abdomen. Summary After the procedure, it is common to have a sore throat, mild stomach discomfort, bloating, and nausea. If you were given a sedative during the procedure, it can affect you for several hours. Do not drive or operate machinery until your health care provider says that it is safe. Follow instructions from your health care provider about what to eat or drink after your procedure. Return to your normal activities as told by your health care provider. This information is not intended to replace advice given to you by your health care provider. Make sure you discuss any questions you have with your health care provider. Document Revised: 06/15/2019 Document Reviewed: 01/09/2018 Elsevier Patient Education  2023 [employer].

## 2022-03-26 ENCOUNTER — Ambulatory Visit (HOSPITAL_BASED_OUTPATIENT_CLINIC_OR_DEPARTMENT_OTHER): Payer: Medicare Other | Admitting: Anesthesiology

## 2022-03-26 ENCOUNTER — Encounter (HOSPITAL_COMMUNITY)
Admission: RE | Disposition: A | Discharge status: Home / Self Care | Payer: Self-pay | Source: Home / Self Care | Attending: Internal Medicine

## 2022-03-26 ENCOUNTER — Ambulatory Visit (HOSPITAL_COMMUNITY)
Admission: RE | Admit: 2022-03-26 | Discharge: 2022-03-26 | Disposition: A | Discharge status: Home / Self Care | Payer: Medicare Other | Attending: Internal Medicine | Admitting: Internal Medicine

## 2022-03-26 ENCOUNTER — Encounter (HOSPITAL_COMMUNITY): Payer: Self-pay | Admitting: Internal Medicine

## 2022-03-26 ENCOUNTER — Ambulatory Visit (HOSPITAL_COMMUNITY): Payer: Medicare Other | Admitting: Anesthesiology

## 2022-03-26 DIAGNOSIS — Z7984 Long term (current) use of oral hypoglycemic drugs: Secondary | ICD-10-CM

## 2022-03-26 DIAGNOSIS — Z1211 Encounter for screening for malignant neoplasm of colon: Secondary | ICD-10-CM | POA: Diagnosis not present

## 2022-03-26 DIAGNOSIS — E1122 Type 2 diabetes mellitus with diabetic chronic kidney disease: Secondary | ICD-10-CM

## 2022-03-26 DIAGNOSIS — I2510 Atherosclerotic heart disease of native coronary artery without angina pectoris: Secondary | ICD-10-CM | POA: Diagnosis not present

## 2022-03-26 DIAGNOSIS — Z09 Encounter for follow-up examination after completed treatment for conditions other than malignant neoplasm: Secondary | ICD-10-CM | POA: Diagnosis not present

## 2022-03-26 DIAGNOSIS — Z7901 Long term (current) use of anticoagulants: Secondary | ICD-10-CM | POA: Insufficient documentation

## 2022-03-26 DIAGNOSIS — I4891 Unspecified atrial fibrillation: Secondary | ICD-10-CM | POA: Diagnosis not present

## 2022-03-26 DIAGNOSIS — Q438 Other specified congenital malformations of intestine: Secondary | ICD-10-CM | POA: Diagnosis not present

## 2022-03-26 DIAGNOSIS — Z86010 Personal history of colonic polyps: Secondary | ICD-10-CM

## 2022-03-26 DIAGNOSIS — R1310 Dysphagia, unspecified: Secondary | ICD-10-CM

## 2022-03-26 DIAGNOSIS — N183 Chronic kidney disease, stage 3 unspecified: Secondary | ICD-10-CM | POA: Diagnosis not present

## 2022-03-26 DIAGNOSIS — I10 Essential (primary) hypertension: Secondary | ICD-10-CM | POA: Insufficient documentation

## 2022-03-26 DIAGNOSIS — I129 Hypertensive chronic kidney disease with stage 1 through stage 4 chronic kidney disease, or unspecified chronic kidney disease: Secondary | ICD-10-CM

## 2022-03-26 DIAGNOSIS — E119 Type 2 diabetes mellitus without complications: Secondary | ICD-10-CM | POA: Diagnosis not present

## 2022-03-26 DIAGNOSIS — Z87891 Personal history of nicotine dependence: Secondary | ICD-10-CM | POA: Insufficient documentation

## 2022-03-26 DIAGNOSIS — K449 Diaphragmatic hernia without obstruction or gangrene: Secondary | ICD-10-CM | POA: Diagnosis not present

## 2022-03-26 DIAGNOSIS — Z800 Family history of malignant neoplasm of digestive organs: Secondary | ICD-10-CM | POA: Diagnosis not present

## 2022-03-26 DIAGNOSIS — I471 Supraventricular tachycardia: Secondary | ICD-10-CM | POA: Diagnosis not present

## 2022-03-26 DIAGNOSIS — E039 Hypothyroidism, unspecified: Secondary | ICD-10-CM | POA: Insufficient documentation

## 2022-03-26 HISTORY — PX: MALONEY DILATION: SHX5535

## 2022-03-26 HISTORY — PX: ESOPHAGOGASTRODUODENOSCOPY (EGD) WITH PROPOFOL: SHX5813

## 2022-03-26 HISTORY — PX: COLONOSCOPY WITH PROPOFOL: SHX5780

## 2022-03-26 LAB — GLUCOSE, CAPILLARY: Glucose-Capillary: 180 mg/dL — ABNORMAL HIGH (ref 70–99)

## 2022-03-26 SURGERY — COLONOSCOPY WITH PROPOFOL
Anesthesia: General

## 2022-03-26 MED ORDER — LIDOCAINE HCL (CARDIAC) PF 100 MG/5ML IV SOSY
PREFILLED_SYRINGE | INTRAVENOUS | Status: DC | PRN
  Administered 2022-03-26: 50 mg via INTRATRACHEAL

## 2022-03-26 MED ORDER — PROPOFOL 500 MG/50ML IV EMUL
INTRAVENOUS | Status: DC | PRN
  Administered 2022-03-26: 150 ug/kg/min via INTRAVENOUS

## 2022-03-26 MED ORDER — LACTATED RINGERS IV SOLN
INTRAVENOUS | Status: DC
Start: 2022-03-26 — End: 2022-03-26

## 2022-03-26 MED ORDER — PROPOFOL 10 MG/ML IV BOLUS
INTRAVENOUS | Status: DC | PRN
  Administered 2022-03-26: 10 mg via INTRAVENOUS
  Administered 2022-03-26: 80 mg via INTRAVENOUS

## 2022-03-26 MED ORDER — PHENYLEPHRINE 80 MCG/ML (10ML) SYRINGE FOR IV PUSH (FOR BLOOD PRESSURE SUPPORT)
PREFILLED_SYRINGE | INTRAVENOUS | Status: DC | PRN
  Administered 2022-03-26 (×2): 80 ug via INTRAVENOUS

## 2022-03-26 NOTE — Op Note (Signed)
[facility] Patient Name: [name] Procedure Date: 03/26/2022 7:58 AM MRN: [id number] Date of Birth: Jan 08, 1946 Attending MD: Norvel Richards , MD CSN: [id number] Age: 76 Admit Type: Outpatient Procedure:                Colonoscopy Indications:              High risk colon cancer surveillance: Personal                            history of colonic polyps Providers:                Norvel Richards, MD, Rosina Lowenstein, RN, Starla Link RN, RN, Raphael Gibney, Technician,                            Ladoris Gene, Technician Referring MD:              Medicines:                Propofol per Anesthesia Complications:            No immediate complications. Estimated Blood Loss:     Estimated blood loss: none. Procedure:                Pre-Anesthesia Assessment:                           - Prior to the procedure, a History and Physical                            was performed, and patient medications and                            allergies were reviewed. The patient's tolerance of                            previous anesthesia was also reviewed. The risks                            and benefits of the procedure and the sedation                            options and risks were discussed with the patient.                            All questions were answered, and informed consent                            was obtained. Prior Anticoagulants: The patient                            last took Eliquis (apixaban) 2 days prior to the  procedure. ASA Grade Assessment: III - A patient                            with severe systemic disease. After reviewing the                            risks and benefits, the patient was deemed in                            satisfactory condition to undergo the procedure.                           After obtaining informed consent, the colonoscope                            was passed under  direct vision. Throughout the                            procedure, the patient's blood pressure, pulse, and                            oxygen saturations were monitored continuously. The                            [id number] scope was introduced through the                            anus and advanced to the 10 cm into the ileum. The                            colonoscopy was performed without difficulty. The                            patient tolerated the procedure well. The quality                            of the bowel preparation was adequate. The terminal                            ileum, ileocecal valve, appendiceal orifice, and                            rectum and the ileocecal valve, appendiceal                            orifice, and rectum were photographed. The entire                            colon was well visualized. Scope In: 8:27:00 AM Scope Out: 8:40:14 AM Scope Withdrawal Time: 0 hours 8 minutes 30 seconds  Total Procedure Duration: 0 hours 13 minutes 14 seconds  Findings:      The perianal and digital rectal examinations were normal. Elongated and       redundant colon  requiring external abdominal pressure to reach the cecum.      The colon (entire examined portion) appeared normal.      The retroflexed view of the distal rectum and anal verge was normal and       showed no anal or rectal abnormalities. Impression:               - The entire examined colon is normal.                           - The distal rectum and anal verge are normal on                            retroflexion view. Redundant colon.                           - No specimens collected. Moderate Sedation:      Moderate (conscious) sedation was personally administered by an       anesthesia professional. The following parameters were monitored: oxygen       saturation, heart rate, blood pressure, respiratory rate, EKG, adequacy       of pulmonary ventilation, and response to  care. Recommendation:           - Patient has a contact number available for                            emergencies. The signs and symptoms of potential                            delayed complications were discussed with the                            patient. Return to normal activities tomorrow.                            Written discharge instructions were provided to the                            patient.                           - Resume previous diet.                           - Repeat colonoscopy in 5 years for surveillance.                            See EGD report.                           - Return to GI office (date not yet determined). Procedure Code(s):        --- Professional ---                           [phone], Colonoscopy, flexible; diagnostic, including  collection of specimen(s) by brushing or washing,                            when performed (separate procedure) Diagnosis Code(s):        --- Professional ---                           Z86.010, Personal history of colonic polyps CPT copyright 2019 American Medical Association. All rights reserved. The codes documented in this report are preliminary and upon coder review may  be revised to meet current compliance requirements. Gerrit Friends. Kathleen Tamm, MD Gennette Pac, MD 03/26/2022 8:45:23 AM This report has been signed electronically. Number of Addenda: 0

## 2022-03-26 NOTE — H&P (Signed)
@LOGO @   Primary Care Physician:  , MD Primary Gastroenterologist:  Dr. Benita Stabile  Pre-Procedure History & Physical: HPI:  [name] is a 76 y.o. male here for with GERD esophageal dysphagia CAD atrial fibrillation anticoagulated presents for further evaluation.  Also history of multiple colonic adenomas removed 2015, positive family history colon cancer.  Overdue for surveillance colonoscopy.  He is here for an EGD and colonoscopy.  Chest pain recent eval by cardiology not felt to have any acute issues.  Past Medical History:  Diagnosis Date   Alcohol use    Anxiety    Chest pain    Coronary artery disease    a. Nonobstructive CAD by cath 2010 with negative nuclear stress test 05/2013.   Depression    Diabetes mellitus    Hyperlipidemia    Hypertension    Hypothyroidism    Kidney problem    a. 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   Mild aortic insufficiency    PAT (paroxysmal atrial tachycardia) (HCC)    Pericardial effusion    a. by echo 2014.   Pulmonary nodules    resolved   PVC's (premature ventricular contractions)    Stroke (cerebrum) (HCC)    Stroke due to embolism of left carotid artery (HCC) 03/24/2016   Left caudate head stroke   Wide-complex tachycardia    a. per [name] note: Event monitor September 2014 sinus rhythm, occasional PVCs, nonsustained PAT, and one 7 beat run of a wide QRS complex at 110-120 bpm, with minimal changes QRS axis, and QRS morphology not similar to documented PVCs.     Past Surgical History:  Procedure Laterality Date   BACK SURGERY  1996   BREAST LUMPECTOMY Left 1979   CIRCUMCISION  1980   COLONOSCOPY  2015   Dr. 2016, per patient, he had 7 polyps   MANDIBLE SURGERY Right 1969   PACEMAKER IMPLANT N/A 06/26/2019   Procedure: PACEMAKER IMPLANT;  Surgeon: 13/10/2018, MD;  Location: [name];  Service: Cardiovascular;  Laterality: N/A;    Prior to Admission medications   Medication Sig Start Date  End Date Taking? Authorizing Provider  acetaminophen (TYLENOL) 500 MG tablet Take 500 mg by mouth every 8 (eight) hours as needed for moderate pain or headache.   Yes [provider]  albuterol (VENTOLIN HFA) 108 (90 Base) MCG/ACT inhaler Inhale 1 puff into the lungs every 6 (six) hours as needed for wheezing.   Yes [provider]  ALPRAZolam [place]) 1 MG tablet Take 1 mg by mouth 3 (three) times daily. 01/18/16  Yes [provider]  apixaban (ELIQUIS) 5 MG TABS tablet TAKE 1 TABLET BY MOUTH TWICE DAILY 12/09/21  Yes Branch, 12/11/21, MD  atorvastatin (LIPITOR) 40 MG tablet Take 40 mg by mouth daily. 10/20/21  Yes [provider]  cyanocobalamin (VITAMIN B12) 1000 MCG tablet Take 1,000 mcg by mouth daily.   Yes [provider]  FARXIGA 10 MG TABS tablet Take 10 mg by mouth daily. 10/20/21  Yes [provider]  gabapentin (NEURONTIN) 600 MG tablet Take 600 mg by mouth with breakfast, with lunch, and with evening meal. 12/17/17  Yes [provider]  glimepiride (AMARYL) 2 MG tablet Take 2 mg by mouth daily. 01/29/21  Yes [provider]  isosorbide mononitrate (IMDUR) 30 MG 24 hr tablet TAKE 1 TABLET BY MOUTH EVERY DAY 01/08/22  Yes Branch, 01/10/22, MD  levothyroxine (SYNTHROID) 137 MCG tablet Take 137 mcg by  mouth daily. 10/20/21  Yes [provider]  lisinopril (ZESTRIL) 5 MG tablet Take 5 mg by mouth daily. 10/20/21  Yes [provider]  nitroGLYCERIN (NITROSTAT) 0.4 MG SL tablet Place 0.4 mg under the tongue every 5 (five) minutes as needed for chest pain.  03/08/16  Yes [provider]  omega-3 acid ethyl esters (LOVAZA) 1 g capsule Take 2 g by mouth 2 (two) times daily. 12/17/17  Yes [provider]  omeprazole (PRILOSEC) 40 MG capsule Take 40 mg by mouth daily before breakfast. 10/20/21  Yes [provider]  oxyCODONE-acetaminophen (PERCOCET) 10-325 MG tablet Take 1 tablet by mouth every  4 (four) hours as needed for pain. 02/08/18  Yes [provider]  polyethylene glycol-electrolytes (NULYTELY) 420 g solution As directed 03/08/22  Yes Zayla Agar, Gerrit Friends, MD  potassium chloride (MICRO-K) 10 MEQ CR capsule Take 10 mEq by mouth daily. 10/20/21  Yes [provider]  ACCU-CHEK GUIDE test strip USE TO TEST TWICE DAILY 08/24/21   [provider]  metFORMIN (GLUCOPHAGE) 500 MG tablet Take 1-2 tablets (500-1,000 mg total) by mouth See admin instructions. Take 1000 mg in the morning and 500 mg in the evening Patient not taking: Reported on 02/08/2022 12/07/19   Cleora Fleet, MD    Allergies as of 03/08/2022 - Review Complete 02/08/2022  Allergen Reaction Noted   Morphine Other (See Comments) 05/23/2013   Tape Other (See Comments) 08/11/2020   Sulfonamide derivatives Rash and Other (See Comments) 01/09/2009    Family History  Problem Relation Age of Onset   Lung cancer Mother    Diabetes Father    Bladder Cancer Father    Colon cancer Brother        Age 55, passed at age 82   Stroke Maternal Grandmother    Cancer Maternal Grandfather    Stroke Paternal Grandmother    Stroke Paternal Grandfather    Colon cancer Maternal Aunt        70s   Colon cancer Cousin     Social History   Socioeconomic History   Marital status: Legally Separated    Spouse name: [name]   Number of children: 3   Years of education: 10   Highest education level: Not on file  Occupational History   Occupation: Retired  Tobacco Use   Smoking status: Former    Types: Cigarettes    Quit date: 1984    Years since quitting: 39.6   Smokeless tobacco: Never  Vaping Use   Vaping Use: Never used  Substance and Sexual Activity   Alcohol use: Not Currently   Drug use: Not Currently    Types: Marijuana   Sexual activity: Not on file  Other Topics Concern   Not on file  Social History Narrative   Lives at home w/ his wife   Right-handed   Caffeine: 2-3 cups of coffee each  morning   Social Determinants of Health   Financial Resource Strain: Not on file  Food Insecurity: No Food Insecurity (12/11/2019)   Hunger Vital Sign    Worried About Running Out of Food in the Last Year: Never true    Ran Out of Food in the Last Year: Never true  Transportation Needs: No Transportation Needs (12/11/2019)   PRAPARE - Administrator, Civil Service (Medical): No    Lack of Transportation (Non-Medical): No  Physical Activity: Not on file  Stress: Not on file  Social Connections: Not on file  Intimate  Partner Violence: Not on file    Review of Systems: See HPI, otherwise negative ROS  Physical Exam: BP (!) 140/65   Pulse 75   Temp 97.7 F (36.5 C) (Oral)   Resp 17   Ht 5\' 8"  (1.727 m)   Wt 84.8 kg   SpO2 98%   BMI 28.43 kg/m  General:   Alert,  Well-developed, well-nourished, pleasant and cooperative in NAD Neck:  Supple; no masses or thyromegaly. No significant cervical adenopathy. Lungs:  Clear throughout to auscultation.   No wheezes, crackles, or rhonchi. No acute distress. Heart:  Regular rate and rhythm; no murmurs, clicks, rubs,  or gallops. Abdomen: Non-distended, normal bowel sounds.  Soft and nontender without appreciable mass or hepatosplenomegaly.  Pulses:  Normal pulses noted. Extremities:  Without clubbing or edema.  Impression/Plan: 76 year old gentleman esophageal dysphagia.  History of colonic polyps and positive family history of colon cancer.  Patient is here for an EGD with possible esophageal dilation and a surveillance colonoscopy. The risks, benefits, limitations, imponderables and alternatives regarding both EGD and colonoscopy have been reviewed with the patient. Questions have been answered. All parties agreeable.    Eliquis held 2 days ago per patient.   Notice: This dictation was prepared with Dragon dictation along with smaller phrase technology. Any transcriptional errors that result from this process are  unintentional and may not be corrected upon review.

## 2022-03-26 NOTE — Anesthesia Preprocedure Evaluation (Signed)
Anesthesia Evaluation  Patient identified by MRN, date of birth, ID band Patient awake    Reviewed: Allergy & Precautions, H&P , NPO status , Patient's Chart, lab work & pertinent test results, reviewed documented beta blocker date and time   Airway Mallampati: II  TM Distance: >3 FB Neck ROM: full    Dental no notable dental hx.    Pulmonary neg pulmonary ROS, former smoker,    Pulmonary exam normal breath sounds clear to auscultation       Cardiovascular Exercise Tolerance: Good hypertension, + dysrhythmias Supra Ventricular Tachycardia  Rhythm:regular Rate:Normal     Neuro/Psych PSYCHIATRIC DISORDERS Anxiety Depression CVA    GI/Hepatic negative GI ROS, Neg liver ROS,   Endo/Other  diabetes, Type 2Hypothyroidism   Renal/GU CRFRenal disease  negative genitourinary   Musculoskeletal   Abdominal   Peds  Hematology  (+) Blood dyscrasia, anemia ,   Anesthesia Other Findings   Reproductive/Obstetrics negative OB ROS                             Anesthesia Physical Anesthesia Plan  ASA: 3  Anesthesia Plan: General   Post-op Pain Management:    Induction:   PONV Risk Score and Plan: Propofol infusion  Airway Management Planned:   Additional Equipment:   Intra-op Plan:   Post-operative Plan:   Informed Consent: I have reviewed the patients History and Physical, chart, labs and discussed the procedure including the risks, benefits and alternatives for the proposed anesthesia with the patient or authorized representative who has indicated his/her understanding and acceptance.     Dental Advisory Given  Plan Discussed with: CRNA  Anesthesia Plan Comments:         Anesthesia Quick Evaluation

## 2022-03-26 NOTE — Anesthesia Postprocedure Evaluation (Signed)
Anesthesia Post Note  Patient: [name]  Procedure(s) Performed: COLONOSCOPY WITH PROPOFOL ESOPHAGOGASTRODUODENOSCOPY (EGD) WITH PROPOFOL MALONEY DILATION  Patient location during evaluation: Phase II Anesthesia Type: General Level of consciousness: awake Pain management: pain level controlled Vital Signs Assessment: post-procedure vital signs reviewed and stable Respiratory status: spontaneous breathing and respiratory function stable Cardiovascular status: blood pressure returned to baseline and stable Postop Assessment: no headache and no apparent nausea or vomiting Anesthetic complications: no Comments: Late entry   No notable events documented.   Last Vitals:  Vitals:   03/26/22 0708 03/26/22 0842  BP: (!) 140/65 124/88  Pulse:  87  Resp:  14  Temp:  36.4 C  SpO2:  95%    Last Pain:  Vitals:   03/26/22 0842  TempSrc: Axillary  PainSc: Asleep                 Windell Norfolk

## 2022-03-26 NOTE — Transfer of Care (Signed)
Immediate Anesthesia Transfer of Care Note  Patient: [name]  Procedure(s) Performed: COLONOSCOPY WITH PROPOFOL ESOPHAGOGASTRODUODENOSCOPY (EGD) WITH PROPOFOL MALONEY DILATION  Patient Location: Short Stay  Anesthesia Type:General  Level of Consciousness: sedated  Airway & Oxygen Therapy: Patient Spontanous Breathing  Post-op Assessment: Report given to RN and Post -op Vital signs reviewed and stable  Post vital signs: Reviewed and stable  Last Vitals:  Vitals Value Taken Time  BP 124/88 03/26/22 0842  Temp 36.4 C 03/26/22 0842  Pulse 87 03/26/22 0842  Resp 14 03/26/22 0842  SpO2 95 % 03/26/22 0842    Last Pain:  Vitals:   03/26/22 0842  TempSrc: Axillary  PainSc: Asleep      Patients Stated Pain Goal: 5 (03/26/22 5465)  Complications: No notable events documented.

## 2022-03-26 NOTE — Op Note (Signed)
[facility] Patient Name: [name] Procedure Date: 03/26/2022 7:56 AM MRN: [id number] Date of Birth: [birth year] Attending MD: Gennette Pac , MD CSN: [id number] Age: 76 Admit Type: Outpatient Procedure:                Upper GI endoscopy Indications:              Dysphagia Providers:                Gennette Pac, MD, Edrick Kins, RN, Carlean Purl RN, RN, Lennice Sites, Technician, Burke Keels, Technician Referring MD:              Medicines:                Propofol per Anesthesia Complications:            No immediate complications. Estimated Blood Loss:     Estimated blood loss: none. Procedure:                Pre-Anesthesia Assessment:                           - Prior to the procedure, a History and Physical                            was performed, and patient medications and                            allergies were reviewed. The patient's tolerance of                            previous anesthesia was also reviewed. The risks                            and benefits of the procedure and the sedation                            options and risks were discussed with the patient.                            All questions were answered, and informed consent                            was obtained. Prior Anticoagulants: The patient                            last took Eliquis (apixaban) 2 days prior to the                            procedure. ASA Grade Assessment: III - A patient  with severe systemic disease. After reviewing the                            risks and benefits, the patient was deemed in                            satisfactory condition to undergo the procedure.                           After obtaining informed consent, the endoscope was                            passed under direct vision. Throughout the                            procedure, the patient's  blood pressure, pulse, and                            oxygen saturations were monitored continuously. The                            [id number]-[id number]) scope was introduced through the                            mouth, and advanced to the second part of duodenum.                            The upper GI endoscopy was accomplished without                            difficulty. The patient tolerated the procedure                            well. Scope In: 8:13:30 AM Scope Out: 8:19:15 AM Total Procedure Duration: 0 hours 5 minutes 45 seconds  Findings:      The examined esophagus was normal. The scope was withdrawn. Dilation was       performed with a Maloney dilator with mild resistance at 56 Fr. The       dilation site was examined following endoscope reinsertion and showed no       change. Estimated blood loss: none.      A small hiatal hernia was present. Otherwise, normal appearing gastric       mucosa.      The duodenal bulb and second portion of the duodenum were normal. Impression:               - Normal esophagus. Dilated.                           - Small hiatal hernia.                           - Normal stomach.                           - Normal duodenal bulb  and second portion of the                            duodenum.                           - No specimens collected. Moderate Sedation:      Moderate (conscious) sedation was personally administered by an       anesthesia professional. The following parameters were monitored: oxygen       saturation, heart rate, blood pressure, respiratory rate, EKG, adequacy       of pulmonary ventilation, and response to care. Recommendation:           - Patient has a contact number available for                            emergencies. The signs and symptoms of potential                            delayed complications were discussed with the                            patient. Return to normal activities tomorrow.                             Written discharge instructions were provided to the                            patient.                           - Advance diet as tolerated.                           - Continue present medications.                           - Return to my office (date not yet determined).                            See colonoscopy report. Procedure Code(s):        --- Professional ---                           [id number], Esophagogastroduodenoscopy, flexible,                            transoral; diagnostic, including collection of                            specimen(s) by brushing or washing, when performed                            (separate procedure)                           43450, Dilation of esophagus, by unguided sound or  bougie, single or multiple passes Diagnosis Code(s):        --- Professional ---                           K44.9, Diaphragmatic hernia without obstruction or                            gangrene                           R13.10, Dysphagia, unspecified CPT copyright 2019 American Medical Association. All rights reserved. The codes documented in this report are preliminary and upon coder review may  be revised to meet current compliance requirements. Gerrit Friends. Kearia Yin, MD Gennette Pac, MD 03/26/2022 8:42:03 AM This report has been signed electronically. Number of Addenda: 0

## 2022-03-26 NOTE — Discharge Instructions (Addendum)
Colonoscopy Discharge Instructions  Read the instructions outlined below and refer to this sheet in the next few weeks. These discharge instructions provide you with general information on caring for yourself after you leave the hospital. Your doctor may also give you specific instructions. While your treatment has been planned according to the most current medical practices available, unavoidable complications occasionally occur. If you have any problems or questions after discharge, call Dr. Gala Romney at [phone]. ACTIVITY You may resume your regular activity, but move at a slower pace for the next 24 hours.  Take frequent rest periods for the next 24 hours.  Walking will help get rid of the air and reduce the bloated feeling in your belly (abdomen).  No driving for 24 hours (because of the medicine (anesthesia) used during the test).   Do not sign any important legal documents or operate any machinery for 24 hours (because of the anesthesia used during the test).  NUTRITION Drink plenty of fluids.  You may resume your normal diet as instructed by your doctor.  Begin with a light meal and progress to your normal diet. Heavy or fried foods are harder to digest and may make you feel sick to your stomach (nauseated).  Avoid alcoholic beverages for 24 hours or as instructed.  MEDICATIONS You may resume your normal medications unless your doctor tells you otherwise.  WHAT YOU CAN EXPECT TODAY Some feelings of bloating in the abdomen.  Passage of more gas than usual.  Spotting of blood in your stool or on the toilet paper.  IF YOU HAD POLYPS REMOVED DURING THE COLONOSCOPY: No aspirin products for 7 days or as instructed.  No alcohol for 7 days or as instructed.  Eat a soft diet for the next 24 hours.  FINDING OUT THE RESULTS OF YOUR TEST Not all test results are available during your visit. If your test results are not back during the visit, make an appointment with your caregiver to find out the  results. Do not assume everything is normal if you have not heard from your caregiver or the medical facility. It is important for you to follow up on all of your test results.  SEEK IMMEDIATE MEDICAL ATTENTION IF: You have more than a spotting of blood in your stool.  Your belly is swollen (abdominal distention).  You are nauseated or vomiting.  You have a temperature over 101.  You have abdominal pain or discomfort that is severe or gets worse throughout the day.   EGD Discharge instructions Please read the instructions outlined below and refer to this sheet in the next few weeks. These discharge instructions provide you with general information on caring for yourself after you leave the hospital. Your doctor may also give you specific instructions. While your treatment has been planned according to the most current medical practices available, unavoidable complications occasionally occur. If you have any problems or questions after discharge, please call your doctor. ACTIVITY You may resume your regular activity but move at a slower pace for the next 24 hours.  Take frequent rest periods for the next 24 hours.  Walking will help expel (get rid of) the air and reduce the bloated feeling in your abdomen.  No driving for 24 hours (because of the anesthesia (medicine) used during the test).  You may shower.  Do not sign any important legal documents or operate any machinery for 24 hours (because of the anesthesia used during the test).  NUTRITION Drink plenty of fluids.  You may  resume your normal diet.  Begin with a light meal and progress to your normal diet.  Avoid alcoholic beverages for 24 hours or as instructed by your caregiver.  MEDICATIONS You may resume your normal medications unless your caregiver tells you otherwise.  WHAT YOU CAN EXPECT TODAY You may experience abdominal discomfort such as a feeling of fullness or "gas" pains.  FOLLOW-UP Your doctor will discuss the results of  your test with you.  SEEK IMMEDIATE MEDICAL ATTENTION IF ANY OF THE FOLLOWING OCCUR: Excessive nausea (feeling sick to your stomach) and/or vomiting.  Severe abdominal pain and distention (swelling).  Trouble swallowing.  Temperature over 101 F (37.8 C).  Rectal bleeding or vomiting of blood.     Your colon appeared normal today.  It is recommended you have 1 more colonoscopy in 5 years -depending on your overall health at that time  Your esophagus was stretched today  Continue present medications; resume Eliquis today.  Office visit with [name] in 4 weeks  At patient request, I called [name] at [phone] -reviewed findings and recommendations. 7

## 2022-04-01 ENCOUNTER — Encounter (HOSPITAL_COMMUNITY): Payer: Self-pay | Admitting: Internal Medicine

## 2022-04-06 DIAGNOSIS — E1142 Type 2 diabetes mellitus with diabetic polyneuropathy: Secondary | ICD-10-CM | POA: Diagnosis not present

## 2022-04-06 DIAGNOSIS — D696 Thrombocytopenia, unspecified: Secondary | ICD-10-CM | POA: Diagnosis not present

## 2022-04-06 DIAGNOSIS — I129 Hypertensive chronic kidney disease with stage 1 through stage 4 chronic kidney disease, or unspecified chronic kidney disease: Secondary | ICD-10-CM | POA: Diagnosis not present

## 2022-04-06 DIAGNOSIS — Z0000 Encounter for general adult medical examination without abnormal findings: Secondary | ICD-10-CM | POA: Diagnosis not present

## 2022-04-06 DIAGNOSIS — M545 Low back pain, unspecified: Secondary | ICD-10-CM | POA: Diagnosis not present

## 2022-04-06 DIAGNOSIS — R600 Localized edema: Secondary | ICD-10-CM | POA: Diagnosis not present

## 2022-04-06 DIAGNOSIS — G894 Chronic pain syndrome: Secondary | ICD-10-CM | POA: Diagnosis not present

## 2022-04-06 DIAGNOSIS — E782 Mixed hyperlipidemia: Secondary | ICD-10-CM | POA: Diagnosis not present

## 2022-04-06 DIAGNOSIS — E039 Hypothyroidism, unspecified: Secondary | ICD-10-CM | POA: Diagnosis not present

## 2022-04-06 DIAGNOSIS — E1165 Type 2 diabetes mellitus with hyperglycemia: Secondary | ICD-10-CM | POA: Diagnosis not present

## 2022-04-06 DIAGNOSIS — K219 Gastro-esophageal reflux disease without esophagitis: Secondary | ICD-10-CM | POA: Diagnosis not present

## 2022-04-14 DIAGNOSIS — I129 Hypertensive chronic kidney disease with stage 1 through stage 4 chronic kidney disease, or unspecified chronic kidney disease: Secondary | ICD-10-CM | POA: Diagnosis not present

## 2022-04-14 DIAGNOSIS — I4891 Unspecified atrial fibrillation: Secondary | ICD-10-CM | POA: Diagnosis not present

## 2022-04-14 DIAGNOSIS — N1831 Chronic kidney disease, stage 3a: Secondary | ICD-10-CM | POA: Diagnosis not present

## 2022-04-14 DIAGNOSIS — E1122 Type 2 diabetes mellitus with diabetic chronic kidney disease: Secondary | ICD-10-CM | POA: Diagnosis not present

## 2022-04-14 DIAGNOSIS — N1832 Chronic kidney disease, stage 3b: Secondary | ICD-10-CM | POA: Diagnosis not present

## 2022-04-14 DIAGNOSIS — R55 Syncope and collapse: Secondary | ICD-10-CM | POA: Diagnosis not present

## 2022-04-14 DIAGNOSIS — E1129 Type 2 diabetes mellitus with other diabetic kidney complication: Secondary | ICD-10-CM | POA: Diagnosis not present

## 2022-04-15 ENCOUNTER — Encounter: Payer: Self-pay | Admitting: Gastroenterology

## 2022-04-15 NOTE — Progress Notes (Signed)
Referring Provider: Benita Stabile, MD Primary Care Physician:  Benita Stabile, MD Primary GI Physician: Dr. Jena Gauss  Chief Complaint  Patient presents with   Follow-up    Has had diarrhea for 3 days     HPI:   [name] is a 76 y.o. male with history of bradycardia s/p pancemaker, a fib on Eliquis, HTN, HLD, stroke, CKD, diabetes, polyneuropathy, hypothyroidism, anxiety/depression, GERD, constipation, presenting today for follow-up of abdominal pain s/p CT, labs, EGD, and colonoscopy.   Last seen in our office 02/08/2022.  He reported 6 months of intermittent lower abdominal pain occurring a couple days a month and lasting a couple days at a time with waxing and waning symptoms described as cramping/sharp.  No association with bowel movements or eating.  Chronic constipation with bowel movements about once a week.  No overt GI bleeding.  GERD controlled on omeprazole 40 mg daily.  Intermittent solid food dysphagia x6 months.  Intermittent RUQ abdominal pain primarily when driving having to stop the car and get out and stretch.  No association with meals.  Noted patient had chronic normocytic anemia with hemoglobin in the 11-12 range and also chronic mild thrombocytopenia.  On exam, he had mild TTP in RUQ, moderate TTP in RLQ with voluntary guarding, and mild TTP in suprapubic region.  Plan included CT A/P with contrast, colonoscopy/EGD, update labs, MiraLAX daily.   CT A/P with no acute findings.  Nonobstructing right kidney stones.  Fatty infiltration of the liver, normal spleen.  CBC with stable hemoglobin of 12.9 with macrocytic indices, platelets normal, iron panel within normal limits, B12 low at 175, folate within normal limits, LFTs normal.  Recommended starting B12 1000 mcg daily and follow-up with PCP on B12 deficiency.  Procedures 03/26/2022: Colonoscopy: Normal exam. Recommended 5-year repeat due to personal history of colon polyps. EGD: Normal esophagus dilated, small hiatal  hernia, normal stomach and examined duodenum.   Today:   GERD:  Well controlled on omeprazole 40 mg daily. No nausea or vomiting.   Dysphagia:  Resolved.   Started having diarrhea about 3 days ago.  Stools are watery with about 3-4 bowel movements per day.  No specific dietary triggers.  No BRBPR, melena, nocturnal stools, or abdominal pain.  Reports he started a new medication a week or so ago.  The only new medication on file was Cymbalta.  Appears this was started on 8/15.  Patient states he is not sure why he is taking the medication.  Initially thought it was something to help with constipation. No recent antibiotics, hospitalizations, or travel.   Prior to diarrhea, he was skip several days between bowel movements.   Has lost about 7 lbs since June. Doesn't have a good appetite. Gets full quickly. Doesn't get hungry. Eats because he has to.  Lack of appetite started about 1 month ago.  Blood pressure is little soft today.  He denies lightheadedness, dizziness, presyncope.reports he already taken his blood pressure medications today.   Past Medical History:  Diagnosis Date   Alcohol use    Anxiety    Chest pain    Coronary artery disease    a. Nonobstructive CAD by cath 2010 with negative nuclear stress test 05/2013.   Depression    Diabetes mellitus    Hyperlipidemia    Hypertension    Hypothyroidism    Kidney problem    a. 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   Mild aortic insufficiency    PAT (  paroxysmal atrial tachycardia) (HCC)    Pericardial effusion    a. by echo 2014.   Pulmonary nodules    resolved   PVC's (premature ventricular contractions)    Stroke (cerebrum) (HCC)    Stroke due to embolism of left carotid artery (HCC) 03/24/2016   Left caudate head stroke   Wide-complex tachycardia    a. per [name] note: Event monitor September 2014 sinus rhythm, occasional PVCs, nonsustained PAT, and one 7 beat run of a wide QRS complex at 110-120 bpm, with minimal  changes QRS axis, and QRS morphology not similar to documented PVCs.     Past Surgical History:  Procedure Laterality Date   BACK SURGERY  1996   BREAST LUMPECTOMY Left 1979   CIRCUMCISION  1980   COLONOSCOPY  10/18/2014   Dr. Teena Dunk, tubular adenoma in the ascending and distal transverse colon, hyperplastic polyp in sigmoid colon.  Recommended 3-5-year repeat.   COLONOSCOPY WITH PROPOFOL N/A 03/26/2022   Surgeon: Corbin Ade, MD; normal exam.  Repeat in 5 years due to personal history of polyps.   ESOPHAGOGASTRODUODENOSCOPY (EGD) WITH PROPOFOL N/A 03/26/2022   Surgeon: Corbin Ade, MD; Normal esophagus dilated, small hiatal hernia, normal stomach and examined duodenum.   MALONEY DILATION N/A 03/26/2022   Procedure: Elease Hashimoto DILATION;  Surgeon: Corbin Ade, MD;  Location: [facility];  Service: Endoscopy;  Laterality: N/A;   MANDIBLE SURGERY Right 1969   PACEMAKER IMPLANT N/A 06/26/2019   Procedure: PACEMAKER IMPLANT;  Surgeon: Hillis Range, MD;  Location: [name];  Service: Cardiovascular;  Laterality: N/A;    Current Outpatient Medications  Medication Sig Dispense Refill   ACCU-CHEK GUIDE test strip USE TO TEST TWICE DAILY     acetaminophen (TYLENOL) 500 MG tablet Take 500 mg by mouth every 8 (eight) hours as needed for moderate pain or headache.     ALPRAZolam (XANAX) 1 MG tablet Take 1 mg by mouth 3 (three) times daily.  2   apixaban (ELIQUIS) 5 MG TABS tablet TAKE 1 TABLET BY MOUTH TWICE DAILY 60 tablet 11   atorvastatin (LIPITOR) 40 MG tablet Take 40 mg by mouth daily.     cyanocobalamin (VITAMIN B12) 1000 MCG tablet Take 1,000 mcg by mouth daily.     DULoxetine (CYMBALTA) 30 MG capsule Take 30 mg by mouth daily.     FARXIGA 10 MG TABS tablet Take 10 mg by mouth daily.     gabapentin (NEURONTIN) 600 MG tablet Take 600 mg by mouth with breakfast, with lunch, and with evening meal. 300 in the morning, 600 in afternoon and evening.  1   glimepiride (AMARYL)  2 MG tablet Take 2 mg by mouth daily.     isosorbide mononitrate (IMDUR) 30 MG 24 hr tablet TAKE 1 TABLET BY MOUTH EVERY DAY 90 tablet 1   levothyroxine (SYNTHROID) 137 MCG tablet Take 137 mcg by mouth daily.     lisinopril (ZESTRIL) 5 MG tablet Take 5 mg by mouth daily.     nitroGLYCERIN (NITROSTAT) 0.4 MG SL tablet Place 0.4 mg under the tongue every 5 (five) minutes as needed for chest pain.      omega-3 acid ethyl esters (LOVAZA) 1 g capsule Take 2 g by mouth 2 (two) times daily.  12   omeprazole (PRILOSEC) 40 MG capsule Take 40 mg by mouth daily before breakfast.     oxyCODONE-acetaminophen (PERCOCET) 10-325 MG tablet Take 1 tablet by mouth every 4 (four) hours as needed for pain.  0   potassium chloride (MICRO-K) 10 MEQ CR capsule Take 10 mEq by mouth daily.     albuterol (VENTOLIN HFA) 108 (90 Base) MCG/ACT inhaler Inhale 1 puff into the lungs every 6 (six) hours as needed for wheezing. (Patient not taking: Reported on 04/19/2022)     No current facility-administered medications for this visit.    Allergies as of 04/19/2022 - Review Complete 04/19/2022  Allergen Reaction Noted   Morphine Other (See Comments) 05/23/2013   Tape Other (See Comments) 08/11/2020   Sulfonamide derivatives Rash and Other (See Comments) 01/09/2009    Family History  Problem Relation Age of Onset   Lung cancer Mother    Diabetes Father    Bladder Cancer Father    Colon cancer Brother        Age 80, passed at age 60   Stroke Maternal Grandmother    Cancer Maternal Grandfather    Stroke Paternal Grandmother    Stroke Paternal Grandfather    Colon cancer Maternal Aunt        70s   Colon cancer Cousin     Social History   Socioeconomic History   Marital status: Legally Separated    Spouse name: [name]   Number of children: 3   Years of education: 10   Highest education level: Not on file  Occupational History   Occupation: Retired  Tobacco Use   Smoking status: Former    Types: Cigarettes     Quit date: 1984    Years since quitting: 39.6   Smokeless tobacco: Never  Vaping Use   Vaping Use: Never used  Substance and Sexual Activity   Alcohol use: Not Currently   Drug use: Not Currently    Types: Marijuana   Sexual activity: Not on file  Other Topics Concern   Not on file  Social History Narrative   Lives at home w/ his wife   Right-handed   Caffeine: 2-3 cups of coffee each morning   Social Determinants of Health   Financial Resource Strain: Not on file  Food Insecurity: No Food Insecurity (12/11/2019)   Hunger Vital Sign    Worried About Running Out of Food in the Last Year: Never true    Ran Out of Food in the Last Year: Never true  Transportation Needs: No Transportation Needs (12/11/2019)   PRAPARE - Administrator, Civil Service (Medical): No    Lack of Transportation (Non-Medical): No  Physical Activity: Not on file  Stress: Not on file  Social Connections: Not on file    Review of Systems: Gen: Denies fever, chills, cold or flulike symptoms, presyncope, syncope. CV: Denies chest pain, palpitations. Resp: Denies dyspnea at rest, cough. GI: See HPI Heme: See HPI  Physical Exam: BP (!) 95/51 (BP Location: Left Arm, Patient Position: Sitting, Cuff Size: Normal)   Pulse 64   Temp 97.6 F (36.4 C) (Temporal)   Ht 5\' 8"  (1.727 m)   Wt 175 lb 9.6 oz (79.7 kg)   SpO2 96%   BMI 26.70 kg/m  General: Alert and oriented. No distress noted. Pleasant and cooperative.  Head:  Normocephalic and atraumatic. Eyes:  Conjuctiva clear without scleral icterus. Heart:  S1, S2 present without murmurs appreciated. Lungs:  Clear to auscultation bilaterally. No wheezes, rales, or rhonchi. No distress.  Abdomen:  +BS, soft, non-tender and non-distended. No rebound or guarding. No HSM or masses noted. Msk:  Symmetrical without gross deformities. Normal posture. Extremities:  Without edema. Neurologic:  Alert and  oriented x4.  Psych:  Normal mood and  affect.    Assessment:  76 y.o. male with history of bradycardia s/p pancemaker, a fib on Eliquis, HTN, HLD, stroke, CKD, diabetes, polyneuropathy, hypothyroidism, anxiety/depression, GERD, constipation, presenting today for follow-up of dysphagia, abdominal pain, and now with chief complaint of change in bowel habits, now with diarrhea.  Dysphagia: Resolved.  EGD completed 03/26/2022 revealed normal esophagus s/p empiric dilation.  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  GERD: Chronic.  Well-controlled on omeprazole 40 mg daily.  Diarrhea/change in bowel habits: Previously with constipation with bowels moving about once a week, now with 3-4 loose/watery stools for the last 3 days.  Denies recent antibiotics, sick contacts, travel, hospitalization.  He was started on Cymbalta 1.5-2 weeks ago and diarrhea as a potential side effect.  Denies other new medications. Metformin and Wellbutrin have been discontinued.  No associated abdominal pain.  Prior abdominal pain has completely resolved.  Denies BRBPR, melena, nocturnal stools.  He has had some weight loss, down 7 pounds compared to last OV with me in June which could be influenced by diarrhea but also has general lack of appetite discussed below.  Recent colonoscopy 03/26/2022 with entirely normal exam.  Recommended 5-year surveillance due to personal history of colon polyps.   At this point, etiology is unclear.  Will check stool studies, thyroid function, screen for celiac disease.  Also advised patient that he may need to discuss Cymbalta with PCP especially if work-up is unrevealing.  Lack of appetite/weight loss: 1 month history of lack of appetite and early satiety.  Reports he only eats because he has to.  Documented 7 pound weight loss compared to last office visit with me in June.  Recent EGD 03/26/2022 with normal exam aside from small hiatal hernia.  Chronic GERD well controlled.  Denies nausea or vomiting.  He does have history of diabetes  and could have component of gastroparesis.  Discussed pursuing gastric emptying study, but patient has requested to hold off on this for now. Also recently started on Cymbalta which could contribute as well.  CT scan in June 2023 with no significant abnormalities.   Soft Blood Pressure:  BP 95/51 today.  Asymptomatic.  Has already taken blood pressure medications.  He has a blood pressure cuff at home to monitor.  Recommended checking his blood pressure tonight and if any worsening, proceed to emergency room.  Also recommended calling his PCP for further recommendations prior to taking blood pressure medications in the morning.    Plan:  CBC, BMP, C. difficile GDH and toxin A/B, GI panel, Giardia, Cryptosporidium, TSH, IgA, TTG IgA Recommended patient discuss Cymbalta with PCP as this may be contributing to diarrhea and lack of appetite. Try eating 4 small meals daily. Add 1-2 protein shakes daily to maintain adequate nutritional intake and weight. Hold off on gastric emptying study per patient's request. Continue omeprazole 40 mg daily.   Follow-up date TBD pending labs and stool studies.   Ermalinda Memos, PA-C [facility] 04/19/2022

## 2022-04-19 ENCOUNTER — Encounter: Payer: Self-pay | Admitting: Gastroenterology

## 2022-04-19 ENCOUNTER — Ambulatory Visit (INDEPENDENT_AMBULATORY_CARE_PROVIDER_SITE_OTHER): Payer: Medicare Other | Admitting: Gastroenterology

## 2022-04-19 VITALS — BP 95/51 | HR 64 | Temp 97.6°F | Ht 68.0 in | Wt 175.6 lb

## 2022-04-19 DIAGNOSIS — K219 Gastro-esophageal reflux disease without esophagitis: Secondary | ICD-10-CM | POA: Insufficient documentation

## 2022-04-19 DIAGNOSIS — R1310 Dysphagia, unspecified: Secondary | ICD-10-CM

## 2022-04-19 DIAGNOSIS — R634 Abnormal weight loss: Secondary | ICD-10-CM

## 2022-04-19 DIAGNOSIS — R197 Diarrhea, unspecified: Secondary | ICD-10-CM

## 2022-04-19 DIAGNOSIS — R630 Anorexia: Secondary | ICD-10-CM

## 2022-04-19 NOTE — Patient Instructions (Signed)
Please have blood work and stool studies completed at [facility].  Try eating 4 small meals daily.  I recommend adding 1-2 protein shakes daily to maintain adequate nutritional intake and maintain your weight.  As we discussed, some of your diarrhea and change in appetite/lack of appetite could be related to Cymbalta.  I recommend discussing this with your primary care doctor if your blood work and stool studies are negative.  As we discussed, your blood pressure is low today at 95/51.  Please monitor your blood pressure at home tonight and tomorrow morning.  If your blood pressure gets any lower, you should proceed to the emergency room.  If you have lightheaded, dizzy, like you will pass out, you should proceed to the emergency room.  I also recommend that you call your primary care doctor to let them know about this and see what they recommend in regards to your blood pressure medications before taking them tomorrow morning.    Ermalinda Memos, PA-C [facility]

## 2022-04-21 DIAGNOSIS — R197 Diarrhea, unspecified: Secondary | ICD-10-CM | POA: Diagnosis not present

## 2022-04-22 LAB — BASIC METABOLIC PANEL
BUN/Creatinine Ratio: 9 (calc) (ref 6–22)
BUN: 14 mg/dL (ref 7–25)
CO2: 24 mmol/L (ref 20–32)
Calcium: 9.4 mg/dL (ref 8.6–10.3)
Chloride: 106 mmol/L (ref 98–110)
Creat: 1.61 mg/dL — ABNORMAL HIGH (ref 0.70–1.28)
Glucose, Bld: 87 mg/dL (ref 65–99)
Potassium: 4 mmol/L (ref 3.5–5.3)
Sodium: 142 mmol/L (ref 135–146)

## 2022-04-22 LAB — CBC WITH DIFFERENTIAL/PLATELET
Absolute Monocytes: 574 cells/uL (ref 200–950)
Basophils Absolute: 28 cells/uL (ref 0–200)
Basophils Relative: 0.4 %
Eosinophils Absolute: 196 cells/uL (ref 15–500)
Eosinophils Relative: 2.8 %
HCT: 42.9 % (ref 38.5–50.0)
Hemoglobin: 14.3 g/dL (ref 13.2–17.1)
Lymphs Abs: 1813 cells/uL (ref 850–3900)
MCH: 31.3 pg (ref 27.0–33.0)
MCHC: 33.3 g/dL (ref 32.0–36.0)
MCV: 93.9 fL (ref 80.0–100.0)
MPV: 12.1 fL (ref 7.5–12.5)
Monocytes Relative: 8.2 %
Neutro Abs: 4389 cells/uL (ref 1500–7800)
Neutrophils Relative %: 62.7 %
Platelets: 190 10*3/uL (ref 140–400)
RBC: 4.57 10*6/uL (ref 4.20–5.80)
RDW: 12.4 % (ref 11.0–15.0)
Total Lymphocyte: 25.9 %
WBC: 7 10*3/uL (ref 3.8–10.8)

## 2022-04-22 LAB — TISSUE TRANSGLUTAMINASE, IGA: (tTG) Ab, IgA: <1 U/mL

## 2022-04-22 LAB — TSH: TSH: 2.79 mIU/L (ref 0.40–4.50)

## 2022-04-22 LAB — IGA: Immunoglobulin A: 377 mg/dL — ABNORMAL HIGH (ref 70–320)

## 2022-04-27 DIAGNOSIS — R197 Diarrhea, unspecified: Secondary | ICD-10-CM | POA: Diagnosis not present

## 2022-04-30 LAB — GASTROINTESTINAL PATHOGEN PNL
CampyloBacter Group: NOT DETECTED
Norovirus GI/GII: NOT DETECTED
Rotavirus A: NOT DETECTED
Salmonella species: NOT DETECTED
Shiga Toxin 1: NOT DETECTED
Shiga Toxin 2: NOT DETECTED
Shigella Species: NOT DETECTED
Vibrio Group: NOT DETECTED
Yersinia enterocolitica: NOT DETECTED

## 2022-04-30 LAB — GIARDIA ANTIGEN
MICRO NUMBER:: 13873850
RESULT:: NOT DETECTED
SPECIMEN QUALITY:: ADEQUATE

## 2022-04-30 LAB — CRYPTOSPORIDIUM ANTIGEN, EIA
Specimen Quality:: ADEQUATE
micro Number:: 13873878

## 2022-04-30 LAB — C. DIFFICILE GDH AND TOXIN A/B
GDH ANTIGEN: NOT DETECTED
MICRO NUMBER:: 13874258
SPECIMEN QUALITY:: ADEQUATE
TOXIN A AND B: NOT DETECTED

## 2022-05-14 DIAGNOSIS — H35033 Hypertensive retinopathy, bilateral: Secondary | ICD-10-CM | POA: Diagnosis not present

## 2022-05-14 DIAGNOSIS — H5213 Myopia, bilateral: Secondary | ICD-10-CM | POA: Diagnosis not present

## 2022-05-20 ENCOUNTER — Ambulatory Visit (INDEPENDENT_AMBULATORY_CARE_PROVIDER_SITE_OTHER): Payer: Medicare Other

## 2022-05-20 DIAGNOSIS — I495 Sick sinus syndrome: Secondary | ICD-10-CM

## 2022-05-21 LAB — CUP PACEART REMOTE DEVICE CHECK
Battery Remaining Longevity: 99 mo
Battery Remaining Percentage: 81 %
Battery Voltage: 3.01 V
Brady Statistic AP VP Percent: 1 %
Brady Statistic AP VS Percent: 20 %
Brady Statistic AS VP Percent: 1 %
Brady Statistic AS VS Percent: 80 %
Brady Statistic RA Percent Paced: 20 %
Brady Statistic RV Percent Paced: 1 %
Date Time Interrogation Session: 20230928020012
Implantable Lead Implant Date: 20201103
Implantable Lead Implant Date: 20201103
Implantable Lead Location: 753859
Implantable Lead Location: 753860
Implantable Pulse Generator Implant Date: 20201103
Lead Channel Impedance Value: 400 Ohm
Lead Channel Impedance Value: 480 Ohm
Lead Channel Pacing Threshold Amplitude: 0.5 V
Lead Channel Pacing Threshold Amplitude: 0.75 V
Lead Channel Pacing Threshold Pulse Width: 0.5 ms
Lead Channel Pacing Threshold Pulse Width: 0.5 ms
Lead Channel Sensing Intrinsic Amplitude: 0.9 mV
Lead Channel Sensing Intrinsic Amplitude: 9.5 mV
Lead Channel Setting Pacing Amplitude: 2 V
Lead Channel Setting Pacing Amplitude: 2.5 V
Lead Channel Setting Pacing Pulse Width: 0.5 ms
Lead Channel Setting Sensing Sensitivity: 2 mV
Pulse Gen Model: 2272
Pulse Gen Serial Number: 9174806

## 2022-05-26 NOTE — Progress Notes (Signed)
Remote pacemaker transmission.   

## 2022-06-29 ENCOUNTER — Ambulatory Visit: Payer: Medicare Other | Attending: Cardiology | Admitting: Cardiology

## 2022-06-29 ENCOUNTER — Encounter: Payer: Self-pay | Admitting: Cardiology

## 2022-06-29 VITALS — BP 120/60 | HR 64 | Ht 68.0 in | Wt 178.8 lb

## 2022-06-29 DIAGNOSIS — I480 Paroxysmal atrial fibrillation: Secondary | ICD-10-CM

## 2022-06-29 DIAGNOSIS — R0789 Other chest pain: Secondary | ICD-10-CM | POA: Diagnosis not present

## 2022-06-29 DIAGNOSIS — I495 Sick sinus syndrome: Secondary | ICD-10-CM | POA: Diagnosis not present

## 2022-06-29 MED ORDER — LISINOPRIL 2.5 MG PO TABS: 2.5000 mg | ORAL_TABLET | Freq: Every day | ORAL | 1 refills | Status: DC

## 2022-06-29 NOTE — Progress Notes (Signed)
Clinical Summary [name] is a 76 y.o.male seen today for follow up of the following medical problems.    1. PAF - noted during 10/2018 admission - had not been on anticoag due to frequent falls previously.    - since pacemaker placement falls have resolved, started on eliquis 5mg  bid.      - no recent palpitations - compliant with meds   2. Bradycardia w/ pacemaker - chronic according ot notes. 10/2018 admissoin had some rates to 40s and 50s - heart monitor showed bradycardia and post termination pauses up to 3.2 seconds    06/26/19 pacemaker [employer] medical assurity dual chamber placed     04/2022 normal device check      3.Chest pain - extensive prior workup including cath in 2010 and stress test 2014 - admit 10/2018 with negative enzymes and echo - 04/2019 stress test no ischemia   -seen in ER yesterday with chest pain - trop neg x 2. EKG SR, no acute ischemic changes - came one at rest, felt nauseous and layed down and chest started hurting. Midleft chest, stabbing pain, 5/10 in severity. Not positional. +SOB. Pain lasted about 1 hour, self resolved.   11/2020 nuclear stress: inferior/inferoseptal infarct. Mild to mod peri-infarct ischemia apical.   -started on imdur 15 mg daily Some ongoing chest pressure at times. Better with NG - no recent chest pains.   - some chest pains at times, about once a month - compliant with meds    4. Orthostatic dizziness -Improved since stopping lisinopril   - rare infrequent symptoms.   - working to stay well hydrated - appears back on lisinopril, would assume for his CKD>      Prior cardiology notes   Prevoiusly followed by [facility] cardiology. From notes had normal cardiac monitor 2016. echocardiogram October 2014 ejection fraction 50-55%, G1DD, mild left atrial margin, 1+ AR, 1+ TR, no significant pericardial effusion -Echocardiogram interpreted by Dr. Alyse Low [facility] office), September 2014, preserved ejection fraction,  small pericardial effusion, 1+ MR, 2+ AR  -Event monitor September 2014 sinus rhythm, occasional PVCs, nonsustained PAT, and one 7 beat run of a wide QRS complex at 110-120 bpm, with minimal changes QRS axis, and QRS morphology not similar to documented PVCs.  2. Heart catheterization 2010 nonobstructive coronary disease  -exercise Cardiolite stress test negative for ischemia with ejection fraction 61% October 2014 Past Medical History:  Diagnosis Date   Alcohol use    Anxiety    Chest pain    Coronary artery disease    a. Nonobstructive CAD by cath 2010 with negative nuclear stress test 05/2013.   Depression    Diabetes mellitus    Hyperlipidemia    Hypertension    Hypothyroidism    Kidney problem    a. 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   Mild aortic insufficiency    PAT (paroxysmal atrial tachycardia) (HCC)    Pericardial effusion    a. by echo 2014.   Pulmonary nodules    resolved   PVC's (premature ventricular contractions)    Stroke (cerebrum) (HCC)    Stroke due to embolism of left carotid artery ([place]) 03/24/2016   Left caudate head stroke   Wide-complex tachycardia    a. per [name] note: Event monitor September 2014 sinus rhythm, occasional PVCs, nonsustained PAT, and one 7 beat run of a wide QRS complex at 110-120 bpm, with minimal changes QRS axis, and QRS morphology not similar to documented PVCs.  Allergies  Allergen Reactions   Morphine Other (See Comments)    Affected the heart- was told to not take this again   Tape Other (See Comments)    Certain tapes TEAR THE SKIN   Sulfonamide Derivatives Rash and Other (See Comments)    Blistering on the skin     Current Outpatient Medications  Medication Sig Dispense Refill   ACCU-CHEK GUIDE test strip USE TO TEST TWICE DAILY     acetaminophen (TYLENOL) 500 MG tablet Take 500 mg by mouth every 8 (eight) hours as needed for moderate pain or headache.     albuterol (VENTOLIN HFA) 108 (90 Base) MCG/ACT  inhaler Inhale 1 puff into the lungs every 6 (six) hours as needed for wheezing. (Patient not taking: Reported on 04/19/2022)     ALPRAZolam (XANAX) 1 MG tablet Take 1 mg by mouth 3 (three) times daily.  2   apixaban (ELIQUIS) 5 MG TABS tablet TAKE 1 TABLET BY MOUTH TWICE DAILY 60 tablet 11   atorvastatin (LIPITOR) 40 MG tablet Take 40 mg by mouth daily.     cyanocobalamin (VITAMIN B12) 1000 MCG tablet Take 1,000 mcg by mouth daily.     DULoxetine (CYMBALTA) 30 MG capsule Take 30 mg by mouth daily.     FARXIGA 10 MG TABS tablet Take 10 mg by mouth daily.     gabapentin (NEURONTIN) 600 MG tablet Take 600 mg by mouth with breakfast, with lunch, and with evening meal. 300 in the morning, 600 in afternoon and evening.  1   glimepiride (AMARYL) 2 MG tablet Take 2 mg by mouth daily.     isosorbide mononitrate (IMDUR) 30 MG 24 hr tablet TAKE 1 TABLET BY MOUTH EVERY DAY 90 tablet 1   levothyroxine (SYNTHROID) 137 MCG tablet Take 137 mcg by mouth daily.     lisinopril (ZESTRIL) 5 MG tablet Take 5 mg by mouth daily.     nitroGLYCERIN (NITROSTAT) 0.4 MG SL tablet Place 0.4 mg under the tongue every 5 (five) minutes as needed for chest pain.      omega-3 acid ethyl esters (LOVAZA) 1 g capsule Take 2 g by mouth 2 (two) times daily.  12   omeprazole (PRILOSEC) 40 MG capsule Take 40 mg by mouth daily before breakfast.     oxyCODONE-acetaminophen (PERCOCET) 10-325 MG tablet Take 1 tablet by mouth every 4 (four) hours as needed for pain.  0   potassium chloride (MICRO-K) 10 MEQ CR capsule Take 10 mEq by mouth daily.     No current facility-administered medications for this visit.     Past Surgical History:  Procedure Laterality Date   BACK SURGERY  1996   BREAST LUMPECTOMY Left [place]   COLONOSCOPY  10/18/2014   Dr. Britta Mccreedy, tubular adenoma in the ascending and distal transverse colon, hyperplastic polyp in sigmoid colon.  Recommended 3-5-year repeat.   COLONOSCOPY WITH PROPOFOL N/A  03/26/2022   Surgeon: Daneil Dolin, MD; normal exam.  Repeat in 5 years due to personal history of polyps.   ESOPHAGOGASTRODUODENOSCOPY (EGD) WITH PROPOFOL N/A 03/26/2022   Surgeon: Daneil Dolin, MD; Normal esophagus dilated, small hiatal hernia, normal stomach and examined duodenum.   MALONEY DILATION N/A 03/26/2022   Procedure: Venia Minks DILATION;  Surgeon: Daneil Dolin, MD;  Location: [facility];  Service: Endoscopy;  Laterality: N/A;   MANDIBLE SURGERY Right 1969   PACEMAKER IMPLANT N/A 06/26/2019   Procedure: PACEMAKER IMPLANT;  Surgeon: Thompson Grayer, MD;  Location: [place] CV LAB;  Service: Cardiovascular;  Laterality: N/A;     Allergies  Allergen Reactions   Morphine Other (See Comments)    Affected the heart- was told to not take this again   Tape Other (See Comments)    Certain tapes TEAR THE SKIN   Sulfonamide Derivatives Rash and Other (See Comments)    Blistering on the skin      Family History  Problem Relation Age of Onset   Lung cancer Mother    Diabetes Father    Bladder Cancer Father    Colon cancer Brother        Age 82, passed at age 36   Stroke Maternal Grandmother    Cancer Maternal Grandfather    Stroke Paternal Grandmother    Stroke Paternal Grandfather    Colon cancer Maternal Aunt        70s   Colon cancer Cousin      Social History Mr. View reports that he quit smoking about 39 years ago. His smoking use included cigarettes. He has never used smokeless tobacco. Mr. Vignali reports that he does not currently use alcohol.   Review of Systems CONSTITUTIONAL: No weight loss, fever, chills, weakness or fatigue.  HEENT: Eyes: No visual loss, blurred vision, double vision or yellow sclerae.No hearing loss, sneezing, congestion, runny nose or sore throat.  SKIN: No rash or itching.  CARDIOVASCULAR: per hpi RESPIRATORY: No shortness of breath, cough or sputum.  GASTROINTESTINAL: No anorexia, nausea, vomiting or diarrhea. No  abdominal pain or blood.  GENITOURINARY: No burning on urination, no polyuria NEUROLOGICAL: No headache, dizziness, syncope, paralysis, ataxia, numbness or tingling in the extremities. No change in bowel or bladder control.  MUSCULOSKELETAL: No muscle, back pain, joint pain or stiffness.  LYMPHATICS: No enlarged nodes. No history of splenectomy.  PSYCHIATRIC: No history of depression or anxiety.  ENDOCRINOLOGIC: No reports of sweating, cold or heat intolerance. No polyuria or polydipsia.  Marland Kitchen   Physical Examination Today's Vitals   06/29/22 1403  BP: 120/60  Pulse: 64  SpO2: 95%  Weight: 178 lb 12.8 oz (81.1 kg)  Height: 5\' 8"  (1.727 m)   Body mass index is 27.19 kg/m.  Gen: resting comfortably, no acute distress HEENT: no scleral icterus, pupils equal round and reactive, no palptable cervical adenopathy,  CV:RRR, no m/r/g no jvd Resp: Clear to auscultation bilaterally GI: abdomen is soft, non-tender, non-distended, normal bowel sounds, no hepatosplenomegaly MSK: extremities are warm, no edema.  Skin: warm, no rash Neuro:  no focal deficits Psych: appropriate affect   Diagnostic Studies  01/2019 event monitor 7 day event monitor. Date is available from 83% of planned monitored time Min HR 52, Max HR 111, Avg HR 65 Sinus rhythm with episodes of aflutter with variable conduction, no significant RVR. Can have postconversion pause up to 3.2 seconds. No symptoms reported     10/2018 echo IMPRESSIONS     1. The left ventricle has normal systolic function with an ejection  fraction of 60-65%. The cavity size was normal. There is mild concentric  left ventricular hypertrophy. Left ventricular diastolic Doppler  parameters are consistent with impaired  relaxation. Indeterminate filling pressures No evidence of left  ventricular regional wall motion abnormalities.   2. The right ventricle has normal systolic function. The cavity was  normal. There is no increase in right  ventricular wall thickness.   3. The tricuspid valve is grossly normal.   4. The aortic valve is tricuspid Aortic valve  regurgitation is mild by  color flow Doppler.   5. There is mild dilatation of the aortic root measuring 38 mm.   6. The interatrial septum was not well visualized.      04/2019 nuclear stress There was no ST segment deviation noted during stress. Findings consistent with prior inferior/inferoseptal/inferoapical myocardial infarction. No current ischemia This is a low risk study. The left ventricular ejection fraction is normal (55-65%).   11/2020 echo  1. Left ventricular ejection fraction, by estimation, is 55 to 60%. The  left ventricle has normal function. The left ventricle has no regional  wall motion abnormalities. There is mild left ventricular hypertrophy.  Left ventricular diastolic parameters  are consistent with Grade I diastolic dysfunction (impaired relaxation).  Elevated left atrial pressure.   2. Right ventricular systolic function is normal. The right ventricular  size is normal.   3. The pericardial effusion is circumferential.   4. The mitral valve is normal in structure. No evidence of mitral valve  regurgitation. No evidence of mitral stenosis.   5. The aortic valve is tricuspid. Aortic valve regurgitation is mild. No  aortic stenosis is present.   6. The inferior vena cava is normal in size with greater than 50%  respiratory variability, suggesting right atrial pressure of 3 mmHg.     11/2020 nuclear stress There was no ST segment deviation noted during stress. Findings consistent with prior inferior/inferoseptal myocardial infarction. There is a small apical infarct with mild to moderate peri-infarct ischemia. The left ventricular ejection fraction is normal (55-65%). Low to intermediate risk study.   Assessment and Plan  1. Chest pain - long history of chest pain symptoms with negative workups in the past including cath, multiple stress  tests  - most recent stress test low to intermediate risk, working with medical therapy to manage symptoms - mild infrequent symptoms, continue to monitor. High threshold for cath given renal dysfunction.      2. Afib/acquired thrombophilia -no symptoms, continue current meds including eliquis for stroke prevention.    3.Pacemaker - continue to follow with EP, recent normal device check.       Arnoldo Lenis, M.D

## 2022-06-29 NOTE — Patient Instructions (Addendum)
Medication Instructions:  Your physician has recommended you make the following change in your medication:  Decrease lisinopril to 2.5 mg once a day Continue all other medications as directed  Labwork: none  Testing/Procedures: none  Follow-Up:  Your physician recommends that you schedule a follow-up appointment in: 6 months  Any Other Special Instructions Will Be Listed Below (If Applicable).  If you need a refill on your cardiac medications before your next appointment, please call your pharmacy.

## 2022-07-05 ENCOUNTER — Other Ambulatory Visit: Payer: Self-pay | Admitting: Cardiology

## 2022-07-08 DIAGNOSIS — I129 Hypertensive chronic kidney disease with stage 1 through stage 4 chronic kidney disease, or unspecified chronic kidney disease: Secondary | ICD-10-CM | POA: Diagnosis not present

## 2022-07-08 DIAGNOSIS — E1165 Type 2 diabetes mellitus with hyperglycemia: Secondary | ICD-10-CM | POA: Diagnosis not present

## 2022-07-08 DIAGNOSIS — E039 Hypothyroidism, unspecified: Secondary | ICD-10-CM | POA: Diagnosis not present

## 2022-07-13 DIAGNOSIS — H35033 Hypertensive retinopathy, bilateral: Secondary | ICD-10-CM | POA: Diagnosis not present

## 2022-07-14 DIAGNOSIS — K219 Gastro-esophageal reflux disease without esophagitis: Secondary | ICD-10-CM | POA: Diagnosis not present

## 2022-07-14 DIAGNOSIS — I129 Hypertensive chronic kidney disease with stage 1 through stage 4 chronic kidney disease, or unspecified chronic kidney disease: Secondary | ICD-10-CM | POA: Diagnosis not present

## 2022-07-14 DIAGNOSIS — D696 Thrombocytopenia, unspecified: Secondary | ICD-10-CM | POA: Diagnosis not present

## 2022-07-14 DIAGNOSIS — E1165 Type 2 diabetes mellitus with hyperglycemia: Secondary | ICD-10-CM | POA: Diagnosis not present

## 2022-07-14 DIAGNOSIS — E875 Hyperkalemia: Secondary | ICD-10-CM | POA: Diagnosis not present

## 2022-07-14 DIAGNOSIS — E1142 Type 2 diabetes mellitus with diabetic polyneuropathy: Secondary | ICD-10-CM | POA: Diagnosis not present

## 2022-07-14 DIAGNOSIS — E782 Mixed hyperlipidemia: Secondary | ICD-10-CM | POA: Diagnosis not present

## 2022-07-14 DIAGNOSIS — E039 Hypothyroidism, unspecified: Secondary | ICD-10-CM | POA: Diagnosis not present

## 2022-07-14 DIAGNOSIS — M545 Low back pain, unspecified: Secondary | ICD-10-CM | POA: Diagnosis not present

## 2022-07-14 DIAGNOSIS — R600 Localized edema: Secondary | ICD-10-CM | POA: Diagnosis not present

## 2022-07-14 DIAGNOSIS — G894 Chronic pain syndrome: Secondary | ICD-10-CM | POA: Diagnosis not present

## 2022-07-21 ENCOUNTER — Encounter: Payer: Self-pay | Admitting: Cardiovascular Disease

## 2022-08-03 ENCOUNTER — Encounter: Payer: Self-pay | Admitting: Gastroenterology

## 2022-08-03 ENCOUNTER — Ambulatory Visit (INDEPENDENT_AMBULATORY_CARE_PROVIDER_SITE_OTHER): Payer: Medicare Other | Admitting: Gastroenterology

## 2022-08-03 VITALS — BP 115/59 | HR 60 | Temp 97.4°F | Ht 68.0 in | Wt 179.8 lb

## 2022-08-03 DIAGNOSIS — K219 Gastro-esophageal reflux disease without esophagitis: Secondary | ICD-10-CM | POA: Diagnosis not present

## 2022-08-03 DIAGNOSIS — E538 Deficiency of other specified B group vitamins: Secondary | ICD-10-CM

## 2022-08-03 DIAGNOSIS — K5900 Constipation, unspecified: Secondary | ICD-10-CM | POA: Diagnosis not present

## 2022-08-03 NOTE — Patient Instructions (Signed)
Continue omeprazole daily for acid reflux. If you feel like you are not moving your bowels effectively, you can add Miralax one capful daily as needed.  Return to the office as needed.

## 2022-08-03 NOTE — Progress Notes (Signed)
GI Office Note    Referring Provider: Benita Stabile, MD Primary Care Physician:  Benita Stabile, MD  Primary Gastroenterologist: Roetta Sessions, MD   Chief Complaint   Chief Complaint  Patient presents with   Follow-up    Doing well.     History of Present Illness   [name] is a 76 y.o. male presenting today for follow-up.  Last seen in August 2023.  History of GERD, dysphagia, constipation, abdominal pain.  Since his last ov, he has gained 4 pounds. His appetite is better. Patient states he is feeling great. He no longer has abdominal pain. Heartburn well controlled. Dysphagia resolved. He continues to have constipation issues with infrequent stools, once per week. States this is his normal. No melena, brbpr.   CT abdomen pelvis June 2023: IMPRESSION: 1. No acute abnormality identified in the abdomen and pelvis. The appendix is normal. 2. Nonobstructing right kidney stones. 3. Fatty infiltration of liver.     EGD completed 03/26/2022 revealed normal esophagus s/p empiric dilation. 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   Colonoscopy 01/2022: - The entire examined colon is normal. - The distal rectum and anal verge are normal on retroflexion view. Redundant colon. - No specimens collected.   Medications   Current Outpatient Medications  Medication Sig Dispense Refill   ACCU-CHEK GUIDE test strip USE TO TEST TWICE DAILY     acetaminophen (TYLENOL) 500 MG tablet Take 500 mg by mouth every 8 (eight) hours as needed for moderate pain or headache.     albuterol (VENTOLIN HFA) 108 (90 Base) MCG/ACT inhaler Inhale 1 puff into the lungs every 6 (six) hours as needed for wheezing.     ALPRAZolam (XANAX) 1 MG tablet Take 1 mg by mouth 3 (three) times daily.  2   apixaban (ELIQUIS) 5 MG TABS tablet TAKE 1 TABLET BY MOUTH TWICE DAILY 60 tablet 11   atorvastatin (LIPITOR) 40 MG tablet Take 40 mg by mouth daily.     DULoxetine (CYMBALTA) 30 MG capsule Take 30 mg by mouth  daily.     FARXIGA 10 MG TABS tablet Take 10 mg by mouth daily.     gabapentin (NEURONTIN) 300 MG capsule Take 300 mg by mouth 3 (three) times daily.  1   glimepiride (AMARYL) 2 MG tablet Take 2 mg by mouth daily.     isosorbide mononitrate (IMDUR) 30 MG 24 hr tablet TAKE 1 TABLET BY MOUTH EVERY DAY 90 tablet 3   levothyroxine (SYNTHROID) 137 MCG tablet Take 137 mcg by mouth daily.     lisinopril (ZESTRIL) 5 MG tablet Take 5 mg by mouth daily.     nitroGLYCERIN (NITROSTAT) 0.4 MG SL tablet Place 0.4 mg under the tongue every 5 (five) minutes as needed for chest pain.      omega-3 acid ethyl esters (LOVAZA) 1 g capsule Take 2 g by mouth 2 (two) times daily.  12   omeprazole (PRILOSEC) 40 MG capsule Take 40 mg by mouth daily before breakfast.     potassium chloride (MICRO-K) 10 MEQ CR capsule Take 10 mEq by mouth daily.     No current facility-administered medications for this visit.    Allergies   Allergies as of 08/03/2022 - Review Complete 08/03/2022  Allergen Reaction Noted   Morphine Other (See Comments) 05/23/2013   Tape Other (See Comments) 08/11/2020   Sulfonamide derivatives Rash and Other (See Comments) 01/09/2009       Review of Systems  General: Negative for anorexia, weight loss, fever, chills, fatigue, weakness. ENT: Negative for hoarseness, difficulty swallowing , nasal congestion. CV: Negative for chest pain, angina, palpitations, dyspnea on exertion, peripheral edema.  Respiratory: Negative for dyspnea at rest, dyspnea on exertion, cough, sputum, wheezing.  GI: See history of present illness. GU:  Negative for dysuria, hematuria, urinary incontinence, urinary frequency, nocturnal urination.  Endo: Negative for unusual weight change.     Physical Exam   BP (!) 115/59 (BP Location: Right Arm, Patient Position: Sitting, Cuff Size: Normal)   Pulse 60   Temp (!) 97.4 F (36.3 C) (Oral)   Ht 5\' 8"  (1.727 m)   Wt 179 lb 12.8 oz (81.6 kg)   SpO2 95%   BMI 27.34  kg/m    General: Well-nourished, well-developed in no acute distress.  Eyes: No icterus. Mouth: Oropharyngeal mucosa moist and pink   Abdomen: Bowel sounds are normal, nontender, nondistended, no hepatosplenomegaly or masses,  no abdominal bruits or hernia , no rebound or guarding.  Rectal: not performed Extremities: No lower extremity edema. No clubbing or deformities. Neuro: Alert and oriented x 4   Skin: Warm and dry, no jaundice.   Psych: Alert and cooperative, normal mood and affect.  Labs   Lab Results  Component Value Date   CREATININE 1.61 (H) 04/21/2022   BUN 14 04/21/2022   NA 142 04/21/2022   K 4.0 04/21/2022   CL 106 04/21/2022   CO2 24 04/21/2022   Lab Results  Component Value Date   ALT 7 (L) 02/09/2022   AST 11 02/09/2022   ALKPHOS 78 10/28/2021   BILITOT 0.5 02/09/2022   Lab Results  Component Value Date   WBC 7.0 04/21/2022   HGB 14.3 04/21/2022   HCT 42.9 04/21/2022   MCV 93.9 04/21/2022   PLT 190 04/21/2022   Lab Results  Component Value Date   TSH 2.79 04/21/2022   Lab Results  Component Value Date   IRON 87 02/09/2022   TIBC 334 02/09/2022   FERRITIN 24 02/09/2022   Lab Results  Component Value Date   VITAMINB12 175 (L) 02/09/2022   Lab Results  Component Value Date   FOLATE 15.9 02/09/2022    Imaging Studies   No results found.  Assessment/Plan:   GERD/Dysphagia: doing very well. Reinforced antireflux measures.   Constipation: patient states his stools are at baseline, consistent with life long infrequent stools. He states he feels great. We discuss possibility of adding miralax once daily as needed to improve stool frequency, avoid hard stools and straining.   H/O B12 deficiency: was supplemented with oral vitamin B12 back in June. He did not have recheck with PCP. We will arrange.   Return office visit as needed.    July. Leanna Battles, MHS, PA-C [facility]

## 2022-08-15 ENCOUNTER — Telehealth: Payer: Self-pay | Admitting: Gastroenterology

## 2022-08-15 ENCOUNTER — Encounter: Payer: Self-pay | Admitting: Gastroenterology

## 2022-08-15 NOTE — Telephone Encounter (Signed)
[name], patient recently seen in office. I noted that he has history of B12 deficiency detected by [facility] in June. She recommended oral B12. I don't see that on his med list. He was supposed to have follow up labs with PCP for B12, these have not been done.  Please find out if he is taking oral B12. I have placed orders for him to have B12 level. Please let pt know.

## 2022-08-18 NOTE — Telephone Encounter (Signed)
Pt was made aware and verbalized understanding. Pt states that he is not currently taking the b12 that he forgets to take it but that he will start taking it. Pt will also go to the lab to have the b12 level drawn.

## 2022-08-19 ENCOUNTER — Ambulatory Visit (INDEPENDENT_AMBULATORY_CARE_PROVIDER_SITE_OTHER): Payer: Medicare Other

## 2022-08-19 DIAGNOSIS — I495 Sick sinus syndrome: Secondary | ICD-10-CM | POA: Diagnosis not present

## 2022-08-20 NOTE — Telephone Encounter (Signed)
Noted. Wait for pending labs.

## 2022-08-26 DIAGNOSIS — E538 Deficiency of other specified B group vitamins: Secondary | ICD-10-CM | POA: Diagnosis not present

## 2022-08-27 LAB — CUP PACEART REMOTE DEVICE CHECK
Battery Remaining Longevity: 97 mo
Battery Remaining Percentage: 79 %
Battery Voltage: 3.01 V
Brady Statistic AP VP Percent: 1 %
Brady Statistic AP VS Percent: 16 %
Brady Statistic AS VP Percent: 1 %
Brady Statistic AS VS Percent: 84 %
Brady Statistic RA Percent Paced: 16 %
Brady Statistic RV Percent Paced: 1 %
Date Time Interrogation Session: 20231228020012
Implantable Lead Connection Status: 753985
Implantable Lead Connection Status: 753985
Implantable Lead Implant Date: 20201103
Implantable Lead Implant Date: 20201103
Implantable Lead Location: 753859
Implantable Lead Location: 753860
Implantable Pulse Generator Implant Date: 20201103
Lead Channel Impedance Value: 410 Ohm
Lead Channel Impedance Value: 490 Ohm
Lead Channel Pacing Threshold Amplitude: 0.5 V
Lead Channel Pacing Threshold Amplitude: 0.75 V
Lead Channel Pacing Threshold Pulse Width: 0.5 ms
Lead Channel Pacing Threshold Pulse Width: 0.5 ms
Lead Channel Sensing Intrinsic Amplitude: 12 mV
Lead Channel Sensing Intrinsic Amplitude: 2 mV
Lead Channel Setting Pacing Amplitude: 2 V
Lead Channel Setting Pacing Amplitude: 2.5 V
Lead Channel Setting Pacing Pulse Width: 0.5 ms
Lead Channel Setting Sensing Sensitivity: 2 mV
Pulse Gen Model: 2272
Pulse Gen Serial Number: 9174806

## 2022-08-27 LAB — VITAMIN B12: Vitamin B-12: 236 pg/mL (ref 232–1245)

## 2022-08-30 ENCOUNTER — Other Ambulatory Visit: Payer: Self-pay

## 2022-08-30 DIAGNOSIS — E538 Deficiency of other specified B group vitamins: Secondary | ICD-10-CM

## 2022-09-06 NOTE — Progress Notes (Signed)
Remote pacemaker transmission.   

## 2022-10-21 DIAGNOSIS — I129 Hypertensive chronic kidney disease with stage 1 through stage 4 chronic kidney disease, or unspecified chronic kidney disease: Secondary | ICD-10-CM | POA: Diagnosis not present

## 2022-10-21 DIAGNOSIS — E039 Hypothyroidism, unspecified: Secondary | ICD-10-CM | POA: Diagnosis not present

## 2022-10-21 DIAGNOSIS — E1165 Type 2 diabetes mellitus with hyperglycemia: Secondary | ICD-10-CM | POA: Diagnosis not present

## 2022-10-28 DIAGNOSIS — Z0001 Encounter for general adult medical examination with abnormal findings: Secondary | ICD-10-CM | POA: Diagnosis not present

## 2022-10-28 DIAGNOSIS — M545 Low back pain, unspecified: Secondary | ICD-10-CM | POA: Diagnosis not present

## 2022-10-28 DIAGNOSIS — E1142 Type 2 diabetes mellitus with diabetic polyneuropathy: Secondary | ICD-10-CM | POA: Diagnosis not present

## 2022-10-28 DIAGNOSIS — G894 Chronic pain syndrome: Secondary | ICD-10-CM | POA: Diagnosis not present

## 2022-10-28 DIAGNOSIS — E039 Hypothyroidism, unspecified: Secondary | ICD-10-CM | POA: Diagnosis not present

## 2022-10-28 DIAGNOSIS — K219 Gastro-esophageal reflux disease without esophagitis: Secondary | ICD-10-CM | POA: Diagnosis not present

## 2022-10-28 DIAGNOSIS — D696 Thrombocytopenia, unspecified: Secondary | ICD-10-CM | POA: Diagnosis not present

## 2022-10-28 DIAGNOSIS — E782 Mixed hyperlipidemia: Secondary | ICD-10-CM | POA: Diagnosis not present

## 2022-10-28 DIAGNOSIS — E1122 Type 2 diabetes mellitus with diabetic chronic kidney disease: Secondary | ICD-10-CM | POA: Diagnosis not present

## 2022-10-28 DIAGNOSIS — E1165 Type 2 diabetes mellitus with hyperglycemia: Secondary | ICD-10-CM | POA: Diagnosis not present

## 2022-10-28 DIAGNOSIS — I129 Hypertensive chronic kidney disease with stage 1 through stage 4 chronic kidney disease, or unspecified chronic kidney disease: Secondary | ICD-10-CM | POA: Diagnosis not present

## 2022-11-18 ENCOUNTER — Ambulatory Visit (INDEPENDENT_AMBULATORY_CARE_PROVIDER_SITE_OTHER): Payer: 59

## 2022-11-18 DIAGNOSIS — I495 Sick sinus syndrome: Secondary | ICD-10-CM

## 2022-11-18 LAB — CUP PACEART REMOTE DEVICE CHECK
Battery Remaining Longevity: 95 mo
Battery Remaining Percentage: 77 %
Battery Voltage: 3.01 V
Brady Statistic AP VP Percent: 1 %
Brady Statistic AP VS Percent: 16 %
Brady Statistic AS VP Percent: 1 %
Brady Statistic AS VS Percent: 84 %
Brady Statistic RA Percent Paced: 16 %
Brady Statistic RV Percent Paced: 1 %
Date Time Interrogation Session: 20240328020034
Implantable Lead Connection Status: 753985
Implantable Lead Connection Status: 753985
Implantable Lead Implant Date: 20201103
Implantable Lead Implant Date: 20201103
Implantable Lead Location: 753859
Implantable Lead Location: 753860
Implantable Pulse Generator Implant Date: 20201103
Lead Channel Impedance Value: 410 Ohm
Lead Channel Impedance Value: 490 Ohm
Lead Channel Pacing Threshold Amplitude: 0.5 V
Lead Channel Pacing Threshold Amplitude: 0.75 V
Lead Channel Pacing Threshold Pulse Width: 0.5 ms
Lead Channel Pacing Threshold Pulse Width: 0.5 ms
Lead Channel Sensing Intrinsic Amplitude: 1.6 mV
Lead Channel Sensing Intrinsic Amplitude: 12 mV
Lead Channel Setting Pacing Amplitude: 2 V
Lead Channel Setting Pacing Amplitude: 2.5 V
Lead Channel Setting Pacing Pulse Width: 0.5 ms
Lead Channel Setting Sensing Sensitivity: 2 mV
Pulse Gen Model: 2272
Pulse Gen Serial Number: 9174806

## 2022-11-29 ENCOUNTER — Other Ambulatory Visit: Payer: Self-pay | Admitting: Cardiology

## 2022-11-29 DIAGNOSIS — I480 Paroxysmal atrial fibrillation: Secondary | ICD-10-CM

## 2022-11-30 NOTE — Telephone Encounter (Signed)
Prescription refill request for Eliquis received. Indication: AF Last office visit: 06/29/22  Dominga Ferry MD Scr: 1.61 on 04/21/22  Epic Age: 77 Weight: 81.1kg  Based on above findings Eliquis 5mg  twice daily is the appropriate dose.  Refill approved.

## 2022-12-20 ENCOUNTER — Other Ambulatory Visit: Payer: Self-pay

## 2022-12-20 DIAGNOSIS — E538 Deficiency of other specified B group vitamins: Secondary | ICD-10-CM

## 2022-12-23 NOTE — Progress Notes (Signed)
Remote pacemaker transmission.   

## 2022-12-27 ENCOUNTER — Other Ambulatory Visit: Payer: Self-pay | Admitting: Cardiology

## 2022-12-27 NOTE — Telephone Encounter (Signed)
Contacted to confirm lisinopril dose. Patient verified that he takes lisinopril 2.5 mg daily. Refill sent to [facility].

## 2022-12-29 DIAGNOSIS — E538 Deficiency of other specified B group vitamins: Secondary | ICD-10-CM | POA: Diagnosis not present

## 2022-12-30 LAB — VITAMIN B12: Vitamin B-12: 229 pg/mL — ABNORMAL LOW (ref 232–1245)

## 2023-01-03 ENCOUNTER — Encounter: Payer: Self-pay | Admitting: Cardiology

## 2023-01-03 ENCOUNTER — Ambulatory Visit: Payer: 59 | Attending: Cardiology | Admitting: Cardiology

## 2023-01-03 VITALS — BP 120/78 | HR 56 | Ht 68.0 in | Wt 181.4 lb

## 2023-01-03 DIAGNOSIS — R0789 Other chest pain: Secondary | ICD-10-CM | POA: Diagnosis not present

## 2023-01-03 DIAGNOSIS — Z950 Presence of cardiac pacemaker: Secondary | ICD-10-CM

## 2023-01-03 DIAGNOSIS — I480 Paroxysmal atrial fibrillation: Secondary | ICD-10-CM

## 2023-01-03 DIAGNOSIS — D6869 Other thrombophilia: Secondary | ICD-10-CM

## 2023-01-03 MED ORDER — ISOSORBIDE MONONITRATE ER 30 MG PO TB24: 15.0000 mg | ORAL_TABLET | Freq: Every day | ORAL | Status: DC

## 2023-01-03 NOTE — Addendum Note (Signed)
Addended by: Lesle Chris on: 01/03/2023 02:55 PM   Modules accepted: Orders

## 2023-01-03 NOTE — Patient Instructions (Signed)
Medication Instructions:   Decrease Imdur to 15mg  daily  Continue all other medications.     Labwork:  none  Testing/Procedures:  none  Follow-Up:  6 months   Any Other Special Instructions Will Be Listed Below (If Applicable).   If you need a refill on your cardiac medications before your next appointment, please call your pharmacy.

## 2023-01-03 NOTE — Progress Notes (Signed)
Clinical Summary Mr. Dutkiewicz is a 77 y.o.male seen today for follow up of the following medical problems.    1. PAF - noted during 10/2018 admission - had not been on anticoag due to frequent falls previously.    - since pacemaker placement falls have resolved, started on eliquis 5mg  bid.      No recent palpitations - compliant with meds. No bleeding one eliquis.    2. Bradycardia w/ pacemaker - chronic according ot notes. 10/2018 admissoin had some rates to 40s and 50s - heart monitor showed bradycardia and post termination pauses up to 3.2 seconds    06/26/19 pacemaker [employer] Jude medical assurity dual chamber placed     10/2022 normal device function, <1% afib     3.Chest pain - extensive prior workup including cath in 2010 and stress test 2014 - admit 10/2018 with negative enzymes and echo - 04/2019 stress test no ischemia  11/2020 nuclear stress: inferior/inferoseptal infarct. Mild to mod peri-infarct ischemia apical.   -started on imdur 15 mg daily    - no recent symptoms.     4. Orthostatic dizziness -Improved since stopping lisinopril   - rare infrequent symptoms.   - working to stay well hydrated - appears back on lisinopril, would assume for his CKD>   - few times a month -      5. CKD -followed by nephrology    Prior cardiology notes   Prevoiusly followed by [facility] cardiology. From notes had normal cardiac monitor 2016. echocardiogram October 2014 ejection fraction 50-55%, G1DD, mild left atrial margin, 1+ AR, 1+ TR, no significant pericardial effusion -Echocardiogram interpreted by Dr. Loletha Carrow [facility] office), September 2014, preserved ejection fraction, small pericardial effusion, 1+ MR, 2+ AR  -Event monitor September 2014 sinus rhythm, occasional PVCs, nonsustained PAT, and one 7 beat run of a wide QRS complex at 110-120 bpm, with minimal changes QRS axis, and QRS morphology not similar to documented PVCs.  2. Heart catheterization 2010  nonobstructive coronary disease  -exercise Cardiolite stress test negative for ischemia with ejection fraction 61% October 2014 Past Medical History:  Diagnosis Date   Alcohol use    Anxiety    Chest pain    Coronary artery disease    a. Nonobstructive CAD by cath 2010 with negative nuclear stress test 05/2013.   Depression    Diabetes mellitus    Hyperlipidemia    Hypertension    Hypothyroidism    Kidney problem    a. 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   Mild aortic insufficiency    PAT (paroxysmal atrial tachycardia)    Pericardial effusion    a. by echo 2014.   Pulmonary nodules    resolved   PVC's (premature ventricular contractions)    Stroke (cerebrum) (HCC)    Stroke due to embolism of left carotid artery (HCC) 03/24/2016   Left caudate head stroke   Wide-complex tachycardia    a. per [name] note: Event monitor September 2014 sinus rhythm, occasional PVCs, nonsustained PAT, and one 7 beat run of a wide QRS complex at 110-120 bpm, with minimal changes QRS axis, and QRS morphology not similar to documented PVCs.      Allergies  Allergen Reactions   Morphine Other (See Comments)    Affected the heart- was told to not take this again   Tape Other (See Comments)    Certain tapes TEAR THE SKIN   Sulfonamide Derivatives Rash and Other (See Comments)    Blistering  on the skin     Current Outpatient Medications  Medication Sig Dispense Refill   ACCU-CHEK GUIDE test strip USE TO TEST TWICE DAILY     acetaminophen (TYLENOL) 500 MG tablet Take 500 mg by mouth every 8 (eight) hours as needed for moderate pain or headache.     albuterol (VENTOLIN HFA) 108 (90 Base) MCG/ACT inhaler Inhale 1 puff into the lungs every 6 (six) hours as needed for wheezing.     ALPRAZolam (XANAX) 1 MG tablet Take 1 mg by mouth 3 (three) times daily.  2   apixaban (ELIQUIS) 5 MG TABS tablet TAKE 1 TABLET BY MOUTH TWICE DAILY 60 tablet 5   atorvastatin (LIPITOR) 40 MG tablet Take 40 mg by  mouth daily.     DULoxetine (CYMBALTA) 30 MG capsule Take 30 mg by mouth daily.     FARXIGA 10 MG TABS tablet Take 10 mg by mouth daily.     gabapentin (NEURONTIN) 300 MG capsule Take 300 mg by mouth 3 (three) times daily.  1   glimepiride (AMARYL) 2 MG tablet Take 2 mg by mouth daily.     isosorbide mononitrate (IMDUR) 30 MG 24 hr tablet TAKE 1 TABLET BY MOUTH EVERY DAY 90 tablet 3   levothyroxine (SYNTHROID) 137 MCG tablet Take 137 mcg by mouth daily.     lisinopril (ZESTRIL) 2.5 MG tablet TAKE 1 TABLET BY MOUTH DAILY 90 tablet 1   nitroGLYCERIN (NITROSTAT) 0.4 MG SL tablet Place 0.4 mg under the tongue every 5 (five) minutes as needed for chest pain.      omega-3 acid ethyl esters (LOVAZA) 1 g capsule Take 2 g by mouth 2 (two) times daily.  12   omeprazole (PRILOSEC) 40 MG capsule Take 40 mg by mouth daily before breakfast.     potassium chloride (MICRO-K) 10 MEQ CR capsule Take 10 mEq by mouth daily.     No current facility-administered medications for this visit.     Past Surgical History:  Procedure Laterality Date   BACK SURGERY  1996   BREAST LUMPECTOMY Left 1979   CIRCUMCISION  1980   COLONOSCOPY  10/18/2014   Dr. Teena Dunk, tubular adenoma in the ascending and distal transverse colon, hyperplastic polyp in sigmoid colon.  Recommended 3-5-year repeat.   COLONOSCOPY WITH PROPOFOL N/A 03/26/2022   Surgeon: Corbin Ade, MD; normal exam.  Repeat in 5 years due to personal history of polyps.   ESOPHAGOGASTRODUODENOSCOPY (EGD) WITH PROPOFOL N/A 03/26/2022   Surgeon: Corbin Ade, MD; Normal esophagus dilated, small hiatal hernia, normal stomach and examined duodenum.   MALONEY DILATION N/A 03/26/2022   Procedure: Elease Hashimoto DILATION;  Surgeon: Corbin Ade, MD;  Location: [facility];  Service: Endoscopy;  Laterality: N/A;   MANDIBLE SURGERY Right 1969   PACEMAKER IMPLANT N/A 06/26/2019   Procedure: PACEMAKER IMPLANT;  Surgeon: Hillis Range, MD;  Location: [facility];  Service: Cardiovascular;  Laterality: N/A;     Allergies  Allergen Reactions   Morphine Other (See Comments)    Affected the heart- was told to not take this again   Tape Other (See Comments)    Certain tapes TEAR THE SKIN   Sulfonamide Derivatives Rash and Other (See Comments)    Blistering on the skin      Family History  Problem Relation Age of Onset   Lung cancer Mother    Diabetes Father    Bladder Cancer Father    Colon cancer Brother  Age 81, passed at age 5   Stroke Maternal Grandmother    Cancer Maternal Grandfather    Stroke Paternal Grandmother    Stroke Paternal Grandfather    Colon cancer Maternal Aunt        28s   Colon cancer Cousin      Social History Mr. Laue reports that he quit smoking about 40 years ago. His smoking use included cigarettes. He has never used smokeless tobacco. Mr. Skura reports that he does not currently use alcohol.   Review of Systems CONSTITUTIONAL: No weight loss, fever, chills, weakness or fatigue.  HEENT: Eyes: No visual loss, blurred vision, double vision or yellow sclerae.No hearing loss, sneezing, congestion, runny nose or sore throat.  SKIN: No rash or itching.  CARDIOVASCULAR: per hpi RESPIRATORY: No shortness of breath, cough or sputum.  GASTROINTESTINAL: No anorexia, nausea, vomiting or diarrhea. No abdominal pain or blood.  GENITOURINARY: No burning on urination, no polyuria NEUROLOGICAL: No headache, dizziness, syncope, paralysis, ataxia, numbness or tingling in the extremities. No change in bowel or bladder control.  MUSCULOSKELETAL: No muscle, back pain, joint pain or stiffness.  LYMPHATICS: No enlarged nodes. No history of splenectomy.  PSYCHIATRIC: No history of depression or anxiety.  ENDOCRINOLOGIC: No reports of sweating, cold or heat intolerance. No polyuria or polydipsia.  Marland Kitchen   Physical Examination Today's Vitals   01/03/23 1422  BP: 120/78  Pulse: (!) 56  SpO2: 98%  Weight:  181 lb 6.4 oz (82.3 kg)  Height: 5\' 8"  (1.727 m)   Body mass index is 27.58 kg/m.  Gen: resting comfortably, no acute distress HEENT: no scleral icterus, pupils equal round and reactive, no palptable cervical adenopathy,  CV: RRR, no mrg, no jvd Resp: Clear to auscultation bilaterally GI: abdomen is soft, non-tender, non-distended, normal bowel sounds, no hepatosplenomegaly MSK: extremities are warm, no edema.  Skin: warm, no rash Neuro:  no focal deficits Psych: appropriate affect   Diagnostic Studies 01/2019 event monitor 7 day event monitor. Date is available from 83% of planned monitored time Min HR 52, Max HR 111, Avg HR 65 Sinus rhythm with episodes of aflutter with variable conduction, no significant RVR. Can have postconversion pause up to 3.2 seconds. No symptoms reported     10/2018 echo IMPRESSIONS     1. The left ventricle has normal systolic function with an ejection  fraction of 60-65%. The cavity size was normal. There is mild concentric  left ventricular hypertrophy. Left ventricular diastolic Doppler  parameters are consistent with impaired  relaxation. Indeterminate filling pressures No evidence of left  ventricular regional wall motion abnormalities.   2. The right ventricle has normal systolic function. The cavity was  normal. There is no increase in right ventricular wall thickness.   3. The tricuspid valve is grossly normal.   4. The aortic valve is tricuspid Aortic valve regurgitation is mild by  color flow Doppler.   5. There is mild dilatation of the aortic root measuring 38 mm.   6. The interatrial septum was not well visualized.      04/2019 nuclear stress There was no ST segment deviation noted during stress. Findings consistent with prior inferior/inferoseptal/inferoapical myocardial infarction. No current ischemia This is a low risk study. The left ventricular ejection fraction is normal (55-65%).   11/2020 echo  1. Left ventricular  ejection fraction, by estimation, is 55 to 60%. The  left ventricle has normal function. The left ventricle has no regional  wall motion abnormalities. There is mild  left ventricular hypertrophy.  Left ventricular diastolic parameters  are consistent with Grade I diastolic dysfunction (impaired relaxation).  Elevated left atrial pressure.   2. Right ventricular systolic function is normal. The right ventricular  size is normal.   3. The pericardial effusion is circumferential.   4. The mitral valve is normal in structure. No evidence of mitral valve  regurgitation. No evidence of mitral stenosis.   5. The aortic valve is tricuspid. Aortic valve regurgitation is mild. No  aortic stenosis is present.   6. The inferior vena cava is normal in size with greater than 50%  respiratory variability, suggesting right atrial pressure of 3 mmHg.     11/2020 nuclear stress There was no ST segment deviation noted during stress. Findings consistent with prior inferior/inferoseptal myocardial infarction. There is a small apical infarct with mild to moderate peri-infarct ischemia. The left ventricular ejection fraction is normal (55-65%). Low to intermediate risk study.    Assessment and Plan  1. Chest pain - long history of chest pain symptoms with negative workups in the past including cath, multiple stress tests  - most recent stress test low to intermediate risk, working with medical therapy to manage symptoms -no recent symptoms - with some ongoing orthostatic symptoms lower imdur to 15mg  daily     2. Afib/acquired thrombophilia -no recent symptoms, continue current meds including eliquis for stroke prevention - EKG today shows apaced V sensed   3.Pacemaker - normal recent device check, contniue to monitor  4. Hyperlipidemia - LDL at goal, continue current meds      Antoine Poche, M.D.

## 2023-01-07 ENCOUNTER — Other Ambulatory Visit: Payer: Self-pay

## 2023-01-07 DIAGNOSIS — E538 Deficiency of other specified B group vitamins: Secondary | ICD-10-CM

## 2023-01-28 ENCOUNTER — Encounter: Payer: Medicare Other | Admitting: Cardiovascular Disease

## 2023-01-28 ENCOUNTER — Encounter: Payer: Medicare Other | Admitting: Internal Medicine

## 2023-02-01 DIAGNOSIS — H43823 Vitreomacular adhesion, bilateral: Secondary | ICD-10-CM | POA: Diagnosis not present

## 2023-02-01 DIAGNOSIS — H35362 Drusen (degenerative) of macula, left eye: Secondary | ICD-10-CM | POA: Diagnosis not present

## 2023-02-01 DIAGNOSIS — H3561 Retinal hemorrhage, right eye: Secondary | ICD-10-CM | POA: Diagnosis not present

## 2023-02-01 DIAGNOSIS — H4311 Vitreous hemorrhage, right eye: Secondary | ICD-10-CM | POA: Diagnosis not present

## 2023-02-01 DIAGNOSIS — H353211 Exudative age-related macular degeneration, right eye, with active choroidal neovascularization: Secondary | ICD-10-CM | POA: Diagnosis not present

## 2023-02-01 DIAGNOSIS — H43813 Vitreous degeneration, bilateral: Secondary | ICD-10-CM | POA: Diagnosis not present

## 2023-02-01 DIAGNOSIS — H2513 Age-related nuclear cataract, bilateral: Secondary | ICD-10-CM | POA: Diagnosis not present

## 2023-02-04 ENCOUNTER — Telehealth: Payer: Self-pay | Admitting: Cardiology

## 2023-02-04 NOTE — Telephone Encounter (Signed)
   Pre-operative Risk Assessment    Patient Name: [name]  DOB: Jun 28, 1946 MRN: [id number]      Request for Surgical Clearance    Procedure:   vITRECTOMY , Possible endolaser and intravitreal injection of Eylea  Date of Surgery:           02/17/2023                Surgeon:  Stephannie Li, M.D.  Surgeon's Group or Practice Name:  [facility]  Phone number:  [phone] Fax number:  [phone]   Type of Clearance Requested:   - Pharmacy:  Hold Aspirin and Warfarin (Coumadin) REQUESTING TO HOLD ELIQUIS 5 DAYS PRIOR TO SURGERY    Type of Anesthesia:  MAC   Additional requests/questions:    Sallyanne Havers   02/04/2023, 3:42 PM

## 2023-02-04 NOTE — Telephone Encounter (Signed)
Patient with diagnosis of afib on Eliquis for anticoagulation.    Procedure: vitrectomy, possible endolaser and intravitreal injection  Date of procedure: 02/17/23  CHA2DS2-VASc Score = 7  This indicates a 11.2% annual risk of stroke. The patient's score is based upon: CHF History: 0 HTN History: 1 Diabetes History: 1 Stroke History: 2 Vascular Disease History: 1 Age Score: 2 Gender Score: 0  Afib dx 10/2018 admission. Stroke occurred 03/2016.  CrCl 20mL/min Platelet count 190K  Clearance request mentions both warfarin and Eliquis. Pt is on Eliquis which requires shorter hold time prior to procedures due to faster elimination. Recommend pt hold Eliquis for 1-2 days prior to procedure and resume as soon as safely possible after given elevated CV risk.  **This guidance is not considered finalized until pre-operative APP has relayed final recommendations.**

## 2023-02-07 NOTE — Telephone Encounter (Signed)
   Patient Name: [name]  DOB: [birth year] MRN: [id number]  Primary Cardiologist: Dina Rich, MD  Clinical pharmacists have reviewed the patient's past medical history, labs, and current medications as part of preoperative protocol coverage. The following recommendations have been made:   Patient with diagnosis of afib on Eliquis for anticoagulation.     Procedure: vitrectomy, possible endolaser and intravitreal injection  Date of procedure: 02/17/23   CHA2DS2-VASc Score = 7  This indicates a 11.2% annual risk of stroke. The patient's score is based upon: CHF History: 0 HTN History: 1 Diabetes History: 1 Stroke History: 2 Vascular Disease History: 1 Age Score: 2 Gender Score: 0   Afib dx 10/2018 admission. Stroke occurred 03/2016.   CrCl 36mL/min Platelet count 190K   Clearance request mentions both warfarin and Eliquis. Pt is on Eliquis which requires shorter hold time prior to procedures due to faster elimination. Recommend pt hold Eliquis for 1-2 days prior to procedure and resume as soon as safely possible after given elevated CV risk. Please resume Eliquis as soon as possible postprocedure, at the discretion of the surgeon.    I will route this recommendation to the requesting party via Epic fax function and remove from pre-op pool.  Please call with questions.  Joylene Grapes, NP 02/07/2023, 11:19 AM

## 2023-02-09 DIAGNOSIS — E1165 Type 2 diabetes mellitus with hyperglycemia: Secondary | ICD-10-CM | POA: Diagnosis not present

## 2023-02-09 DIAGNOSIS — E039 Hypothyroidism, unspecified: Secondary | ICD-10-CM | POA: Diagnosis not present

## 2023-02-09 DIAGNOSIS — I129 Hypertensive chronic kidney disease with stage 1 through stage 4 chronic kidney disease, or unspecified chronic kidney disease: Secondary | ICD-10-CM | POA: Diagnosis not present

## 2023-02-15 DIAGNOSIS — E1165 Type 2 diabetes mellitus with hyperglycemia: Secondary | ICD-10-CM | POA: Diagnosis not present

## 2023-02-15 DIAGNOSIS — R600 Localized edema: Secondary | ICD-10-CM | POA: Diagnosis not present

## 2023-02-15 DIAGNOSIS — E875 Hyperkalemia: Secondary | ICD-10-CM | POA: Diagnosis not present

## 2023-02-15 DIAGNOSIS — K219 Gastro-esophageal reflux disease without esophagitis: Secondary | ICD-10-CM | POA: Diagnosis not present

## 2023-02-15 DIAGNOSIS — G894 Chronic pain syndrome: Secondary | ICD-10-CM | POA: Diagnosis not present

## 2023-02-15 DIAGNOSIS — I129 Hypertensive chronic kidney disease with stage 1 through stage 4 chronic kidney disease, or unspecified chronic kidney disease: Secondary | ICD-10-CM | POA: Diagnosis not present

## 2023-02-15 DIAGNOSIS — E039 Hypothyroidism, unspecified: Secondary | ICD-10-CM | POA: Diagnosis not present

## 2023-02-15 DIAGNOSIS — E782 Mixed hyperlipidemia: Secondary | ICD-10-CM | POA: Diagnosis not present

## 2023-02-15 DIAGNOSIS — D696 Thrombocytopenia, unspecified: Secondary | ICD-10-CM | POA: Diagnosis not present

## 2023-02-15 DIAGNOSIS — E1142 Type 2 diabetes mellitus with diabetic polyneuropathy: Secondary | ICD-10-CM | POA: Diagnosis not present

## 2023-02-15 DIAGNOSIS — N1832 Chronic kidney disease, stage 3b: Secondary | ICD-10-CM | POA: Diagnosis not present

## 2023-02-17 ENCOUNTER — Ambulatory Visit (INDEPENDENT_AMBULATORY_CARE_PROVIDER_SITE_OTHER): Payer: 59

## 2023-02-17 DIAGNOSIS — H33311 Horseshoe tear of retina without detachment, right eye: Secondary | ICD-10-CM | POA: Diagnosis not present

## 2023-02-17 DIAGNOSIS — I495 Sick sinus syndrome: Secondary | ICD-10-CM

## 2023-02-17 DIAGNOSIS — H353211 Exudative age-related macular degeneration, right eye, with active choroidal neovascularization: Secondary | ICD-10-CM | POA: Diagnosis not present

## 2023-02-17 DIAGNOSIS — H3561 Retinal hemorrhage, right eye: Secondary | ICD-10-CM | POA: Diagnosis not present

## 2023-02-17 DIAGNOSIS — H33301 Unspecified retinal break, right eye: Secondary | ICD-10-CM | POA: Diagnosis not present

## 2023-02-17 DIAGNOSIS — H4311 Vitreous hemorrhage, right eye: Secondary | ICD-10-CM | POA: Diagnosis not present

## 2023-02-25 ENCOUNTER — Encounter: Payer: Self-pay | Admitting: Cardiovascular Disease

## 2023-02-25 ENCOUNTER — Ambulatory Visit: Payer: 59 | Attending: Internal Medicine | Admitting: Cardiovascular Disease

## 2023-02-25 ENCOUNTER — Other Ambulatory Visit: Payer: Self-pay

## 2023-02-25 DIAGNOSIS — I480 Paroxysmal atrial fibrillation: Secondary | ICD-10-CM | POA: Diagnosis not present

## 2023-02-25 DIAGNOSIS — E538 Deficiency of other specified B group vitamins: Secondary | ICD-10-CM

## 2023-02-25 NOTE — Patient Instructions (Signed)

## 2023-02-25 NOTE — Progress Notes (Signed)
    PCP: Benita Stabile, MD Primary Cardiologist: Dr Wyline Mood Primary EP:  Dr Nelly Laurence  [name] is a 77 y.o. male who presents today for routine electrophysiology followup.      He has a Presenter, broadcasting pacemaker placed in November 2020 by Dr. Johney Frame for sinus bradycardia.  Prior to the pacemaker placement he was having frequent falls.  He has chronic stable chest pain.  He recently saw Dr Wyline Mood for this.  He has had an extensive workup over the years.        Today, he denies symptoms of palpitations,  shortness of breath,  lower extremity edema, dizziness, presyncope, or syncope. He hasn't had any issues with chest pain in awhile.  he has no device related complaints -- no new tenderness, drainage, redness.The patient is otherwise without complaint today.     Physical Exam: Vitals:   02/25/23 1053  BP: 122/60  Pulse: 67  SpO2: 96%  Weight: 177 lb 6.4 oz (80.5 kg)  Height: 5\' 8"  (1.727 m)    Gen: Appears comfortable, well-nourished CV: RRR, no dependent edema The device site is normal -- no tenderness, edema, drainage, redness, threatened erosion. Pulm: breathing easily   Pacemaker interrogation- reviewed in detail today,  See PACEART report    Assessment and Plan:  1. Symptomatic sinus bradycardia with post termination pauses Normal pacemaker function See Pace Art report No changes today he is not device dependant today  2. Paroxysmal atrial fibrillation asymptomatic Afib burden is <1 % (previously 12%, prior 6.2%) On eliquis 5  3. HTN Stable No change required today   4. Chest pain Dr Verna Czech 01/03/2023 note reviewed This is a very chronic issue but he hasn't had any issues recently   Return for further device follow-up in a year  Maurice Small, MD 02/25/2023 11:23 AM

## 2023-02-28 LAB — CUP PACEART REMOTE DEVICE CHECK
Battery Remaining Longevity: 93 mo
Battery Remaining Percentage: 75 %
Battery Voltage: 2.99 V
Brady Statistic AP VP Percent: 1 %
Brady Statistic AP VS Percent: 16 %
Brady Statistic AS VP Percent: 1 %
Brady Statistic AS VS Percent: 84 %
Brady Statistic RA Percent Paced: 16 %
Brady Statistic RV Percent Paced: 1 %
Date Time Interrogation Session: 20240627030631
Implantable Lead Connection Status: 753985
Implantable Lead Connection Status: 753985
Implantable Lead Implant Date: 20201103
Implantable Lead Implant Date: 20201103
Implantable Lead Location: 753859
Implantable Lead Location: 753860
Implantable Pulse Generator Implant Date: 20201103
Lead Channel Impedance Value: 410 Ohm
Lead Channel Impedance Value: 490 Ohm
Lead Channel Pacing Threshold Amplitude: 0.5 V
Lead Channel Pacing Threshold Amplitude: 0.75 V
Lead Channel Pacing Threshold Pulse Width: 0.5 ms
Lead Channel Pacing Threshold Pulse Width: 0.5 ms
Lead Channel Sensing Intrinsic Amplitude: 12 mV
Lead Channel Sensing Intrinsic Amplitude: 2.1 mV
Lead Channel Setting Pacing Amplitude: 2 V
Lead Channel Setting Pacing Amplitude: 2.5 V
Lead Channel Setting Pacing Pulse Width: 0.5 ms
Lead Channel Setting Sensing Sensitivity: 2 mV
Pulse Gen Model: 2272
Pulse Gen Serial Number: 9174806

## 2023-03-01 DIAGNOSIS — Z9889 Other specified postprocedural states: Secondary | ICD-10-CM | POA: Diagnosis not present

## 2023-03-08 NOTE — Progress Notes (Signed)
 Remote pacemaker transmission.   

## 2023-03-09 ENCOUNTER — Other Ambulatory Visit (HOSPITAL_COMMUNITY)
Admission: RE | Admit: 2023-03-09 | Discharge: 2023-03-09 | Disposition: A | Discharge status: Home / Self Care | Payer: 59 | Source: Ambulatory Visit | Attending: Gastroenterology | Admitting: Gastroenterology

## 2023-03-09 DIAGNOSIS — E538 Deficiency of other specified B group vitamins: Secondary | ICD-10-CM | POA: Insufficient documentation

## 2023-03-09 LAB — VITAMIN B12: Vitamin B-12: 114 pg/mL — ABNORMAL LOW (ref 180–914)

## 2023-03-20 ENCOUNTER — Other Ambulatory Visit: Payer: Self-pay | Admitting: Gastroenterology

## 2023-03-20 MED ORDER — CYANOCOBALAMIN 1000 MCG/ML IJ SOLN: INTRAMUSCULAR | 0 refills | Status: DC

## 2023-03-20 MED ORDER — CYANOCOBALAMIN 1000 MCG/ML IJ SOLN: 1000.0000 ug | INTRAMUSCULAR | 3 refills | Status: DC

## 2023-03-21 ENCOUNTER — Other Ambulatory Visit: Payer: Self-pay

## 2023-03-21 DIAGNOSIS — E538 Deficiency of other specified B group vitamins: Secondary | ICD-10-CM

## 2023-03-22 DIAGNOSIS — H4311 Vitreous hemorrhage, right eye: Secondary | ICD-10-CM | POA: Diagnosis not present

## 2023-03-22 DIAGNOSIS — H33311 Horseshoe tear of retina without detachment, right eye: Secondary | ICD-10-CM | POA: Diagnosis not present

## 2023-05-01 ENCOUNTER — Observation Stay (HOSPITAL_COMMUNITY)
Admission: EM | Admit: 2023-05-01 | Discharge: 2023-05-02 | Disposition: A | Discharge status: Home Health | Payer: 59 | Attending: Family Medicine | Admitting: Family Medicine

## 2023-05-01 ENCOUNTER — Encounter (HOSPITAL_COMMUNITY): Payer: Self-pay

## 2023-05-01 ENCOUNTER — Emergency Department (HOSPITAL_COMMUNITY): Payer: 59

## 2023-05-01 DIAGNOSIS — I495 Sick sinus syndrome: Secondary | ICD-10-CM | POA: Insufficient documentation

## 2023-05-01 DIAGNOSIS — Z7901 Long term (current) use of anticoagulants: Secondary | ICD-10-CM | POA: Insufficient documentation

## 2023-05-01 DIAGNOSIS — Z950 Presence of cardiac pacemaker: Secondary | ICD-10-CM | POA: Insufficient documentation

## 2023-05-01 DIAGNOSIS — R270 Ataxia, unspecified: Secondary | ICD-10-CM | POA: Diagnosis not present

## 2023-05-01 DIAGNOSIS — I129 Hypertensive chronic kidney disease with stage 1 through stage 4 chronic kidney disease, or unspecified chronic kidney disease: Secondary | ICD-10-CM | POA: Insufficient documentation

## 2023-05-01 DIAGNOSIS — H579 Unspecified disorder of eye and adnexa: Secondary | ICD-10-CM | POA: Diagnosis not present

## 2023-05-01 DIAGNOSIS — G9341 Metabolic encephalopathy: Secondary | ICD-10-CM | POA: Diagnosis not present

## 2023-05-01 DIAGNOSIS — K219 Gastro-esophageal reflux disease without esophagitis: Secondary | ICD-10-CM

## 2023-05-01 DIAGNOSIS — Z79899 Other long term (current) drug therapy: Secondary | ICD-10-CM | POA: Diagnosis not present

## 2023-05-01 DIAGNOSIS — E782 Mixed hyperlipidemia: Secondary | ICD-10-CM | POA: Diagnosis not present

## 2023-05-01 DIAGNOSIS — I10 Essential (primary) hypertension: Secondary | ICD-10-CM | POA: Diagnosis present

## 2023-05-01 DIAGNOSIS — Z87891 Personal history of nicotine dependence: Secondary | ICD-10-CM | POA: Diagnosis not present

## 2023-05-01 DIAGNOSIS — R770 Abnormality of albumin: Secondary | ICD-10-CM | POA: Insufficient documentation

## 2023-05-01 DIAGNOSIS — I480 Paroxysmal atrial fibrillation: Secondary | ICD-10-CM | POA: Diagnosis not present

## 2023-05-01 DIAGNOSIS — Z1152 Encounter for screening for COVID-19: Secondary | ICD-10-CM | POA: Insufficient documentation

## 2023-05-01 DIAGNOSIS — E46 Unspecified protein-calorie malnutrition: Secondary | ICD-10-CM | POA: Insufficient documentation

## 2023-05-01 DIAGNOSIS — R531 Weakness: Principal | ICD-10-CM

## 2023-05-01 DIAGNOSIS — R296 Repeated falls: Secondary | ICD-10-CM

## 2023-05-01 DIAGNOSIS — E8809 Other disorders of plasma-protein metabolism, not elsewhere classified: Secondary | ICD-10-CM | POA: Insufficient documentation

## 2023-05-01 DIAGNOSIS — N1831 Chronic kidney disease, stage 3a: Secondary | ICD-10-CM | POA: Insufficient documentation

## 2023-05-01 DIAGNOSIS — E1122 Type 2 diabetes mellitus with diabetic chronic kidney disease: Secondary | ICD-10-CM | POA: Diagnosis not present

## 2023-05-01 DIAGNOSIS — E039 Hypothyroidism, unspecified: Secondary | ICD-10-CM | POA: Diagnosis not present

## 2023-05-01 DIAGNOSIS — R6889 Other general symptoms and signs: Secondary | ICD-10-CM | POA: Diagnosis not present

## 2023-05-01 DIAGNOSIS — R4182 Altered mental status, unspecified: Principal | ICD-10-CM | POA: Diagnosis present

## 2023-05-01 DIAGNOSIS — R5381 Other malaise: Secondary | ICD-10-CM | POA: Diagnosis not present

## 2023-05-01 DIAGNOSIS — R260 Ataxic gait: Secondary | ICD-10-CM | POA: Diagnosis not present

## 2023-05-01 DIAGNOSIS — E860 Dehydration: Secondary | ICD-10-CM | POA: Insufficient documentation

## 2023-05-01 DIAGNOSIS — D696 Thrombocytopenia, unspecified: Secondary | ICD-10-CM | POA: Diagnosis present

## 2023-05-01 DIAGNOSIS — E119 Type 2 diabetes mellitus without complications: Secondary | ICD-10-CM

## 2023-05-01 DIAGNOSIS — I2510 Atherosclerotic heart disease of native coronary artery without angina pectoris: Secondary | ICD-10-CM | POA: Insufficient documentation

## 2023-05-01 DIAGNOSIS — Z743 Need for continuous supervision: Secondary | ICD-10-CM | POA: Diagnosis not present

## 2023-05-01 LAB — CBC WITH DIFFERENTIAL/PLATELET
Abs Immature Granulocytes: 0.01 10*3/uL (ref 0.00–0.07)
Basophils Absolute: 0 10*3/uL (ref 0.0–0.1)
Basophils Relative: 0 %
Eosinophils Absolute: 0.3 10*3/uL (ref 0.0–0.5)
Eosinophils Relative: 4 %
HCT: 38.2 % — ABNORMAL LOW (ref 39.0–52.0)
Hemoglobin: 12.3 g/dL — ABNORMAL LOW (ref 13.0–17.0)
Immature Granulocytes: 0 %
Lymphocytes Relative: 22 %
Lymphs Abs: 1.7 10*3/uL (ref 0.7–4.0)
MCH: 28.4 pg (ref 26.0–34.0)
MCHC: 32.2 g/dL (ref 30.0–36.0)
MCV: 88.2 fL (ref 80.0–100.0)
Monocytes Absolute: 0.7 10*3/uL (ref 0.1–1.0)
Monocytes Relative: 9 %
Neutro Abs: 4.8 10*3/uL (ref 1.7–7.7)
Neutrophils Relative %: 65 %
Platelets: 134 10*3/uL — ABNORMAL LOW (ref 150–400)
RBC: 4.33 MIL/uL (ref 4.22–5.81)
RDW: 14.1 % (ref 11.5–15.5)
WBC: 7.5 10*3/uL (ref 4.0–10.5)
nRBC: 0 % (ref 0.0–0.2)

## 2023-05-01 LAB — LACTIC ACID, PLASMA
Lactic Acid, Venous: 0.8 mmol/L (ref 0.5–1.9)
Lactic Acid, Venous: 0.8 mmol/L (ref 0.5–1.9)

## 2023-05-01 LAB — COMPREHENSIVE METABOLIC PANEL
ALT: 19 U/L (ref 0–44)
AST: 17 U/L (ref 15–41)
Albumin: 3.3 g/dL — ABNORMAL LOW (ref 3.5–5.0)
Alkaline Phosphatase: 86 U/L (ref 38–126)
Anion gap: 6 (ref 5–15)
BUN: 16 mg/dL (ref 8–23)
CO2: 23 mmol/L (ref 22–32)
Calcium: 8.3 mg/dL — ABNORMAL LOW (ref 8.9–10.3)
Chloride: 105 mmol/L (ref 98–111)
Creatinine, Ser: 1.53 mg/dL — ABNORMAL HIGH (ref 0.61–1.24)
GFR, Estimated: 47 mL/min — ABNORMAL LOW (ref >60)
Glucose, Bld: 104 mg/dL — ABNORMAL HIGH (ref 70–99)
Potassium: 3.8 mmol/L (ref 3.5–5.1)
Sodium: 134 mmol/L — ABNORMAL LOW (ref 135–145)
Total Bilirubin: 0.7 mg/dL (ref 0.3–1.2)
Total Protein: 6.4 g/dL — ABNORMAL LOW (ref 6.5–8.1)

## 2023-05-01 LAB — CBG MONITORING, ED: Glucose-Capillary: 121 mg/dL — ABNORMAL HIGH (ref 70–99)

## 2023-05-01 LAB — URINALYSIS, ROUTINE W REFLEX MICROSCOPIC
Bacteria, UA: NONE SEEN
Bilirubin Urine: NEGATIVE
Glucose, UA: >=500 mg/dL — AB
Hgb urine dipstick: NEGATIVE
Ketones, ur: NEGATIVE mg/dL
Leukocytes,Ua: NEGATIVE
Nitrite: NEGATIVE
Protein, ur: NEGATIVE mg/dL
Specific Gravity, Urine: 1.006 (ref 1.005–1.030)
pH: 6 (ref 5.0–8.0)

## 2023-05-01 LAB — TROPONIN I (HIGH SENSITIVITY): Troponin I (High Sensitivity): 4 ng/L (ref <18)

## 2023-05-01 LAB — TSH: TSH: 0.463 u[IU]/mL (ref 0.350–4.500)

## 2023-05-01 LAB — SARS CORONAVIRUS 2 BY RT PCR: SARS Coronavirus 2 by RT PCR: NEGATIVE

## 2023-05-01 LAB — MAGNESIUM: Magnesium: 2 mg/dL (ref 1.7–2.4)

## 2023-05-01 MED ORDER — PROCHLORPERAZINE EDISYLATE 10 MG/2ML IJ SOLN
10.0000 mg | Freq: Once | INTRAMUSCULAR | Status: AC
  Administered 2023-05-01: 10 mg via INTRAVENOUS
  Filled 2023-05-01: qty 2

## 2023-05-01 MED ORDER — KETOROLAC TROMETHAMINE 30 MG/ML IJ SOLN
30.0000 mg | Freq: Once | INTRAMUSCULAR | Status: AC
  Administered 2023-05-01: 30 mg via INTRAVENOUS
  Filled 2023-05-01: qty 1

## 2023-05-01 MED ORDER — DIPHENHYDRAMINE HCL 50 MG/ML IJ SOLN
25.0000 mg | Freq: Once | INTRAMUSCULAR | Status: AC
  Administered 2023-05-01: 25 mg via INTRAVENOUS
  Filled 2023-05-01: qty 1

## 2023-05-01 MED ORDER — SODIUM CHLORIDE 0.9 % IV SOLN: INTRAVENOUS | Status: DC

## 2023-05-01 NOTE — ED Provider Notes (Signed)
[place] EMERGENCY DEPARTMENT AT [facility] Provider Note   CSN: [id number] Arrival date & time: 05/01/23  4098     History  Chief Complaint  Patient presents with   Fatigue    [name] is a 77 y.o. male.  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  He takes Eliquis for paroxysmal atrial fibrillation, he does have a history of hypothyroidism.  The patient presents with a feeling of lethargy and fatigue which is started today, yesterday he was feeling well enough that he was able to jump on the trampoline with a great grandchild albeit very gently.  Today he has had some progressive generalized weakness, there was no fevers, no vomiting or diarrhea, he has had a bit of a cough, denies sore throat, he does endorse having a dry mouth.  He had a normal blood sugar prehospital, symptoms are fairly dense this evening.    Home Medications Prior to Admission medications   Medication Sig Start Date End Date Taking? Authorizing Provider  ACCU-CHEK GUIDE test strip USE TO TEST TWICE DAILY 08/24/21   [provider]  acetaminophen (TYLENOL) 500 MG tablet Take 500 mg by mouth every 8 (eight) hours as needed for moderate pain or headache.    [provider]  albuterol (VENTOLIN HFA) 108 (90 Base) MCG/ACT inhaler Inhale 1 puff into the lungs every 6 (six) hours as needed for wheezing.    [provider]  ALPRAZolam Prudy Feeler) 1 MG tablet Take 1 mg by mouth 3 (three) times daily. 01/18/16   [provider]  apixaban (ELIQUIS) 5 MG TABS tablet TAKE 1 TABLET BY MOUTH TWICE DAILY 11/30/22   Antoine Poche, MD  atorvastatin (LIPITOR) 40 MG tablet Take 40 mg by mouth daily. 10/20/21   [provider]  cyanocobalamin (VITAMIN B12) 1000 MCG/ML injection Inject 1mL  ( ) subcutaneous into skin once weekly for four weeks. Then fill monthly RX. 03/20/23   Tiffany Kocher, PA-C  cyanocobalamin (VITAMIN B12) 1000 MCG/ML injection Inject 1 mL (1,000 mcg total) into the skin every 30 (thirty) days. Start after you finish weekly dosing 03/20/23   Tiffany Kocher, PA-C  DULoxetine (CYMBALTA) 30 MG capsule Take 30 mg by mouth daily. 04/06/22   [provider]  FARXIGA 10 MG TABS tablet Take 10 mg by mouth daily. 10/20/21   [provider]  gabapentin (NEURONTIN) 300 MG capsule Take 300 mg by mouth 3 (three) times daily. 12/17/17   [provider]  glimepiride (AMARYL) 2 MG tablet Take 2 mg by mouth daily. 01/29/21   [provider]  isosorbide mononitrate (IMDUR) 30 MG 24 hr tablet Take 0.5 tablets (15 mg total) by mouth daily. 01/03/23   Antoine Poche, MD  levothyroxine (SYNTHROID) 137 MCG tablet Take 137 mcg by mouth daily. 10/20/21   [provider]  lisinopril (ZESTRIL) 2.5 MG tablet TAKE 1 TABLET BY MOUTH DAILY 12/27/22   Antoine Poche, MD  nitroGLYCERIN (NITROSTAT) 0.4 MG SL tablet Place 0.4 mg under the tongue every 5 (five) minutes as needed for chest pain.  03/08/16   [provider]  omega-3 acid ethyl esters (LOVAZA) 1 g capsule Take 2 g by mouth 2 (two) times daily. 12/17/17   [provider]  omeprazole (PRILOSEC) 40 MG capsule  Take 40 mg by mouth daily before breakfast. 10/20/21   [provider]  oxyCODONE-acetaminophen (PERCOCET) 10-325 MG tablet Take 1 tablet by mouth every 4 (four) hours as needed. 01/25/23   [provider]  potassium chloride (MICRO-K) 10 MEQ CR capsule Take 10 mEq by mouth daily. 10/20/21   [provider]      Allergies    Morphine, Tape, and Sulfonamide derivatives    Review of Systems   Review of Systems  All other systems reviewed and are negative.   Physical Exam Updated Vital Signs BP 124/66   Pulse 66   Temp (!) 97.5 F (36.4  C) (Oral)   Resp 17   Ht 1.727 m (5\' 8" )   Wt 81 kg   SpO2 95%   BMI 27.15 kg/m  Physical Exam Vitals and nursing note reviewed.  Constitutional:      General: He is not in acute distress.    Appearance: He is well-developed.  HENT:     Head: Normocephalic and atraumatic.     Mouth/Throat:     Mouth: Mucous membranes are dry.     Pharynx: No oropharyngeal exudate.  Eyes:     General: No scleral icterus.       Right eye: No discharge.        Left eye: No discharge.     Conjunctiva/sclera: Conjunctivae normal.     Pupils: Pupils are equal, round, and reactive to light.  Neck:     Thyroid: No thyromegaly.     Vascular: No JVD.  Cardiovascular:     Rate and Rhythm: Normal rate and regular rhythm.     Heart sounds: Normal heart sounds. No murmur heard.    No friction rub. No gallop.  Pulmonary:     Effort: Pulmonary effort is normal. No respiratory distress.     Breath sounds: Rales present. No wheezing.     Comments: Subtle rales at the bases that clear with deep breathing Abdominal:     General: Bowel sounds are normal. There is distension.     Palpations: Abdomen is soft. There is no mass.     Tenderness: There is no abdominal tenderness.     Comments: Distended nontender abdomen with diffuse to phonetic sounds to percussion but normal bowel sounds.  No tenderness  Musculoskeletal:        General: No tenderness. Normal range of motion.     Cervical back: Normal range of motion and neck supple.     Right lower leg: No edema.     Left lower leg: No edema.  Lymphadenopathy:     Cervical: No cervical adenopathy.  Skin:    General: Skin is warm and dry.     Findings: No erythema or rash.  Neurological:     Mental Status: He is alert.     Coordination: Coordination normal.     Comments: Patient is able to follow commands without difficulty, he is able to sit up in bed by himself with at very very slow movement, his answers are a little bit slow coming out but he is able  to move all 4 extremities with normal strength and coordination.  Cranial nerves III through XII appear normal  Psychiatric:        Behavior: Behavior normal.     ED Results / Procedures / Treatments   Labs (all labs ordered are listed, but only abnormal results are displayed) Labs Reviewed  URINALYSIS, ROUTINE W REFLEX MICROSCOPIC - Abnormal; Notable for  the following components:      Result Value   Color, Urine STRAW (*)    Glucose, UA >=500 (*)    All other components within normal limits  CBC WITH DIFFERENTIAL/PLATELET - Abnormal; Notable for the following components:   Hemoglobin 12.3 (*)    HCT 38.2 (*)    Platelets 134 (*)    All other components within normal limits  COMPREHENSIVE METABOLIC PANEL - Abnormal; Notable for the following components:   Sodium 134 (*)    Glucose, Bld 104 (*)    Creatinine, Ser 1.53 (*)    Calcium 8.3 (*)    Total Protein 6.4 (*)    Albumin 3.3 (*)    GFR, Estimated 47 (*)    All other components within normal limits  CBG MONITORING, ED - Abnormal; Notable for the following components:   Glucose-Capillary 121 (*)    All other components within normal limits  SARS CORONAVIRUS 2 BY RT PCR  TSH  LACTIC ACID, PLASMA  LACTIC ACID, PLASMA  MAGNESIUM  HEMOGLOBIN A1C  TROPONIN I (HIGH SENSITIVITY)    EKG EKG Interpretation Date/Time:  Sunday May 01 2023 19:18:24 EDT Ventricular Rate:  67 PR Interval:  222 QRS Duration:  106 QT Interval:  388 QTC Calculation: 410 R Axis:   -41  Text Interpretation: Sinus rhythm Prolonged PR interval Left axis deviation Anteroseptal infarct, age indeterminate Confirmed by Eber Hong ([id number]) on 05/01/2023 8:20:36 PM  Radiology CT Head Wo Contrast  Result Date: 05/01/2023 CLINICAL DATA:  Head injury, weakness, lethargy EXAM: CT HEAD WITHOUT CONTRAST TECHNIQUE: Contiguous axial images were obtained from the base of the skull through the vertex without intravenous contrast. RADIATION DOSE REDUCTION:  This exam was performed according to the departmental dose-optimization program which includes automated exposure control, adjustment of the mA and/or kV according to patient size and/or use of iterative reconstruction technique. COMPARISON:  10/28/2021 FINDINGS: Brain: No evidence of acute infarction, hemorrhage, hydrocephalus, extra-axial collection or mass lesion/mass effect. Tiny remote lacunar infarct noted within the right cerebellar hemisphere. Vascular: No hyperdense vessel or unexpected calcification. Skull: Normal. Negative for fracture or focal lesion. Sinuses/Orbits: No acute finding. Other: Mastoid air cells and middle ear cavities are clear. IMPRESSION: 1. No acute intracranial abnormality. No calvarial fracture. 2. Tiny remote lacunar infarct within the right cerebellar hemisphere. Electronically Signed   By: Helyn Numbers M.D.   On: 05/01/2023 20:07    Procedures Procedures    Medications Ordered in ED Medications  0.9 %  sodium chloride infusion ( Intravenous New Bag/Given 05/01/23 2230)  prochlorperazine (COMPAZINE) injection 10 mg (10 mg Intravenous Given 05/01/23 2231)  diphenhydrAMINE (BENADRYL) injection 25 mg (25 mg Intravenous Given 05/01/23 2230)  ketorolac (TORADOL) 30 MG/ML injection 30 mg (30 mg Intravenous Given 05/01/23 2231)    ED Course/ Medical Decision Making/ A&P                                 Medical Decision Making Amount and/or Complexity of Data Reviewed Labs: ordered. Radiology: ordered. ECG/medicine tests: ordered.  Risk Prescription drug management. Decision regarding hospitalization.    This patient presents to the ED for concern of weakness and fatigue, this involves an extensive number of treatment options, and is a complaint that carries with it a high risk of complications and morbidity.  The differential diagnosis includes slight abnormalities, infection, COVID, stroke, intracranial hemorrhage, hypothyroidism, cardiac arrhythmia   Co  morbidities  that complicate the patient evaluation  As above the patient is diabetic on anticoagulants   Additional history obtained:  Additional history obtained from medical record External records from outside source obtained and reviewed including cardiology notes   Lab Tests:  I Ordered, and personally interpreted labs.  The pertinent results include: No acute findings on lab workup including CBC metabolic panel urinalysis troponin and thyroid studies   Imaging Studies ordered:  I ordered imaging studies including CT scan of the brain I independently visualized and interpreted imaging which showed prior ischemic stroke but no hemorrhage or other abnormalities including masses or bleeding I agree with the radiologist interpretation   Cardiac Monitoring: / EKG:  The patient was maintained on a cardiac monitor.  I personally viewed and interpreted the cardiac monitored which showed an underlying rhythm of: Normal sinus rhythm   Consultations Obtained:  I requested consultation with the hospitalist Dr. Thomes Dinning,  and discussed lab and imaging findings as well as pertinent plan - they recommend: Admission, the patient does have a pacemaker so MRI is not necessarily a possibility for him   Problem List / ED Course / Critical interventions / Medication management  Patient was given IV fluids as well as a migraine cocktail with some improvement I ordered medication including Compazine, Toradol, fluids for headache Reevaluation of the patient after these medicines showed that the patient slight improvement but overall unchanged I have reviewed the patients home medicines and have made adjustments as needed   Social Determinants of Health:  Elderly, some confusion   Test / Admission - Considered:  Admit to the hospital.  He is not encephalopathic he is not meningitic he has no fever or leukocytosis, I do not think he needs an LP at this time.         Final Clinical  Impression(s) / ED Diagnoses Final diagnoses:  Weakness  Ataxia  Falls frequently    Rx / DC Orders ED Discharge Orders     None         Eber Hong, MD 05/01/23 2235

## 2023-05-01 NOTE — Discharge Instructions (Addendum)
1) physical therapy recommended to help you get stronger and reduce your risk for falling 2) follow-up with primary care physician within a week for recheck and reevaluation 3) repeat CBC and BMP blood test within a week advised 4)Avoid ibuprofen/Advil/Aleve/Motrin/Goody Powders/Naproxen/BC powders/Meloxicam/Diclofenac/Indomethacin and other Nonsteroidal anti-inflammatory medications as these will make you more likely to bleed and can cause stomach ulcers, can also cause Kidney problems.

## 2023-05-01 NOTE — H&P (Signed)
History and Physical    Patient: [name] WGN:[id number] DOB: 1946-05-23 DOA: 05/01/2023 DOS: the patient was seen and examined on 05/02/2023 PCP: Benita Stabile, MD  Patient coming from: Home  Chief Complaint:  Chief Complaint  Patient presents with   Fatigue   HPI: [name] is a 77 y.o. male with medical history significant of hypertension, paroxysmal atrial fibrillation on Eliquis, tachycardia-bradycardia syndrome with pacemaker, type 2 diabetes mellitus, hypothyroidism who presents to the emergency department from home via EMS due to change in mental status.  Patient was unable to provide a history due to being somnolent, though easily aroused.  Most of the history was obtained from ED physician and wife at bedside.  Per report, patient fell off the bed about 3 times overnight, this morning, he was lethargic throughout most of today starting in the morning, he was very tired and did not want to eat or drink anything.  Patient took home hydrocodone this morning (unsure how many he took).  He complained of headache which did not improve with Tylenol taken at home and endorsed generalized weakness.  Yesterday, he was at his baseline of functioning and was able to jump on a trampoline with a great-grandchild.  Patient denies chest pain, shortness of breath, fever, vomiting or diarrhea.  ED Course:  In the emergency department, temperature was 97.44F, other vital signs are within normal range.  Workup in the ED showed normocytic anemia and platelets of 134.  BMP was normal except for sodium of 134, blood glucose 104, creatinine 1.53 (baseline creatinine at 1.4-1.6), albumin 3.3.  TSH 0.463, lactic acid was normal, troponin x 1 was normal, magnesium 2.0, SARS coronavirus 2 was negative. CT head without contrast showed no acute intracranial abnormality.  No calvarial fracture. IV hydration was provided, Toradol, Benadryl and Compazine were given.  Hospitalist was asked to admit patient  for further evaluation and management.  Review of Systems: Review of systems as noted in the HPI. All other systems reviewed and are negative.   Past Medical History:  Diagnosis Date   Alcohol use    Anxiety    Chest pain    Coronary artery disease    a. Nonobstructive CAD by cath 2010 with negative nuclear stress test 05/2013.   Depression    Diabetes mellitus    Hyperlipidemia    Hypertension    Hypothyroidism    Kidney problem    a. 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   Mild aortic insufficiency    PAT (paroxysmal atrial tachycardia)    Pericardial effusion    a. by echo 2014.   Pulmonary nodules    resolved   PVC's (premature ventricular contractions)    Stroke (cerebrum) (HCC)    Stroke due to embolism of left carotid artery (HCC) 03/24/2016   Left caudate head stroke   Wide-complex tachycardia    a. per [name] note: Event monitor September 2014 sinus rhythm, occasional PVCs, nonsustained PAT, and one 7 beat run of a wide QRS complex at 110-120 bpm, with minimal changes QRS axis, and QRS morphology not similar to documented PVCs.    Past Surgical History:  Procedure Laterality Date   BACK SURGERY  1996   BREAST LUMPECTOMY Left 1979   CIRCUMCISION  1980   COLONOSCOPY  10/18/2014   Dr. Teena Dunk, tubular adenoma in the ascending and distal transverse colon, hyperplastic polyp in sigmoid colon.  Recommended 3-5-year repeat.   COLONOSCOPY WITH PROPOFOL N/A 03/26/2022   Surgeon: Eula Listen  M, MD; normal exam.  Repeat in 5 years due to personal history of polyps.   ESOPHAGOGASTRODUODENOSCOPY (EGD) WITH PROPOFOL N/A 03/26/2022   Surgeon: Corbin Ade, MD; Normal esophagus dilated, small hiatal hernia, normal stomach and examined duodenum.   EYE SURGERY     MALONEY DILATION N/A 03/26/2022   Procedure: Elease Hashimoto DILATION;  Surgeon: Corbin Ade, MD;  Location: [facility];  Service: Endoscopy;  Laterality: N/A;   MANDIBLE SURGERY Right 1969   PACEMAKER IMPLANT  N/A 06/26/2019   Procedure: PACEMAKER IMPLANT;  Surgeon: Hillis Range, MD;  Location: [name];  Service: Cardiovascular;  Laterality: N/A;    Social History:  reports that he quit smoking about 40 years ago. His smoking use included cigarettes. He has never used smokeless tobacco. He reports that he does not currently use alcohol. He reports that he does not currently use drugs after having used the following drugs: Marijuana.   Allergies  Allergen Reactions   Morphine Other (See Comments)    Affected the heart- was told to not take this again   Tape Other (See Comments)    Certain tapes TEAR THE SKIN   Sulfonamide Derivatives Rash and Other (See Comments)    Blistering on the skin    Family History  Problem Relation Age of Onset   Lung cancer Mother    Diabetes Father    Bladder Cancer Father    Colon cancer Brother        Age 50, passed at age 53   Stroke Maternal Grandmother    Cancer Maternal Grandfather    Stroke Paternal Grandmother    Stroke Paternal Grandfather    Colon cancer Maternal Aunt        70s   Colon cancer Cousin      Prior to Admission medications   Medication Sig Start Date End Date Taking? Authorizing Provider  ACCU-CHEK GUIDE test strip USE TO TEST TWICE DAILY 08/24/21   [provider]  acetaminophen (TYLENOL) 500 MG tablet Take 500 mg by mouth every 8 (eight) hours as needed for moderate pain or headache.    [provider]  albuterol (VENTOLIN HFA) 108 (90 Base) MCG/ACT inhaler Inhale 1 puff into the lungs every 6 (six) hours as needed for wheezing.    [provider]  ALPRAZolam Prudy Feeler) 1 MG tablet Take 1 mg by mouth 3 (three) times daily. 01/18/16   [provider]  apixaban (ELIQUIS) 5 MG TABS tablet TAKE 1 TABLET BY MOUTH TWICE DAILY 11/30/22   Antoine Poche, MD  atorvastatin (LIPITOR) 40 MG tablet Take 40 mg by mouth daily. 10/20/21   [provider]  cyanocobalamin (VITAMIN B12) 1000 MCG/ML  injection Inject 1mL ( ) subcutaneous into skin once weekly for four weeks. Then fill monthly RX. 03/20/23   Tiffany Kocher, PA-C  cyanocobalamin (VITAMIN B12) 1000 MCG/ML injection Inject 1 mL (1,000 mcg total) into the skin every 30 (thirty) days. Start after you finish weekly dosing 03/20/23   Tiffany Kocher, PA-C  DULoxetine (CYMBALTA) 30 MG capsule Take 30 mg by mouth daily. 04/06/22   [provider]  FARXIGA 10 MG TABS tablet Take 10 mg by mouth daily. 10/20/21   [provider]  gabapentin (NEURONTIN) 300 MG capsule Take 300 mg by mouth 3 (three) times daily. 12/17/17   [provider]  glimepiride (AMARYL) 2 MG tablet Take 2 mg by mouth daily. 01/29/21   [provider]  isosorbide mononitrate (IMDUR)  30 MG 24 hr tablet Take 0.5 tablets (15 mg total) by mouth daily. 01/03/23   Antoine Poche, MD  levothyroxine (SYNTHROID) 137 MCG tablet Take 137 mcg by mouth daily. 10/20/21   [provider]  lisinopril (ZESTRIL) 2.5 MG tablet TAKE 1 TABLET BY MOUTH DAILY 12/27/22   Antoine Poche, MD  nitroGLYCERIN (NITROSTAT) 0.4 MG SL tablet Place 0.4 mg under the tongue every 5 (five) minutes as needed for chest pain.  03/08/16   [provider]  omega-3 acid ethyl esters (LOVAZA) 1 g capsule Take 2 g by mouth 2 (two) times daily. 12/17/17   [provider]  omeprazole (PRILOSEC) 40 MG capsule Take 40 mg by mouth daily before breakfast. 10/20/21   [provider]  oxyCODONE-acetaminophen (PERCOCET) 10-325 MG tablet Take 1 tablet by mouth every 4 (four) hours as needed. 01/25/23   [provider]  potassium chloride (MICRO-K) 10 MEQ CR capsule Take 10 mEq by mouth daily. 10/20/21   [provider]    Physical Exam: BP (!) 141/65 (BP Location: Left Arm)   Pulse (!) 58   Temp 97.6 F (36.4 C) (Oral)   Resp 10   Ht 5\' 7"  (1.702 m)   Wt 83.3 kg   SpO2 97%   BMI 28.76 kg/m   General: 77 y.o. year-old male ill  appearing, but in no acute distress.  Somnolent, though easily arousable. HEENT: NCAT, EOMI, dry mucous membrane Neck: Supple, trachea medial Cardiovascular: Pacemaker insertion site noted on the chest.  Regular rate and rhythm with no rubs or gallops.  No thyromegaly or JVD noted.  No lower extremity edema. 2/4 pulses in all 4 extremities. Respiratory: Clear to auscultation with no wheezes or rales. Good inspiratory effort. Abdomen: Soft, nontender nondistended with normal bowel sounds x4 quadrants. Muskuloskeletal: No cyanosis, clubbing or edema noted bilaterally Neuro: CN II-XII intact, strength 5/5 x 4, sensation, reflexes intact Skin: No ulcerative lesions noted or rashes Psychiatry: Mood is appropriate for condition and setting          Labs on Admission:  Basic Metabolic Panel: Recent Labs  Lab 05/01/23 2009  NA 134*  K 3.8  CL 105  CO2 23  GLUCOSE 104*  BUN 16  CREATININE 1.53*  CALCIUM 8.3*  MG 2.0   Liver Function Tests: Recent Labs  Lab 05/01/23 2009  AST 17  ALT 19  ALKPHOS 86  BILITOT 0.7  PROT 6.4*  ALBUMIN 3.3*   No results for input(s): "LIPASE", "AMYLASE" in the last 168 hours. No results for input(s): "AMMONIA" in the last 168 hours. CBC: Recent Labs  Lab 05/01/23 2009  WBC 7.5  NEUTROABS 4.8  HGB 12.3*  HCT 38.2*  MCV 88.2  PLT 134*   Cardiac Enzymes: No results for input(s): "CKTOTAL", "CKMB", "CKMBINDEX", "TROPONINI" in the last 168 hours.  BNP (last 3 results) No results for input(s): "BNP" in the last 8760 hours.  ProBNP (last 3 results) No results for input(s): "PROBNP" in the last 8760 hours.  CBG: Recent Labs  Lab 05/01/23 1917  GLUCAP 121*    Radiological Exams on Admission: CT Head Wo Contrast  Result Date: 05/01/2023 CLINICAL DATA:  Head injury, weakness, lethargy EXAM: CT HEAD WITHOUT CONTRAST TECHNIQUE: Contiguous axial images were obtained from the base of the skull through the vertex without intravenous contrast.  RADIATION DOSE REDUCTION: This exam was performed according to the departmental dose-optimization program which includes automated exposure control, adjustment of the mA and/or kV according  to patient size and/or use of iterative reconstruction technique. COMPARISON:  10/28/2021 FINDINGS: Brain: No evidence of acute infarction, hemorrhage, hydrocephalus, extra-axial collection or mass lesion/mass effect. Tiny remote lacunar infarct noted within the right cerebellar hemisphere. Vascular: No hyperdense vessel or unexpected calcification. Skull: Normal. Negative for fracture or focal lesion. Sinuses/Orbits: No acute finding. Other: Mastoid air cells and middle ear cavities are clear. IMPRESSION: 1. No acute intracranial abnormality. No calvarial fracture. 2. Tiny remote lacunar infarct within the right cerebellar hemisphere. Electronically Signed   By: Helyn Numbers M.D.   On: 05/01/2023 20:07    EKG: I independently viewed the EKG done and my findings are as followed: Normal sinus rhythm at a rate of 64 bpm with prolonged PR interval and LAFB  Assessment/Plan Present on Admission:  Altered mental status  Thrombocytopenia (HCC)  Essential hypertension  Mixed hyperlipidemia  Acquired hypothyroidism  Principal Problem:   Altered mental status Active Problems:   Mixed hyperlipidemia   Essential hypertension   Acquired hypothyroidism   Type 2 diabetes mellitus without complication (HCC)   Thrombocytopenia (HCC)   Generalized weakness   Dehydration   Recurrent falls   Hypoalbuminemia due to protein-calorie malnutrition (HCC)   Paroxysmal atrial fibrillation (HCC)   Tachycardia-bradycardia syndrome (HCC)  Altered mental status possibly secondary to multifactorial This may be due to multiple factors including polypharmacy, stroke etc. CT head without contrast showed no acute intracranial abnormality MRI of head without contrast will be done in the morning (verify MRI compatibility with patient's  pacemaker;-appears that he had MRI of lumbar spine in April 2023) Consider full stroke workup for positive MRI of the brain Continue fall precaution and neurochecks  Generalized weakness This may be due to multiple factors including anemia, dehydration etc. Hemoglobin is stable at 12.3 Continue IV hydration  Dehydration Continue IV hydration  Recurrent falls Continue fall precaution Continue PT/OT eval and treat  Thrombocytopenia possibly reactive Platelets 134, continue to monitor platelet levels with morning labs  Hypoalbuminemia possibly secondary to mild protein calorie malnutrition Albumin 3.3, protein supplement will be provided  CKD 3A creatinine 1.53 (baseline creatinine at 1.4-1.6) Continue gentle hydration Renally adjust medications, avoid nephrotoxic agents/dehydration/hypotension  Essential hypertension Continue lisinopril  Paroxysmal atrial fibrillation EKG currently showed normal sinus rhythm at a rate of 64 bpm Continue Eliquis No rate control medication noted on patient's med rec  Tachycardia-bradycardia syndrome Patient has a pacemaker which was lastly reviewed on 03/07/2023 and showed normal device function  Acquired hypothyroidism Continue Synthroid  Mixed hyperlipidemia Continue Lipitor, Lovaza  GERD Continue Protonix  Type 2 diabetes mellitus Continue ISS and hypoglycemic protocol Continue Farxiga Glimepiride will be held at this time Hemoglobin A1c in the morning   DVT prophylaxis: Eliquis  Advance Care Planning: CODE STATUS: Full code  Consults: None  Family Communication: Wife at bedside (all questions answered to satisfaction)  Severity of Illness: The appropriate patient status for this patient is OBSERVATION. Observation status is judged to be reasonable and necessary in order to provide the required intensity of service to ensure the patient's safety. The patient's presenting symptoms, physical exam findings, and initial  radiographic and laboratory data in the context of their medical condition is felt to place them at decreased risk for further clinical deterioration. Furthermore, it is anticipated that the patient will be medically stable for discharge from the hospital within 2 midnights of admission.   Author: Frankey Shown, DO 05/02/2023 1:53 AM  For on call review [email or web address].

## 2023-05-01 NOTE — ED Triage Notes (Signed)
Per EMS, pt's family called due to pt being very lethargic most of the day starting this AM, not wanting to get up to eat or drink anything. Pt stated that he is always tired, but did state that he took his hydrocodone this AM but is unsure of how many he took because his wife gave them to him. Pt stated that he does not remember much of today either. Also stated that he was jumping on the trampoline with his grandkid yesterday which made his heart heart monitor start going off. Pt A&O x4 but falls back to sleep easily.

## 2023-05-02 ENCOUNTER — Observation Stay (HOSPITAL_COMMUNITY): Payer: 59

## 2023-05-02 DIAGNOSIS — E039 Hypothyroidism, unspecified: Secondary | ICD-10-CM | POA: Diagnosis not present

## 2023-05-02 DIAGNOSIS — I495 Sick sinus syndrome: Secondary | ICD-10-CM | POA: Diagnosis not present

## 2023-05-02 DIAGNOSIS — R296 Repeated falls: Secondary | ICD-10-CM

## 2023-05-02 DIAGNOSIS — I480 Paroxysmal atrial fibrillation: Secondary | ICD-10-CM | POA: Diagnosis not present

## 2023-05-02 DIAGNOSIS — R531 Weakness: Secondary | ICD-10-CM

## 2023-05-02 DIAGNOSIS — E860 Dehydration: Secondary | ICD-10-CM | POA: Insufficient documentation

## 2023-05-02 DIAGNOSIS — E119 Type 2 diabetes mellitus without complications: Secondary | ICD-10-CM | POA: Diagnosis not present

## 2023-05-02 DIAGNOSIS — E46 Unspecified protein-calorie malnutrition: Secondary | ICD-10-CM | POA: Insufficient documentation

## 2023-05-02 DIAGNOSIS — R4182 Altered mental status, unspecified: Secondary | ICD-10-CM | POA: Diagnosis not present

## 2023-05-02 LAB — CBC
HCT: 37.7 % — ABNORMAL LOW (ref 39.0–52.0)
Hemoglobin: 11.8 g/dL — ABNORMAL LOW (ref 13.0–17.0)
MCH: 28.3 pg (ref 26.0–34.0)
MCHC: 31.3 g/dL (ref 30.0–36.0)
MCV: 90.4 fL (ref 80.0–100.0)
Platelets: 121 10*3/uL — ABNORMAL LOW (ref 150–400)
RBC: 4.17 MIL/uL — ABNORMAL LOW (ref 4.22–5.81)
RDW: 14.1 % (ref 11.5–15.5)
WBC: 6.8 10*3/uL (ref 4.0–10.5)
nRBC: 0 % (ref 0.0–0.2)

## 2023-05-02 LAB — GLUCOSE, CAPILLARY
Glucose-Capillary: 87 mg/dL (ref 70–99)
Glucose-Capillary: 64 mg/dL — ABNORMAL LOW (ref 70–99)
Glucose-Capillary: 81 mg/dL (ref 70–99)
Glucose-Capillary: 104 mg/dL — ABNORMAL HIGH (ref 70–99)

## 2023-05-02 LAB — COMPREHENSIVE METABOLIC PANEL WITH GFR
ALT: 20 U/L (ref 0–44)
AST: 15 U/L (ref 15–41)
Albumin: 3 g/dL — ABNORMAL LOW (ref 3.5–5.0)
Alkaline Phosphatase: 80 U/L (ref 38–126)
Anion gap: 9 (ref 5–15)
BUN: 17 mg/dL (ref 8–23)
CO2: 23 mmol/L (ref 22–32)
Calcium: 8.4 mg/dL — ABNORMAL LOW (ref 8.9–10.3)
Chloride: 106 mmol/L (ref 98–111)
Creatinine, Ser: 1.69 mg/dL — ABNORMAL HIGH (ref 0.61–1.24)
GFR, Estimated: 42 mL/min — ABNORMAL LOW
Glucose, Bld: 80 mg/dL (ref 70–99)
Potassium: 4.2 mmol/L (ref 3.5–5.1)
Sodium: 138 mmol/L (ref 135–145)
Total Bilirubin: 0.7 mg/dL (ref 0.3–1.2)
Total Protein: 5.9 g/dL — ABNORMAL LOW (ref 6.5–8.1)

## 2023-05-02 LAB — HEMOGLOBIN A1C
Hgb A1c MFr Bld: 6.9 % — ABNORMAL HIGH (ref 4.8–5.6)
Mean Plasma Glucose: 151.33 mg/dL

## 2023-05-02 LAB — PHOSPHORUS: Phosphorus: 3.7 mg/dL (ref 2.5–4.6)

## 2023-05-02 LAB — MAGNESIUM: Magnesium: 2.2 mg/dL (ref 1.7–2.4)

## 2023-05-02 MED ORDER — ENSURE ENLIVE PO LIQD: 237.0000 mL | Freq: Two times a day (BID) | ORAL | Status: DC

## 2023-05-02 MED ORDER — PANTOPRAZOLE SODIUM 40 MG PO TBEC
80.0000 mg | DELAYED_RELEASE_TABLET | Freq: Every day | ORAL | Status: DC
  Administered 2023-05-02: 80 mg via ORAL
  Filled 2023-05-02: qty 2

## 2023-05-02 MED ORDER — DAPAGLIFLOZIN PROPANEDIOL 10 MG PO TABS
10.0000 mg | ORAL_TABLET | Freq: Every day | ORAL | Status: DC
  Administered 2023-05-02: 10 mg via ORAL
  Filled 2023-05-02: qty 1

## 2023-05-02 MED ORDER — ONDANSETRON HCL 4 MG/2ML IJ SOLN: 4.0000 mg | Freq: Four times a day (QID) | INTRAMUSCULAR | Status: DC | PRN

## 2023-05-02 MED ORDER — ONDANSETRON HCL 4 MG PO TABS: 4.0000 mg | ORAL_TABLET | Freq: Four times a day (QID) | ORAL | Status: DC | PRN

## 2023-05-02 MED ORDER — ACETAMINOPHEN 650 MG RE SUPP: 650.0000 mg | Freq: Four times a day (QID) | RECTAL | Status: DC | PRN

## 2023-05-02 MED ORDER — ACETAMINOPHEN 325 MG PO TABS
650.0000 mg | ORAL_TABLET | Freq: Four times a day (QID) | ORAL | Status: DC | PRN
  Administered 2023-05-02: 650 mg via ORAL
  Filled 2023-05-02: qty 2

## 2023-05-02 MED ORDER — ENOXAPARIN SODIUM 40 MG/0.4ML IJ SOSY: 40.0000 mg | PREFILLED_SYRINGE | INTRAMUSCULAR | Status: DC

## 2023-05-02 MED ORDER — SODIUM CHLORIDE 0.9 % IV SOLN: INTRAVENOUS | Status: DC

## 2023-05-02 MED ORDER — INSULIN ASPART 100 UNIT/ML IJ SOLN: 0.0000 [IU] | Freq: Three times a day (TID) | INTRAMUSCULAR | Status: DC

## 2023-05-02 MED ORDER — ATORVASTATIN CALCIUM 40 MG PO TABS
40.0000 mg | ORAL_TABLET | Freq: Every day | ORAL | Status: DC
  Administered 2023-05-02: 40 mg via ORAL
  Filled 2023-05-02: qty 1

## 2023-05-02 MED ORDER — LEVOTHYROXINE SODIUM 137 MCG PO TABS
137.0000 ug | ORAL_TABLET | Freq: Every day | ORAL | Status: DC
  Administered 2023-05-02: 137 ug via ORAL
  Filled 2023-05-02: qty 1

## 2023-05-02 MED ORDER — OMEGA-3-ACID ETHYL ESTERS 1 G PO CAPS
2.0000 g | ORAL_CAPSULE | Freq: Two times a day (BID) | ORAL | Status: DC
  Administered 2023-05-02: 2 g via ORAL
  Filled 2023-05-02: qty 2

## 2023-05-02 MED ORDER — APIXABAN 5 MG PO TABS
5.0000 mg | ORAL_TABLET | Freq: Two times a day (BID) | ORAL | Status: DC
  Administered 2023-05-02: 5 mg via ORAL
  Filled 2023-05-02: qty 1

## 2023-05-02 MED ORDER — LISINOPRIL 5 MG PO TABS
2.5000 mg | ORAL_TABLET | Freq: Every day | ORAL | Status: DC
  Administered 2023-05-02: 2.5 mg via ORAL
  Filled 2023-05-02: qty 1

## 2023-05-02 NOTE — Progress Notes (Signed)
Patient starting to be more alert, able to answer orientation questions. Alert and oriented x3. Disoriented to situation. Strong bilateral grips. C/o chronic back pain. Call light within reach. Bed alarm on. Wife at bedside.

## 2023-05-02 NOTE — Plan of Care (Signed)
  Problem: Activity: Goal: Risk for activity intolerance will decrease Outcome: Progressing   Problem: Coping: Goal: Level of anxiety will decrease Outcome: Progressing   Problem: Coping: Goal: Ability to adjust to condition or change in health will improve Outcome: Progressing

## 2023-05-02 NOTE — TOC Initial Note (Signed)
Transition of Care [facility]) - Initial/Assessment Note    Patient Details  Name: [name] MRN: [id number] Date of Birth: 1946-04-16  [facility] CM/SW Contact:    Karn Cassis, LCSW Phone Number: 05/02/2023, 11:31 AM  Clinical Narrative: Pt reports he lives with his daughter. His wife does not live in the home, but he indicates she assists him at home. PT evaluated pt and recommend home health and shower/tub seat. Pt states he does not need either and declines. No other needs reported at this time. Possible d/c later today.                   Expected Discharge Plan: Home/Self Care Barriers to Discharge: Continued Medical Work up   Patient Goals and CMS Choice Patient states their goals for this hospitalization and ongoing recovery are:: return home   Choice offered to / list presented to : Patient [place] ownership interest in [facility].provided to::  (n/a)    Expected Discharge Plan and Services In-house Referral: Clinical Social Work     Living arrangements for the past 2 months: Single Family Home                           HH Arranged: Refused HH          Prior Living Arrangements/Services Living arrangements for the past 2 months: Single Family Home Lives with:: Adult Children Patient language and need for interpreter reviewed:: Yes Do you feel safe going back to the place where you live?: Yes          Current home services: DME (cane, walker) Criminal Activity/Legal Involvement Pertinent to Current Situation/Hospitalization: No - Comment as needed  Activities of Daily Living Home Assistive Devices/Equipment: Cane (specify quad or straight), Eyeglasses, Hearing aid, Blood pressure cuff, CBG Meter, Dentures (specify type), Other (Comment), Walker (specify type) (Upper and lower dentures, Rollator walker) ADL Screening (condition at time of admission) Patient's cognitive ability adequate to safely complete daily  activities?: Yes Is the patient deaf or have difficulty hearing?: No Does the patient have difficulty seeing, even when wearing glasses/contacts?: No Does the patient have difficulty concentrating, remembering, or making decisions?: No Patient able to express need for assistance with ADLs?: Yes Does the patient have difficulty dressing or bathing?: No Independently performs ADLs?: Yes (appropriate for developmental age) Does the patient have difficulty walking or climbing stairs?: Yes Weakness of Legs: Both Weakness of Arms/Hands: None  Permission Sought/Granted                  Emotional Assessment     Affect (typically observed): Appropriate Orientation: : Oriented to Self, Oriented to Place, Oriented to  Time, Oriented to Situation Alcohol / Substance Use: Not Applicable Psych Involvement: No (comment)  Admission diagnosis:  Ataxia [R27.0] Altered mental status [R41.82] Weakness [R53.1] Falls frequently [R29.6] Patient Active Problem List   Diagnosis Date Noted   Generalized weakness 05/02/2023   Dehydration 05/02/2023   Recurrent falls 05/02/2023   Hypoalbuminemia due to protein-calorie malnutrition (HCC) 05/02/2023   Paroxysmal atrial fibrillation (HCC) 05/02/2023   Tachycardia-bradycardia syndrome (HCC) 05/02/2023   Altered mental status 05/01/2023   Diarrhea 04/19/2022   Gastroesophageal reflux disease 04/19/2022   Loss of weight 04/19/2022   Lack of appetite 04/19/2022   Thrombocytopenia (HCC) 02/08/2022   Normocytic anemia 02/08/2022   Right lower quadrant abdominal pain 02/08/2022   History of colonic polyps 02/08/2022  Dysphagia 02/08/2022   Constipation 02/08/2022   Cellulitis and abscess of left lower extremity 12/04/2019   Chest pain at rest 11/02/2018   Type 2 diabetes mellitus without complication (HCC) 11/02/2018   Unstable angina (HCC) 03/08/2018   Acquired hypothyroidism 03/08/2018   Anxiety 03/08/2018   RUQ pain 03/08/2018   Chronic back  pain 03/08/2018   CKD (chronic kidney disease) stage 3, GFR 30-59 ml/min (HCC) 03/08/2018   Intractable back pain 12/29/2017   Chest pain 12/28/2017   Abnormality of gait 03/24/2016   Stroke due to embolism of left carotid artery (HCC) 03/24/2016   Mixed hyperlipidemia 05/22/2009   Essential hypertension 05/22/2009   CAD, NATIVE VESSEL 05/22/2009   CHEST PAIN-UNSPECIFIED 05/22/2009   PCP:  Benita Stabile, MD Pharmacy:   [facility] - [place], [place] - [address] [place] [address] Phone: [phone] Fax: [phone]     Social Determinants of Health (SDOH) Social History: SDOH Screenings   Food Insecurity: No Food Insecurity (05/01/2023)  Housing: Low Risk  (05/01/2023)  Transportation Needs: No Transportation Needs (05/01/2023)  Utilities: Not At Risk (05/01/2023)  Depression (PHQ2-9): Low Risk  (12/11/2019)  Tobacco Use: Medium Risk (05/01/2023)   SDOH Interventions:     Readmission Risk Interventions     No data to display

## 2023-05-02 NOTE — Progress Notes (Addendum)
Pt's CBG at 0742 was 64. Gave pt juice to drink, will recheck in 15 minutes. Rechecked CBG, 81. Will continue to monitor.

## 2023-05-02 NOTE — Evaluation (Signed)
Physical Therapy Evaluation Patient Details Name: [name] MRN: [id number] DOB: November 29, 1945 Today's Date: 05/02/2023  History of Present Illness  [name] is a 77 y.o. male with medical history significant of hypertension, paroxysmal atrial fibrillation on Eliquis, tachycardia-bradycardia syndrome with pacemaker, type 2 diabetes mellitus, hypothyroidism who presents to the emergency department from home via EMS due to change in mental status. Per report, patient fell off the bed about 3 times overnight, this morning, he was lethargic throughout most of today starting in the morning, he was very tired and did not want to eat or drink anything.  Patient took home hydrocodone this morning (unsure how many he took).  He complained of headache which did not improve with Tylenol taken at home and endorsed generalized weakness.  Yesterday, he was at his baseline of functioning and was able to jump on a trampoline with a great-grandchild.  Patient denies chest pain, shortness of breath, fever, vomiting or diarrhea.   Clinical Impression  Patient had difficulty balancing using his cane during sit to stands and transferring to chair, safer using RW and able to ambulate in room/hallway with slow labored cadence without loss of balance using RW.  Patient tolerated sitting up in chair after therapy with his spouse present.  Patient will benefit from continued skilled physical therapy in hospital and recommended venue below to increase strength, balance, endurance for safe ADLs and gait.          If plan is discharge home, recommend the following: A little help with walking and/or transfers;A little help with bathing/dressing/bathroom;Help with stairs or ramp for entrance;Assistance with cooking/housework   Can travel by private vehicle        Equipment Recommendations None recommended by PT  Recommendations for Other Services       Functional Status Assessment Patient has had a recent  decline in their functional status and demonstrates the ability to make significant improvements in function in a reasonable and predictable amount of time.     Precautions / Restrictions Precautions Precautions: Fall Restrictions Weight Bearing Restrictions: No      Mobility  Bed Mobility Overal bed mobility: Modified Independent                  Transfers Overall transfer level: Needs assistance Equipment used: Straight cane, 1 person hand held assist, Rolling walker (2 wheels) Transfers: Sit to/from Stand, Bed to chair/wheelchair/BSC Sit to Stand: Contact guard assist   Step pivot transfers: Contact guard assist       General transfer comment: unsteady labored movement with diffiuclty transferring using his cane, safer using RW    Ambulation/Gait Ambulation/Gait assistance: Contact guard assist, Min assist Gait Distance (Feet): 55 Feet Assistive device: Rolling walker (2 wheels) Gait Pattern/deviations: Decreased step length - right, Decreased step length - left, Decreased stride length, Trunk flexed Gait velocity: decreased     General Gait Details: slow labored cadence without loss of balance, limited mostly due to fatigue  Stairs            Wheelchair Mobility     Tilt Bed    Modified Rankin (Stroke Patients Only)       Balance Overall balance assessment: Needs assistance Sitting-balance support: Feet supported, No upper extremity supported Sitting balance-Leahy Scale: Fair Sitting balance - Comments: fair/good seated at EOB   Standing balance support: During functional activity, Single extremity supported Standing balance-Leahy Scale: Poor Standing balance comment: fair/good using RW  Pertinent Vitals/Pain Pain Assessment Pain Assessment: No/denies pain    Home Living Family/patient expects to be discharged to:: Private residence Living Arrangements: Spouse/significant  other;Children Available Help at Discharge: Family;Available PRN/intermittently Type of Home: House Home Access: Stairs to enter Entrance Stairs-Rails: None Entrance Stairs-Number of Steps: 3   Home Layout: One level Home Equipment: Agricultural consultant (2 wheels);Cane - single point;Wheelchair - manual      Prior Function Prior Level of Function : Independent/Modified Independent;Driving;History of Falls (last six months)             Mobility Comments: household and short distanced community ambulator using [facility] ADLs Comments: Independent at baseline     Extremity/Trunk Assessment   Upper Extremity Assessment Upper Extremity Assessment: Defer to OT evaluation    Lower Extremity Assessment Lower Extremity Assessment: Generalized weakness    Cervical / Trunk Assessment Cervical / Trunk Assessment: Kyphotic  Communication   Communication Communication: No apparent difficulties  Cognition Arousal: Alert Behavior During Therapy: WFL for tasks assessed/performed Overall Cognitive Status: Within Functional Limits for tasks assessed                                          General Comments      Exercises     Assessment/Plan    PT Assessment Patient needs continued PT services  PT Problem List Decreased strength;Decreased activity tolerance;Decreased balance;Decreased mobility       PT Treatment Interventions DME instruction;Gait training;Stair training;Functional mobility training;Therapeutic activities;Therapeutic exercise;Balance training;Patient/family education    PT Goals (Current goals can be found in the Care Plan section)  Acute Rehab PT Goals Patient Stated Goal: return home with family to assist PT Goal Formulation: With patient/family Time For Goal Achievement: 05/05/23 Potential to Achieve Goals: Good    Frequency Min 3X/week     Co-evaluation               AM-PAC PT "6 Clicks" Mobility  Outcome Measure Help needed turning  from your back to your side while in a flat bed without using bedrails?: None Help needed moving from lying on your back to sitting on the side of a flat bed without using bedrails?: A Little Help needed moving to and from a bed to a chair (including a wheelchair)?: A Little Help needed standing up from a chair using your arms (e.g., wheelchair or bedside chair)?: A Little Help needed to walk in hospital room?: A Little Help needed climbing 3-5 steps with a railing? : A Lot 6 Click Score: 18    End of Session   Activity Tolerance: Patient tolerated treatment well;Patient limited by fatigue Patient left: in chair;with call bell/phone within reach;with family/visitor present Nurse Communication: Mobility status PT Visit Diagnosis: Unsteadiness on feet (R26.81);Other abnormalities of gait and mobility (R26.89);Muscle weakness (generalized) (M62.81)    Time: 4540-9811 PT Time Calculation (min) (ACUTE ONLY): 25 min   Charges:   PT Evaluation $PT Eval Moderate Complexity: 1 Mod PT Treatments $Therapeutic Activity: 23-37 mins PT General Charges $$ ACUTE PT VISIT: 1 Visit         12:26 PM, 05/02/23 Ocie Bob, MPT Physical Therapist with [facility] [phone] office [phone] mobile phone

## 2023-05-02 NOTE — Plan of Care (Signed)
  Problem: Acute Rehab PT Goals(only PT should resolve) Goal: Pt Will Go Supine/Side To Sit Outcome: Progressing Flowsheets (Taken 05/02/2023 1227) Pt will go Supine/Side to Sit: with modified independence Goal: Patient Will Transfer Sit To/From Stand Outcome: Progressing Flowsheets (Taken 05/02/2023 1227) Patient will transfer sit to/from stand:  with modified independence  with supervision Goal: Pt Will Transfer Bed To Chair/Chair To Bed Outcome: Progressing Flowsheets (Taken 05/02/2023 1227) Pt will Transfer Bed to Chair/Chair to Bed:  with modified independence  with supervision Goal: Pt Will Ambulate Outcome: Progressing Flowsheets (Taken 05/02/2023 1227) Pt will Ambulate:  75 feet  with supervision  with rolling walker   12:28 PM, 05/02/23 Ocie Bob, MPT Physical Therapist with [facility] [phone] office [phone] mobile phone

## 2023-05-02 NOTE — Progress Notes (Signed)
Ng Discharge Note  Admit Date:  05/01/2023 Discharge date: 05/02/2023   Lona Kettle to be D/C'd Home per MD order.  AVS completed. Patient/caregiver able to verbalize understanding.  Discharge Medication: Allergies as of 05/02/2023       Reactions   Morphine Other (See Comments)   Affected the heart- was told to not take this again   Tape Other (See Comments)   Certain tapes TEAR THE SKIN   Sulfonamide Derivatives Rash, Other (See Comments)   Blistering on the skin        Medication List     TAKE these medications    Accu-Chek Guide test strip Generic drug: glucose blood USE TO TEST TWICE DAILY   acetaminophen 500 MG tablet Commonly known as: TYLENOL Take 500 mg by mouth every 8 (eight) hours as needed for moderate pain or headache.   albuterol 108 (90 Base) MCG/ACT inhaler Commonly known as: VENTOLIN HFA Inhale 1 puff into the lungs every 6 (six) hours as needed for wheezing.   ALPRAZolam 1 MG tablet Commonly known as: XANAX Take 1 mg by mouth 3 (three) times daily.   atorvastatin 40 MG tablet Commonly known as: LIPITOR Take 40 mg by mouth daily.   cyanocobalamin 1000 MCG/ML injection Commonly known as: VITAMIN B12 Inject 1 mL (1,000 mcg total) into the skin every 30 (thirty) days. Start after you finish weekly dosing What changed: Another medication with the same name was removed. Continue taking this medication, and follow the directions you see here.   DULoxetine 30 MG capsule Commonly known as: CYMBALTA Take 30 mg by mouth daily.   Eliquis 5 MG Tabs tablet Generic drug: apixaban TAKE 1 TABLET BY MOUTH TWICE DAILY   Farxiga 10 MG Tabs tablet Generic drug: dapagliflozin propanediol Take 10 mg by mouth daily.   gabapentin 300 MG capsule Commonly known as: NEURONTIN Take 300 mg by mouth 3 (three) times daily.   glimepiride 2 MG tablet Commonly known as: AMARYL Take 2 mg by mouth daily.   isosorbide mononitrate 30 MG 24 hr tablet Commonly  known as: IMDUR Take 0.5 tablets (15 mg total) by mouth daily.   levothyroxine 137 MCG tablet Commonly known as: SYNTHROID Take 137 mcg by mouth daily.   lisinopril 2.5 MG tablet Commonly known as: ZESTRIL TAKE 1 TABLET BY MOUTH DAILY   nitroGLYCERIN 0.4 MG SL tablet Commonly known as: NITROSTAT Place 0.4 mg under the tongue every 5 (five) minutes as needed for chest pain.   omega-3 acid ethyl esters 1 g capsule Commonly known as: LOVAZA Take 2 g by mouth 2 (two) times daily.   omeprazole 40 MG capsule Commonly known as: PRILOSEC Take 40 mg by mouth daily before breakfast.   oxyCODONE-acetaminophen 10-325 MG tablet Commonly known as: PERCOCET Take 1 tablet by mouth every 4 (four) hours as needed.   potassium chloride 10 MEQ CR capsule Commonly known as: MICRO-K Take 10 mEq by mouth daily.        Discharge Assessment: Vitals:   05/02/23 0907 05/02/23 1450  BP: 107/67 (!) 146/65  Pulse: 63 66  Resp:    Temp: 98.2 F (36.8 C) 98.1 F (36.7 C)  SpO2:     Skin clean, dry and intact without evidence of skin break down, no evidence of skin tears noted. IV catheter discontinued intact. Site without signs and symptoms of complications - no redness or edema noted at insertion site, patient denies c/o pain - only slight tenderness at site.  Dressing  with slight pressure applied.  D/c Instructions-Education: Discharge instructions given to patient/family with verbalized understanding. D/c education completed with patient/family including follow up instructions, medication list, d/c activities limitations if indicated, with other d/c instructions as indicated by MD - patient able to verbalize understanding, all questions fully answered. Patient instructed to return to ED, call 911, or call MD for any changes in condition.  Patient escorted via WC, and D/C home via private auto.  Cristal Ford, LPN 08/28/1094 0:45 PM

## 2023-05-02 NOTE — Discharge Summary (Signed)
[name], is a 77 y.o. male  DOB 1945/12/19  MRN [id number].  Admission date:  05/01/2023  Admitting Physician  Frankey Shown, DO  Discharge Date:  05/02/2023   Primary MD  Benita Stabile, MD  Recommendations for primary care physician for things to follow:  1) physical therapy recommended to help you get stronger and reduce your risk for falling 2) follow-up with primary care physician within a week for recheck and reevaluation 3) repeat CBC and BMP blood test within a week advised 4)Avoid ibuprofen/Advil/Aleve/Motrin/Goody Powders/Naproxen/BC powders/Meloxicam/Diclofenac/Indomethacin and other Nonsteroidal anti-inflammatory medications as these will make you more likely to bleed and can cause stomach ulcers, can also cause Kidney problems.   Admission Diagnosis  Ataxia [R27.0] Altered mental status [R41.82] Weakness [R53.1] Falls frequently [R29.6]   Discharge Diagnosis  Ataxia [R27.0] Altered mental status [R41.82] Weakness [R53.1] Falls frequently [R29.6]    Principal Problem:   Altered mental status Active Problems:   Mixed hyperlipidemia   Essential hypertension   Acquired hypothyroidism   Type 2 diabetes mellitus without complication (HCC)   Thrombocytopenia (HCC)   Generalized weakness   Dehydration   Recurrent falls   Hypoalbuminemia due to protein-calorie malnutrition (HCC)   Paroxysmal atrial fibrillation (HCC)   Tachycardia-bradycardia syndrome (HCC)      Past Medical History:  Diagnosis Date   Alcohol use    Anxiety    Chest pain    Coronary artery disease    a. Nonobstructive CAD by cath 2010 with negative nuclear stress test 05/2013.   Depression    Diabetes mellitus    Hyperlipidemia    Hypertension    Hypothyroidism    Kidney problem    a. 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   Mild aortic insufficiency    PAT (paroxysmal atrial tachycardia)     Pericardial effusion    a. by echo 2014.   Pulmonary nodules    resolved   PVC's (premature ventricular contractions)    Stroke (cerebrum) (HCC)    Stroke due to embolism of left carotid artery (HCC) 03/24/2016   Left caudate head stroke   Wide-complex tachycardia    a. per [name] note: Event monitor September 2014 sinus rhythm, occasional PVCs, nonsustained PAT, and one 7 beat run of a wide QRS complex at 110-120 bpm, with minimal changes QRS axis, and QRS morphology not similar to documented PVCs.     Past Surgical History:  Procedure Laterality Date   BACK SURGERY  1996   BREAST LUMPECTOMY Left 1979   CIRCUMCISION  1980   COLONOSCOPY  10/18/2014   Dr. Teena Dunk, tubular adenoma in the ascending and distal transverse colon, hyperplastic polyp in sigmoid colon.  Recommended 3-5-year repeat.   COLONOSCOPY WITH PROPOFOL N/A 03/26/2022   Surgeon: Corbin Ade, MD; normal exam.  Repeat in 5 years due to personal history of polyps.   ESOPHAGOGASTRODUODENOSCOPY (EGD) WITH PROPOFOL N/A 03/26/2022   Surgeon: Corbin Ade, MD; Normal esophagus dilated, small hiatal hernia, normal stomach and examined duodenum.   EYE SURGERY  MALONEY DILATION N/A 03/26/2022   Procedure: Elease Hashimoto DILATION;  Surgeon: Corbin Ade, MD;  Location: [facility];  Service: Endoscopy;  Laterality: N/A;   MANDIBLE SURGERY Right 1969   PACEMAKER IMPLANT N/A 06/26/2019   Procedure: PACEMAKER IMPLANT;  Surgeon: Hillis Range, MD;  Location: [name];  Service: Cardiovascular;  Laterality: N/A;       HPI  from the history and physical done on the day of admission:   HPI: [name] is a 77 y.o. male with medical history significant of hypertension, paroxysmal atrial fibrillation on Eliquis, tachycardia-bradycardia syndrome with pacemaker, type 2 diabetes mellitus, hypothyroidism who presents to the emergency department from home via EMS due to change in mental status.  Patient was unable to  provide a history due to being somnolent, though easily aroused.  Most of the history was obtained from ED physician and wife at bedside.  Per report, patient fell off the bed about 3 times overnight, this morning, he was lethargic throughout most of today starting in the morning, he was very tired and did not want to eat or drink anything.  Patient took home hydrocodone this morning (unsure how many he took).  He complained of headache which did not improve with Tylenol taken at home and endorsed generalized weakness.  Yesterday, he was at his baseline of functioning and was able to jump on a trampoline with a great-grandchild.  Patient denies chest pain, shortness of breath, fever, vomiting or diarrhea.   ED Course:  In the emergency department, temperature was 97.17F, other vital signs are within normal range.  Workup in the ED showed normocytic anemia and platelets of 134.  BMP was normal except for sodium of 134, blood glucose 104, creatinine 1.53 (baseline creatinine at 1.4-1.6), albumin 3.3.  TSH 0.463, lactic acid was normal, troponin x 1 was normal, magnesium 2.0, SARS coronavirus 2 was negative. CT head without contrast showed no acute intracranial abnormality.  No calvarial fracture. IV hydration was provided, Toradol, Benadryl and Compazine were given.  Hospitalist was asked to admit patient for further evaluation and management.   Review of Systems: Review of systems as noted in the HPI. All other systems reviewed and are negative.    Hospital Course:     Altered mental status possibly secondary to multifactorial This may be due to multiple factors including polypharmacy, stroke etc. CT head without contrast showed no acute intracranial abnormality MRI of head without contrast will be done in the morning (verify MRI compatibility with patient's pacemaker;-appears that he had MRI of lumbar spine in April 2023) Consider full stroke workup for positive MRI of the brain Continue fall  precaution and neurochecks   Generalized weakness This may be due to multiple factors including anemia, dehydration etc. Hemoglobin is stable at 12.3 Continue IV hydration   Dehydration Continue IV hydration   Recurrent falls Continue fall precaution Continue PT/OT eval and treat   Thrombocytopenia possibly reactive Platelets 134, continue to monitor platelet levels with morning labs   Hypoalbuminemia possibly secondary to mild protein calorie malnutrition Albumin 3.3, protein supplement will be provided   CKD 3A creatinine 1.53 (baseline creatinine at 1.4-1.6) Continue gentle hydration Renally adjust medications, avoid nephrotoxic agents/dehydration/hypotension   Essential hypertension Continue lisinopril   Paroxysmal atrial fibrillation EKG currently showed normal sinus rhythm at a rate of 64 bpm Continue Eliquis No rate control medication noted on patient's med rec   Tachycardia-bradycardia syndrome Patient has a pacemaker which was lastly reviewed on 03/07/2023  and showed normal device function   Acquired hypothyroidism Continue Synthroid   Mixed hyperlipidemia Continue Lipitor, Lovaza   GERD Continue Protonix   Type 2 diabetes mellitus Continue ISS and hypoglycemic protocol Continue Farxiga Glimepiride will be held at this time Hemoglobin A1c in the morning           Discharge Condition: ***  Follow UP   Follow-up Information     Benita Stabile, MD. Schedule an appointment as soon as possible for a visit in 1 week(s).   Specialty: Internal Medicine Why: CBC and BMP-- Contact information: [address] [place] [address] [phone]                  Consults obtained - ***  Diet and Activity recommendation:  As advised  Discharge Instructions    **** Discharge Instructions     Call MD for:  difficulty breathing, headache or visual disturbances   Complete by: As directed    Call MD for:  persistant dizziness or  light-headedness   Complete by: As directed    Call MD for:  persistant nausea and vomiting   Complete by: As directed    Call MD for:  severe uncontrolled pain   Complete by: As directed    Call MD for:  temperature >100.4   Complete by: As directed    Diet - low sodium heart healthy   Complete by: As directed    Diet Carb Modified   Complete by: As directed    Discharge instructions   Complete by: As directed    1) physical therapy recommended to help you get stronger and reduce your risk for falling 2) follow-up with primary care physician within a week for recheck and reevaluation 3) repeat CBC and BMP blood test within a week advised 4)Avoid ibuprofen/Advil/Aleve/Motrin/Goody Powders/Naproxen/BC powders/Meloxicam/Diclofenac/Indomethacin and other Nonsteroidal anti-inflammatory medications as these will make you more likely to bleed and can cause stomach ulcers, can also cause Kidney problems.   Increase activity slowly   Complete by: As directed    Shower chair   Complete by: As directed    Generalized weakness and recurrent falls         Discharge Medications     Allergies as of 05/02/2023       Reactions   Morphine Other (See Comments)   Affected the heart- was told to not take this again   Tape Other (See Comments)   Certain tapes TEAR THE SKIN   Sulfonamide Derivatives Rash, Other (See Comments)   Blistering on the skin        Medication List     TAKE these medications    Accu-Chek Guide test strip Generic drug: glucose blood USE TO TEST TWICE DAILY   acetaminophen 500 MG tablet Commonly known as: TYLENOL Take 500 mg by mouth every 8 (eight) hours as needed for moderate pain or headache.   albuterol 108 (90 Base) MCG/ACT inhaler Commonly known as: VENTOLIN HFA Inhale 1 puff into the lungs every 6 (six) hours as needed for wheezing.   ALPRAZolam 1 MG tablet Commonly known as: XANAX Take 1 mg by mouth 3 (three) times daily.   atorvastatin 40 MG  tablet Commonly known as: LIPITOR Take 40 mg by mouth daily.   cyanocobalamin 1000 MCG/ML injection Commonly known as: VITAMIN B12 Inject 1 mL (1,000 mcg total) into the skin every 30 (thirty) days. Start after you finish weekly dosing What changed: Another medication with the same name  was removed. Continue taking this medication, and follow the directions you see here.   DULoxetine 30 MG capsule Commonly known as: CYMBALTA Take 30 mg by mouth daily.   Eliquis 5 MG Tabs tablet Generic drug: apixaban TAKE 1 TABLET BY MOUTH TWICE DAILY   Farxiga 10 MG Tabs tablet Generic drug: dapagliflozin propanediol Take 10 mg by mouth daily.   gabapentin 300 MG capsule Commonly known as: NEURONTIN Take 300 mg by mouth 3 (three) times daily.   glimepiride 2 MG tablet Commonly known as: AMARYL Take 2 mg by mouth daily.   isosorbide mononitrate 30 MG 24 hr tablet Commonly known as: IMDUR Take 0.5 tablets (15 mg total) by mouth daily.   levothyroxine 137 MCG tablet Commonly known as: SYNTHROID Take 137 mcg by mouth daily.   lisinopril 2.5 MG tablet Commonly known as: ZESTRIL TAKE 1 TABLET BY MOUTH DAILY   nitroGLYCERIN 0.4 MG SL tablet Commonly known as: NITROSTAT Place 0.4 mg under the tongue every 5 (five) minutes as needed for chest pain.   omega-3 acid ethyl esters 1 g capsule Commonly known as: LOVAZA Take 2 g by mouth 2 (two) times daily.   omeprazole 40 MG capsule Commonly known as: PRILOSEC Take 40 mg by mouth daily before breakfast.   oxyCODONE-acetaminophen 10-325 MG tablet Commonly known as: PERCOCET Take 1 tablet by mouth every 4 (four) hours as needed.   potassium chloride 10 MEQ CR capsule Commonly known as: MICRO-K Take 10 mEq by mouth daily.        Major procedures and Radiology Reports - PLEASE review detailed and final reports for all details, in brief -   ***  CT Head Wo Contrast  Result Date: 05/01/2023 CLINICAL DATA:  Head injury, weakness,  lethargy EXAM: CT HEAD WITHOUT CONTRAST TECHNIQUE: Contiguous axial images were obtained from the base of the skull through the vertex without intravenous contrast. RADIATION DOSE REDUCTION: This exam was performed according to the departmental dose-optimization program which includes automated exposure control, adjustment of the mA and/or kV according to patient size and/or use of iterative reconstruction technique. COMPARISON:  10/28/2021 FINDINGS: Brain: No evidence of acute infarction, hemorrhage, hydrocephalus, extra-axial collection or mass lesion/mass effect. Tiny remote lacunar infarct noted within the right cerebellar hemisphere. Vascular: No hyperdense vessel or unexpected calcification. Skull: Normal. Negative for fracture or focal lesion. Sinuses/Orbits: No acute finding. Other: Mastoid air cells and middle ear cavities are clear. IMPRESSION: 1. No acute intracranial abnormality. No calvarial fracture. 2. Tiny remote lacunar infarct within the right cerebellar hemisphere. Electronically Signed   By: Helyn Numbers M.D.   On: 05/01/2023 20:07    Micro Results   *** Recent Results (from the past 240 hour(s))  SARS Coronavirus 2 by RT PCR (hospital order, performed in [facility] hospital lab) *cepheid single result test* Anterior Nasal Swab     Status: None   Collection Time: 05/01/23  7:54 PM   Specimen: Anterior Nasal Swab  Result Value Ref Range Status   SARS Coronavirus 2 by RT PCR NEGATIVE NEGATIVE Final    Comment: (NOTE) SARS-CoV-2 target nucleic acids are NOT DETECTED.  The SARS-CoV-2 RNA is generally detectable in upper and lower respiratory specimens during the acute phase of infection. The lowest concentration of SARS-CoV-2 viral copies this assay can detect is 250 copies / mL. A negative result does not preclude SARS-CoV-2 infection and should not be used as the sole basis for treatment or other patient management decisions.  A negative result may occur  with improper  specimen collection / handling, submission of specimen other than nasopharyngeal swab, presence of viral mutation(s) within the areas targeted by this assay, and inadequate number of viral copies (<250 copies / mL). A negative result must be combined with clinical observations, patient history, and epidemiological information.  Fact Sheet for Patients:   RoadLapTop.co.za  Fact Sheet for Healthcare Providers: http://kim-miller.com/  This test is not yet approved or  cleared by the [place] FDA and has been authorized for detection and/or diagnosis of SARS-CoV-2 by FDA under an Emergency Use Authorization (EUA).  This EUA will remain in effect (meaning this test can be used) for the duration of the COVID-19 declaration under Section 564(b)(1) of the Act, 21 U.S.C. section 360bbb-3(b)(1), unless the authorization is terminated or revoked sooner.  Performed at [facility], [address], [place], [place] [address]     Today   Subjective    Tashawn Debarge today has no ***          Patient has been seen and examined prior to discharge   Objective   Blood pressure (!) 146/65, pulse 66, temperature 98.1 F (36.7 C), temperature source Oral, resp. rate 11, height 5\' 7"  (1.702 m), weight 83.3 kg, SpO2 93%.   Intake/Output Summary (Last 24 hours) at 05/02/2023 1532 Last data filed at 05/02/2023 1500 Gross per 24 hour  Intake 563.33 ml  Output --  Net 563.33 ml    Exam Gen:- Awake Alert, no acute distress *** HEENT:- [place].AT, No sclera icterus Neck-Supple Neck,No JVD,.  Lungs-  CTAB , good air movement bilaterally CV- S1, S2 normal, regular Abd-  +ve B.Sounds, Abd Soft, No tenderness,    Extremity/Skin:- No  edema,   good pulses Psych-affect is appropriate, oriented x3 Neuro-no new focal deficits, no tremors ***   Data Review   CBC w Diff:  Lab Results  Component Value Date   WBC 6.8 05/02/2023   HGB 11.8 (L)  05/02/2023   HGB 13.2 06/11/2019   HCT 37.7 (L) 05/02/2023   HCT 39.1 06/11/2019   PLT 121 (L) 05/02/2023   PLT 175 06/11/2019   LYMPHOPCT 22 05/01/2023   MONOPCT 9 05/01/2023   EOSPCT 4 05/01/2023   BASOPCT 0 05/01/2023    CMP:  Lab Results  Component Value Date   NA 138 05/02/2023   NA 141 06/11/2019   K 4.2 05/02/2023   CL 106 05/02/2023   CO2 23 05/02/2023   BUN 17 05/02/2023   BUN 15 06/11/2019   CREATININE 1.69 (H) 05/02/2023   CREATININE 1.61 (H) 04/21/2022   PROT 5.9 (L) 05/02/2023   ALBUMIN 3.0 (L) 05/02/2023   BILITOT 0.7 05/02/2023   ALKPHOS 80 05/02/2023   AST 15 05/02/2023   ALT 20 05/02/2023  .  Total Discharge time is about 33 minutes  Shon Hale M.D on 05/02/2023 at 3:32 PM  Go to [email or web address] -  for contact info  [facility] - Office  [phone]

## 2023-05-02 NOTE — TOC Transition Note (Signed)
Transition of Care [facility]) - CM/SW Discharge Note   Patient Details  Name: [name] MRN: [id number] Date of Birth: 12-11-1945  Transition [facility]) CM/SW Contact:  Karn Cassis, LCSW Phone Number: 05/02/2023, 3:36 PM   Clinical Narrative: Per MD, pt has agreed to HHPT. Referred and accepted by Clifton Custard with [facility]. Pt's wife updated. Order in.       Final next level of care: Home w Home Health Services Barriers to Discharge: Continued Medical Work up   Patient Goals and CMS Choice   Choice offered to / list presented to : Patient  Discharge Placement                         Discharge Plan and Services Additional resources added to the After Visit Summary for   In-house Referral: Clinical Social Work                        HH Arranged: PT [facility] Agency: [facility] Date [facility] Agency Contacted: 05/02/23 Time HH Agency Contacted: 1535 Representative spoke with at [facility] Agency: Clifton Custard  Social Determinants of Health (SDOH) Interventions SDOH Screenings   Food Insecurity: No Food Insecurity (05/01/2023)  Housing: Low Risk  (05/01/2023)  Transportation Needs: No Transportation Needs (05/01/2023)  Utilities: Not At Risk (05/01/2023)  Depression (PHQ2-9): Low Risk  (12/11/2019)  Tobacco Use: Medium Risk (05/01/2023)     Readmission Risk Interventions     No data to display

## 2023-05-02 NOTE — Progress Notes (Signed)
Patient arrived to the unit very drowsy, unable to stay awake to answer any questions. In the ED he was given a migraine cocktail. Wife at bedside. Bed alarm on, call light within reach.

## 2023-05-02 NOTE — Evaluation (Signed)
Occupational Therapy Evaluation Patient Details Name: [name] MRN: [id number] DOB: January 26, 1946 Today's Date: 05/02/2023   History of Present Illness [name] is a 77 y.o. male with medical history significant of hypertension, paroxysmal atrial fibrillation on Eliquis, tachycardia-bradycardia syndrome with pacemaker, type 2 diabetes mellitus, hypothyroidism who presents to the emergency department from home via EMS due to change in mental status. Per report, patient fell off the bed about 3 times overnight, this morning, he was lethargic throughout most of today starting in the morning, he was very tired and did not want to eat or drink anything.  Patient took home hydrocodone this morning (unsure how many he took).  He complained of headache which did not improve with Tylenol taken at home and endorsed generalized weakness.  Yesterday, he was at his baseline of functioning and was able to jump on a trampoline with a great-grandchild.  Patient denies chest pain, shortness of breath, fever, vomiting or diarrhea.   Clinical Impression   Pt agreeable to OT evaluation, wife present and assisting with history. Pt reports hx of back pain for 44 years, typical has sedentary lifestyle but is independent in mobility and ADLs. Pt performing tasks with supervision during evaluation, moving slowly due to back pain and he typically does not get up until after 12pm. Pt functioning close to baseline for ADL completion, wife available to assist as needed at home. No further OT services required at this time.        If plan is discharge home, recommend the following: A little help with bathing/dressing/bathroom;Assistance with cooking/housework    Functional Status Assessment  Patient has had a recent decline in their functional status and demonstrates the ability to make significant improvements in function in a reasonable and predictable amount of time.  Equipment Recommendations  Tub/shower  seat       Precautions / Restrictions Precautions Precautions: Fall Restrictions Weight Bearing Restrictions: No      Mobility Bed Mobility Overal bed mobility: Modified Independent                  Transfers Overall transfer level: Needs assistance Equipment used: Rolling walker (2 wheels) Transfers: Sit to/from Stand Sit to Stand: Supervision                      ADL either performed or assessed with clinical judgement   ADL Overall ADL's : Modified independent;At baseline                                       General ADL Comments: Increased time due to lethargy and back pain     Vision Baseline Vision/History: 0 No visual deficits Ability to See in Adequate Light: 0 Adequate Patient Visual Report: No change from baseline Vision Assessment?: No apparent visual deficits     Perception Perception: Within Functional Limits       Praxis Praxis: WFL       Pertinent Vitals/Pain Pain Assessment Pain Assessment: No/denies pain     Extremity/Trunk Assessment Upper Extremity Assessment Upper Extremity Assessment: Overall WFL for tasks assessed   Lower Extremity Assessment Lower Extremity Assessment: Defer to PT evaluation   Cervical / Trunk Assessment Cervical / Trunk Assessment: Normal   Communication Communication Communication: No apparent difficulties   Cognition Arousal: Alert Behavior During Therapy: WFL for tasks assessed/performed Overall Cognitive Status: Within Functional Limits for tasks assessed  Home Living Family/patient expects to be discharged to:: Private residence Living Arrangements: Spouse/significant other;Children Available Help at Discharge: Family;Available PRN/intermittently Type of Home: House Home Access: Stairs to enter [employer] of Steps: 3 Entrance Stairs-Rails: None Home Layout: One level     Bathroom  Shower/Tub: Chief Strategy Officer: Standard     Home Equipment: Agricultural consultant (2 wheels);Cane - single point;Wheelchair - manual          Prior Functioning/Environment Prior Level of Function : Independent/Modified Independent;Driving;History of Falls (last six months) (3 falls on Saturday)             Mobility Comments: Pt reports independent at baseline ADLs Comments: Independent at baseline        OT Problem List: Decreased activity tolerance;Pain    AM-PAC OT "6 Clicks" Daily Activity     Outcome Measure Help from another person eating meals?: None Help from another person taking care of personal grooming?: None Help from another person toileting, which includes using toliet, bedpan, or urinal?: None Help from another person bathing (including washing, rinsing, drying)?: A Little Help from another person to put on and taking off regular upper body clothing?: None Help from another person to put on and taking off regular lower body clothing?: A Little 6 Click Score: 22   End of Session Equipment Utilized During Treatment: Rolling walker (2 wheels)  Activity Tolerance: Patient tolerated treatment well Patient left: in bed;with call bell/phone within reach;with family/visitor present  OT Visit Diagnosis: Repeated falls (R29.6)                Time: 8295-6213 OT Time Calculation (min): 14 min Charges:  OT General Charges $OT Visit: 1 Visit OT Evaluation $OT Eval Low Complexity: 1 Low    Ezra Sites, OTR/L  [phone] 05/02/2023, 9:53 AM

## 2023-05-16 DIAGNOSIS — H35033 Hypertensive retinopathy, bilateral: Secondary | ICD-10-CM | POA: Diagnosis not present

## 2023-05-17 DIAGNOSIS — H3561 Retinal hemorrhage, right eye: Secondary | ICD-10-CM | POA: Diagnosis not present

## 2023-05-17 DIAGNOSIS — H353211 Exudative age-related macular degeneration, right eye, with active choroidal neovascularization: Secondary | ICD-10-CM | POA: Diagnosis not present

## 2023-05-19 ENCOUNTER — Ambulatory Visit: Payer: Medicare Other

## 2023-05-25 ENCOUNTER — Other Ambulatory Visit: Payer: Self-pay | Admitting: Cardiology

## 2023-05-25 DIAGNOSIS — I480 Paroxysmal atrial fibrillation: Secondary | ICD-10-CM

## 2023-05-25 NOTE — Telephone Encounter (Signed)
Prescription refill request for Eliquis received. Indication: AF Last office visit: 01/03/23  Dominga Ferry MD Scr: 1.69 on 05/02/23  Epic Age: 76 Weight: 82.3kg  Based on above findings Eliquis 5mg  twice daily is the appropriate dose.  Refill approved.

## 2023-06-02 DIAGNOSIS — I129 Hypertensive chronic kidney disease with stage 1 through stage 4 chronic kidney disease, or unspecified chronic kidney disease: Secondary | ICD-10-CM | POA: Diagnosis not present

## 2023-06-02 DIAGNOSIS — E039 Hypothyroidism, unspecified: Secondary | ICD-10-CM | POA: Diagnosis not present

## 2023-06-02 DIAGNOSIS — E1165 Type 2 diabetes mellitus with hyperglycemia: Secondary | ICD-10-CM | POA: Diagnosis not present

## 2023-06-08 DIAGNOSIS — E1142 Type 2 diabetes mellitus with diabetic polyneuropathy: Secondary | ICD-10-CM | POA: Diagnosis not present

## 2023-06-08 DIAGNOSIS — I129 Hypertensive chronic kidney disease with stage 1 through stage 4 chronic kidney disease, or unspecified chronic kidney disease: Secondary | ICD-10-CM | POA: Diagnosis not present

## 2023-06-08 DIAGNOSIS — E782 Mixed hyperlipidemia: Secondary | ICD-10-CM | POA: Diagnosis not present

## 2023-06-08 DIAGNOSIS — Z23 Encounter for immunization: Secondary | ICD-10-CM | POA: Diagnosis not present

## 2023-06-08 DIAGNOSIS — E039 Hypothyroidism, unspecified: Secondary | ICD-10-CM | POA: Diagnosis not present

## 2023-06-08 DIAGNOSIS — E1122 Type 2 diabetes mellitus with diabetic chronic kidney disease: Secondary | ICD-10-CM | POA: Diagnosis not present

## 2023-06-08 DIAGNOSIS — N1832 Chronic kidney disease, stage 3b: Secondary | ICD-10-CM | POA: Diagnosis not present

## 2023-06-08 DIAGNOSIS — E1165 Type 2 diabetes mellitus with hyperglycemia: Secondary | ICD-10-CM | POA: Diagnosis not present

## 2023-06-08 DIAGNOSIS — K219 Gastro-esophageal reflux disease without esophagitis: Secondary | ICD-10-CM | POA: Diagnosis not present

## 2023-06-08 DIAGNOSIS — M545 Low back pain, unspecified: Secondary | ICD-10-CM | POA: Diagnosis not present

## 2023-06-08 DIAGNOSIS — G894 Chronic pain syndrome: Secondary | ICD-10-CM | POA: Diagnosis not present

## 2023-06-08 LAB — CUP PACEART REMOTE DEVICE CHECK
Battery Remaining Longevity: 89 mo
Battery Remaining Percentage: 73 %
Battery Voltage: 2.99 V
Brady Statistic AP VP Percent: 1 %
Brady Statistic AP VS Percent: 19 %
Brady Statistic AS VP Percent: 1 %
Brady Statistic AS VS Percent: 81 %
Brady Statistic RA Percent Paced: 19 %
Brady Statistic RV Percent Paced: 1 %
Date Time Interrogation Session: 20240926034741
Implantable Lead Connection Status: 753985
Implantable Lead Connection Status: 753985
Implantable Lead Implant Date: 20201103
Implantable Lead Implant Date: 20201103
Implantable Lead Location: 753859
Implantable Lead Location: 753860
Implantable Pulse Generator Implant Date: 20201103
Lead Channel Impedance Value: 380 Ohm
Lead Channel Impedance Value: 480 Ohm
Lead Channel Pacing Threshold Amplitude: 0.5 V
Lead Channel Pacing Threshold Amplitude: 0.75 V
Lead Channel Pacing Threshold Pulse Width: 0.5 ms
Lead Channel Pacing Threshold Pulse Width: 0.5 ms
Lead Channel Sensing Intrinsic Amplitude: 1.7 mV
Lead Channel Sensing Intrinsic Amplitude: 10.1 mV
Lead Channel Setting Pacing Amplitude: 2 V
Lead Channel Setting Pacing Amplitude: 2.5 V
Lead Channel Setting Pacing Pulse Width: 0.5 ms
Lead Channel Setting Sensing Sensitivity: 2 mV
Pulse Gen Model: 2272
Pulse Gen Serial Number: 9174806

## 2023-06-13 ENCOUNTER — Other Ambulatory Visit: Payer: Self-pay

## 2023-06-13 DIAGNOSIS — E538 Deficiency of other specified B group vitamins: Secondary | ICD-10-CM

## 2023-06-21 ENCOUNTER — Other Ambulatory Visit (HOSPITAL_COMMUNITY)
Admission: RE | Admit: 2023-06-21 | Discharge: 2023-06-21 | Disposition: A | Discharge status: Home / Self Care | Payer: 59 | Source: Ambulatory Visit | Attending: Gastroenterology | Admitting: Gastroenterology

## 2023-06-21 DIAGNOSIS — E538 Deficiency of other specified B group vitamins: Secondary | ICD-10-CM | POA: Diagnosis not present

## 2023-06-21 LAB — VITAMIN B12: Vitamin B-12: 426 pg/mL (ref 180–914)

## 2023-06-25 ENCOUNTER — Other Ambulatory Visit: Payer: Self-pay | Admitting: Cardiology

## 2023-07-01 ENCOUNTER — Other Ambulatory Visit: Payer: Self-pay

## 2023-07-01 ENCOUNTER — Encounter (HOSPITAL_COMMUNITY): Payer: Self-pay

## 2023-07-01 ENCOUNTER — Emergency Department (HOSPITAL_COMMUNITY): Payer: 59

## 2023-07-01 ENCOUNTER — Inpatient Hospital Stay (HOSPITAL_COMMUNITY)
Admission: EM | Admit: 2023-07-01 | Discharge: 2023-07-04 | DRG: 194 | Disposition: A | Discharge status: Home / Self Care | Payer: 59 | Attending: Family Medicine | Admitting: Family Medicine

## 2023-07-01 DIAGNOSIS — G8929 Other chronic pain: Secondary | ICD-10-CM | POA: Diagnosis present

## 2023-07-01 DIAGNOSIS — N1832 Chronic kidney disease, stage 3b: Secondary | ICD-10-CM | POA: Diagnosis present

## 2023-07-01 DIAGNOSIS — Z801 Family history of malignant neoplasm of trachea, bronchus and lung: Secondary | ICD-10-CM

## 2023-07-01 DIAGNOSIS — I129 Hypertensive chronic kidney disease with stage 1 through stage 4 chronic kidney disease, or unspecified chronic kidney disease: Secondary | ICD-10-CM | POA: Diagnosis not present

## 2023-07-01 DIAGNOSIS — R0602 Shortness of breath: Secondary | ICD-10-CM | POA: Diagnosis not present

## 2023-07-01 DIAGNOSIS — I495 Sick sinus syndrome: Secondary | ICD-10-CM | POA: Diagnosis present

## 2023-07-01 DIAGNOSIS — F419 Anxiety disorder, unspecified: Secondary | ICD-10-CM | POA: Diagnosis not present

## 2023-07-01 DIAGNOSIS — Z7984 Long term (current) use of oral hypoglycemic drugs: Secondary | ICD-10-CM | POA: Diagnosis not present

## 2023-07-01 DIAGNOSIS — Z79899 Other long term (current) drug therapy: Secondary | ICD-10-CM

## 2023-07-01 DIAGNOSIS — E1122 Type 2 diabetes mellitus with diabetic chronic kidney disease: Secondary | ICD-10-CM | POA: Diagnosis not present

## 2023-07-01 DIAGNOSIS — Z950 Presence of cardiac pacemaker: Secondary | ICD-10-CM | POA: Diagnosis not present

## 2023-07-01 DIAGNOSIS — Z743 Need for continuous supervision: Secondary | ICD-10-CM | POA: Diagnosis not present

## 2023-07-01 DIAGNOSIS — K649 Unspecified hemorrhoids: Secondary | ICD-10-CM | POA: Diagnosis present

## 2023-07-01 DIAGNOSIS — E782 Mixed hyperlipidemia: Secondary | ICD-10-CM | POA: Diagnosis not present

## 2023-07-01 DIAGNOSIS — I10 Essential (primary) hypertension: Secondary | ICD-10-CM | POA: Diagnosis present

## 2023-07-01 DIAGNOSIS — Z800 Family history of malignant neoplasm of digestive organs: Secondary | ICD-10-CM | POA: Diagnosis not present

## 2023-07-01 DIAGNOSIS — Z823 Family history of stroke: Secondary | ICD-10-CM

## 2023-07-01 DIAGNOSIS — Z8673 Personal history of transient ischemic attack (TIA), and cerebral infarction without residual deficits: Secondary | ICD-10-CM

## 2023-07-01 DIAGNOSIS — Z91048 Other nonmedicinal substance allergy status: Secondary | ICD-10-CM

## 2023-07-01 DIAGNOSIS — Z7901 Long term (current) use of anticoagulants: Secondary | ICD-10-CM | POA: Diagnosis not present

## 2023-07-01 DIAGNOSIS — E039 Hypothyroidism, unspecified: Secondary | ICD-10-CM | POA: Diagnosis present

## 2023-07-01 DIAGNOSIS — J984 Other disorders of lung: Secondary | ICD-10-CM | POA: Diagnosis not present

## 2023-07-01 DIAGNOSIS — Z882 Allergy status to sulfonamides status: Secondary | ICD-10-CM

## 2023-07-01 DIAGNOSIS — Z1152 Encounter for screening for COVID-19: Secondary | ICD-10-CM | POA: Diagnosis not present

## 2023-07-01 DIAGNOSIS — I480 Paroxysmal atrial fibrillation: Secondary | ICD-10-CM | POA: Diagnosis not present

## 2023-07-01 DIAGNOSIS — Z87891 Personal history of nicotine dependence: Secondary | ICD-10-CM | POA: Diagnosis not present

## 2023-07-01 DIAGNOSIS — K219 Gastro-esophageal reflux disease without esophagitis: Secondary | ICD-10-CM | POA: Diagnosis present

## 2023-07-01 DIAGNOSIS — E538 Deficiency of other specified B group vitamins: Secondary | ICD-10-CM

## 2023-07-01 DIAGNOSIS — F32A Depression, unspecified: Secondary | ICD-10-CM | POA: Diagnosis present

## 2023-07-01 DIAGNOSIS — Z8052 Family history of malignant neoplasm of bladder: Secondary | ICD-10-CM

## 2023-07-01 DIAGNOSIS — N200 Calculus of kidney: Secondary | ICD-10-CM | POA: Diagnosis not present

## 2023-07-01 DIAGNOSIS — I2583 Coronary atherosclerosis due to lipid rich plaque: Secondary | ICD-10-CM | POA: Diagnosis not present

## 2023-07-01 DIAGNOSIS — R531 Weakness: Secondary | ICD-10-CM | POA: Diagnosis not present

## 2023-07-01 DIAGNOSIS — I2510 Atherosclerotic heart disease of native coronary artery without angina pectoris: Secondary | ICD-10-CM | POA: Diagnosis not present

## 2023-07-01 DIAGNOSIS — J189 Pneumonia, unspecified organism: Principal | ICD-10-CM | POA: Diagnosis present

## 2023-07-01 DIAGNOSIS — R918 Other nonspecific abnormal finding of lung field: Secondary | ICD-10-CM | POA: Diagnosis not present

## 2023-07-01 DIAGNOSIS — R1084 Generalized abdominal pain: Secondary | ICD-10-CM | POA: Diagnosis not present

## 2023-07-01 DIAGNOSIS — Z885 Allergy status to narcotic agent status: Secondary | ICD-10-CM

## 2023-07-01 DIAGNOSIS — Z860100 Personal history of colon polyps, unspecified: Secondary | ICD-10-CM

## 2023-07-01 DIAGNOSIS — F0394 Unspecified dementia, unspecified severity, with anxiety: Secondary | ICD-10-CM | POA: Diagnosis present

## 2023-07-01 DIAGNOSIS — N179 Acute kidney failure, unspecified: Secondary | ICD-10-CM | POA: Insufficient documentation

## 2023-07-01 DIAGNOSIS — F0392 Unspecified dementia, unspecified severity, with psychotic disturbance: Secondary | ICD-10-CM | POA: Diagnosis present

## 2023-07-01 DIAGNOSIS — F1320 Sedative, hypnotic or anxiolytic dependence, uncomplicated: Secondary | ICD-10-CM | POA: Diagnosis present

## 2023-07-01 DIAGNOSIS — Z833 Family history of diabetes mellitus: Secondary | ICD-10-CM

## 2023-07-01 DIAGNOSIS — Z79890 Hormone replacement therapy (postmenopausal): Secondary | ICD-10-CM

## 2023-07-01 DIAGNOSIS — H9190 Unspecified hearing loss, unspecified ear: Secondary | ICD-10-CM | POA: Diagnosis present

## 2023-07-01 LAB — COMPREHENSIVE METABOLIC PANEL
ALT: 14 U/L (ref 0–44)
AST: 12 U/L — ABNORMAL LOW (ref 15–41)
Albumin: 3.3 g/dL — ABNORMAL LOW (ref 3.5–5.0)
Alkaline Phosphatase: 78 U/L (ref 38–126)
Anion gap: 7 (ref 5–15)
BUN: 19 mg/dL (ref 8–23)
CO2: 20 mmol/L — ABNORMAL LOW (ref 22–32)
Calcium: 8.5 mg/dL — ABNORMAL LOW (ref 8.9–10.3)
Chloride: 106 mmol/L (ref 98–111)
Creatinine, Ser: 2.14 mg/dL — ABNORMAL HIGH (ref 0.61–1.24)
GFR, Estimated: 31 mL/min — ABNORMAL LOW (ref >60)
Glucose, Bld: 179 mg/dL — ABNORMAL HIGH (ref 70–99)
Potassium: 3.9 mmol/L (ref 3.5–5.1)
Sodium: 133 mmol/L — ABNORMAL LOW (ref 135–145)
Total Bilirubin: 1.1 mg/dL (ref <1.2)
Total Protein: 6.5 g/dL (ref 6.5–8.1)

## 2023-07-01 LAB — URINALYSIS, ROUTINE W REFLEX MICROSCOPIC
Bacteria, UA: NONE SEEN
Bilirubin Urine: NEGATIVE
Glucose, UA: >=500 mg/dL — AB
Hgb urine dipstick: NEGATIVE
Ketones, ur: NEGATIVE mg/dL
Leukocytes,Ua: NEGATIVE
Nitrite: NEGATIVE
Protein, ur: NEGATIVE mg/dL
Specific Gravity, Urine: 1.024 (ref 1.005–1.030)
pH: 5 (ref 5.0–8.0)

## 2023-07-01 LAB — CBC WITH DIFFERENTIAL/PLATELET
Abs Immature Granulocytes: 0.07 10*3/uL (ref 0.00–0.07)
Basophils Absolute: 0 10*3/uL (ref 0.0–0.1)
Basophils Relative: 0 %
Eosinophils Absolute: 0.1 10*3/uL (ref 0.0–0.5)
Eosinophils Relative: 1 %
HCT: 40.7 % (ref 39.0–52.0)
Hemoglobin: 12.8 g/dL — ABNORMAL LOW (ref 13.0–17.0)
Immature Granulocytes: 0 %
Lymphocytes Relative: 10 %
Lymphs Abs: 1.7 10*3/uL (ref 0.7–4.0)
MCH: 28.3 pg (ref 26.0–34.0)
MCHC: 31.4 g/dL (ref 30.0–36.0)
MCV: 89.8 fL (ref 80.0–100.0)
Monocytes Absolute: 1.4 10*3/uL — ABNORMAL HIGH (ref 0.1–1.0)
Monocytes Relative: 8 %
Neutro Abs: 13.5 10*3/uL — ABNORMAL HIGH (ref 1.7–7.7)
Neutrophils Relative %: 81 %
Platelets: 132 10*3/uL — ABNORMAL LOW (ref 150–400)
RBC: 4.53 MIL/uL (ref 4.22–5.81)
RDW: 14.3 % (ref 11.5–15.5)
WBC: 16.8 10*3/uL — ABNORMAL HIGH (ref 4.0–10.5)
nRBC: 0 % (ref 0.0–0.2)

## 2023-07-01 LAB — TROPONIN I (HIGH SENSITIVITY)
Troponin I (High Sensitivity): 7 ng/L (ref <18)
Troponin I (High Sensitivity): 7 ng/L (ref <18)

## 2023-07-01 LAB — RESP PANEL BY RT-PCR (RSV, FLU A&B, COVID)  RVPGX2
Influenza A by PCR: NEGATIVE
Influenza B by PCR: NEGATIVE
Resp Syncytial Virus by PCR: NEGATIVE
SARS Coronavirus 2 by RT PCR: NEGATIVE

## 2023-07-01 LAB — BRAIN NATRIURETIC PEPTIDE: B Natriuretic Peptide: 196 pg/mL — ABNORMAL HIGH (ref 0.0–100.0)

## 2023-07-01 LAB — LIPASE, BLOOD: Lipase: 23 U/L (ref 11–51)

## 2023-07-01 MED ORDER — IOHEXOL 300 MG/ML  SOLN
80.0000 mL | Freq: Once | INTRAMUSCULAR | Status: AC | PRN
  Administered 2023-07-01: 80 mL via INTRAVENOUS

## 2023-07-01 MED ORDER — SODIUM CHLORIDE 0.9 % IV BOLUS
1000.0000 mL | Freq: Once | INTRAVENOUS | Status: AC
  Administered 2023-07-01: 1000 mL via INTRAVENOUS

## 2023-07-01 MED ORDER — HYDROMORPHONE HCL 1 MG/ML IJ SOLN
1.0000 mg | Freq: Once | INTRAMUSCULAR | Status: AC
  Administered 2023-07-01: 1 mg via INTRAVENOUS
  Filled 2023-07-01: qty 1

## 2023-07-01 MED ORDER — SODIUM CHLORIDE 0.9 % IV SOLN
1.0000 g | Freq: Once | INTRAVENOUS | Status: AC
  Administered 2023-07-01: 1 g via INTRAVENOUS
  Filled 2023-07-01: qty 10

## 2023-07-01 MED ORDER — ONDANSETRON HCL 4 MG/2ML IJ SOLN
4.0000 mg | Freq: Once | INTRAMUSCULAR | Status: AC
  Administered 2023-07-01: 4 mg via INTRAVENOUS
  Filled 2023-07-01: qty 2

## 2023-07-01 MED ORDER — DEXTROSE 5 % IV SOLN
500.0000 mg | Freq: Once | INTRAVENOUS | Status: AC
  Administered 2023-07-01: 500 mg via INTRAVENOUS
  Filled 2023-07-01: qty 5

## 2023-07-01 NOTE — ED Notes (Signed)
Patient transported to CT 

## 2023-07-01 NOTE — ED Provider Notes (Signed)
[place] EMERGENCY DEPARTMENT AT [facility] Provider Note   CSN: [id number] Arrival date & time: 07/01/23  1732     History {Add pertinent medical, surgical, social history, OB history to HPI:1} Chief Complaint  Patient presents with  . Weakness   HPI [name] is a 77 y.o. male with hypertension, paroxysmal atrial fibrillation on Eliquis, tachycardia-bradycardia syndrome with pacemaker, type 2 diabetes mellitus, hypothyroidism presenting for generalized weakness.  Started 3 days ago.  States at that time he did receive his COVID-vaccine.  States that weakness is all over.  Feeling tired and sleeping more.  Also mention that he has a cough and fever at home.  Patient also stated that he is having some pain in his upper abdomen for a couple of weeks with nausea but no vomiting or diarrhea.  States he was diagnosed with an abdominal hernia in that area of his abdomen several years ago.   Weakness      Home Medications Prior to Admission medications   Medication Sig Start Date End Date Taking? Authorizing Provider  ACCU-CHEK GUIDE test strip USE TO TEST TWICE DAILY 08/24/21   [provider]  acetaminophen (TYLENOL) 500 MG tablet Take 500 mg by mouth every 8 (eight) hours as needed for moderate pain or headache.    [provider]  albuterol (VENTOLIN HFA) 108 (90 Base) MCG/ACT inhaler Inhale 1 puff into the lungs every 6 (six) hours as needed for wheezing.    [provider]  ALPRAZolam Prudy Feeler) 1 MG tablet Take 1 mg by mouth 3 (three) times daily. 01/18/16   [provider]  atorvastatin (LIPITOR) 40 MG tablet Take 40 mg by mouth daily. 10/20/21   [provider]  cyanocobalamin (VITAMIN B12) 1000 MCG/ML injection Inject 1 mL (1,000 mcg total) into the skin every 30 (thirty) days. Start after you finish weekly dosing 03/20/23   Tiffany Kocher, PA-C  DULoxetine (CYMBALTA) 30 MG capsule Take 30 mg by mouth daily. 04/06/22    [provider]  ELIQUIS 5 MG TABS tablet TAKE 1 TABLET BY MOUTH TWICE DAILY 05/25/23   Antoine Poche, MD  FARXIGA 10 MG TABS tablet Take 10 mg by mouth daily. 10/20/21   [provider]  gabapentin (NEURONTIN) 300 MG capsule Take 300 mg by mouth 3 (three) times daily. 12/17/17   [provider]  glimepiride (AMARYL) 2 MG tablet Take 2 mg by mouth daily. 01/29/21   [provider]  isosorbide mononitrate (IMDUR) 30 MG 24 hr tablet TAKE 1 TABLET BY MOUTH EVERY DAY 06/27/23   Antoine Poche, MD  levothyroxine (SYNTHROID) 137 MCG tablet Take 137 mcg by mouth daily. 10/20/21   [provider]  lisinopril (ZESTRIL) 2.5 MG tablet TAKE 1 TABLET BY MOUTH DAILY 06/27/23   Antoine Poche, MD  nitroGLYCERIN (NITROSTAT) 0.4 MG SL tablet Place 0.4 mg under the tongue every 5 (five) minutes as needed for chest pain.  03/08/16   [provider]  omega-3 acid ethyl esters (LOVAZA) 1 g capsule Take 2 g by mouth 2 (two) times daily. 12/17/17   [provider]  omeprazole (PRILOSEC) 40 MG capsule Take 40 mg by mouth daily before breakfast. 10/20/21   [provider]  oxyCODONE-acetaminophen (PERCOCET) 10-325 MG tablet Take 1 tablet by mouth every 4 (four) hours as needed. 01/25/23   [provider]  potassium chloride (MICRO-K) 10 MEQ CR capsule Take 10 mEq by mouth daily. 10/20/21   [provider]      Allergies    Morphine, Tape, and Sulfonamide derivatives    Review of Systems   Review of Systems  Neurological:  Positive for weakness.    Physical Exam Updated Vital Signs BP (!) 101/46 (BP Location: Right Arm)   Pulse 70   Temp 98.6 F (37 C) (Oral)   Resp 16   Ht 5\' 8"  (1.727 m)   Wt 86.2 kg   SpO2 91%   BMI 28.89 kg/m  Physical Exam  ED Results / Procedures / Treatments   Labs (all labs ordered are listed, but only abnormal results are displayed) Labs Reviewed  CBC WITH DIFFERENTIAL/PLATELET -  Abnormal; Notable for the following components:      Result Value   WBC 16.8 (*)    Hemoglobin 12.8 (*)    Platelets 132 (*)    Neutro Abs 13.5 (*)    Monocytes Absolute 1.4 (*)    All other components within normal limits  COMPREHENSIVE METABOLIC PANEL - Abnormal; Notable for the following components:   Sodium 133 (*)    CO2 20 (*)    Glucose, Bld 179 (*)    Creatinine, Ser 2.14 (*)    Calcium 8.5 (*)    Albumin 3.3 (*)    AST 12 (*)    GFR, Estimated 31 (*)    All other components within normal limits  BRAIN NATRIURETIC PEPTIDE - Abnormal; Notable for the following components:   B Natriuretic Peptide 196.0 (*)    All other components within normal limits  RESP PANEL BY RT-PCR (RSV, FLU A&B, COVID)  RVPGX2  LIPASE, BLOOD  URINALYSIS, ROUTINE W REFLEX MICROSCOPIC    EKG None  Radiology No results found.  Procedures Procedures  {Document cardiac monitor, telemetry assessment procedure when appropriate:1}  Medications Ordered in ED Medications  sodium chloride 0.9 % bolus 1,000 mL (has no administration in time range)  ondansetron (ZOFRAN) injection 4 mg (has no administration in time range)  HYDROmorphone (DILAUDID) injection 1 mg (has no administration in time range)    ED Course/ Medical Decision Making/ A&P Clinical Course as of 07/01/23 2146  [facility] Jul 01, 2023  2126 Creatinine(!): 2.14 aki [SG]    Clinical Course User Index [SG] Sloan Leiter, DO   {   Click here for ABCD2, HEART and other calculatorsREFRESH Note before signing :1}                              Medical Decision Making Amount and/or Complexity of Data Reviewed Labs: ordered. Radiology: ordered.  Risk Prescription drug management.   ***  {Document critical care time when appropriate:1} {Document review of labs and clinical decision tools ie heart score, Chads2Vasc2 etc:1}  {Document your independent review of radiology images, and any outside records:1} {Document your discussion  with family members, caretakers, and with consultants:1} {Document social determinants of health affecting pt's care:1} {Document your decision making why or why not admission, treatments were needed:1} Final Clinical Impression(s) / ED Diagnoses Final diagnoses:  None    Rx / DC Orders ED Discharge Orders     None

## 2023-07-01 NOTE — ED Triage Notes (Signed)
Recd COVID vaccine 3 days ago Has been tired and sleeping since Complains of general weakness  EMS BGL 178 EMS vitals 130/60 90-95% RA 70HR 97.8oral temp  Pt denies all pain in triage Pt complains of cough x2 days

## 2023-07-01 NOTE — ED Provider Triage Note (Signed)
Emergency Medicine Provider Triage Evaluation Note  [name] , a 77 y.o. male  was evaluated in triage.  Pt complains of states today he just does not feel well having some nausea and right upper quadrant pain.  States he has chronic abdominal pain but is much worse today.  No vomiting.  No fevers or chills.  No chest pain or shortness of breath.  Review of Systems  Positive: Right upper quadrant abdominal pain Negative: Chest pain, shortness of breath  Physical Exam  BP (!) 101/46 (BP Location: Right Arm)   Pulse 70   Temp 98.6 F (37 C) (Oral)   Resp 16   Ht 5\' 8"  (1.727 m)   Wt 86.2 kg   SpO2 91%   BMI 28.89 kg/m  Gen:   Awake, no distress   Resp:  Normal effort  MSK:   Moves extremities without difficulty  Other:    Medical Decision Making  Medically screening exam initiated at 6:18 PM.  Appropriate orders placed.  Lona Kettle was informed that the remainder of the evaluation will be completed by another provider, this initial triage assessment does not replace that evaluation, and the importance of remaining in the ED until their evaluation is complete.     Ma Rings, [place] 07/01/23 1819

## 2023-07-01 NOTE — ED Notes (Signed)
Pt returned from CT Scan 

## 2023-07-02 DIAGNOSIS — N179 Acute kidney failure, unspecified: Secondary | ICD-10-CM | POA: Insufficient documentation

## 2023-07-02 DIAGNOSIS — K219 Gastro-esophageal reflux disease without esophagitis: Secondary | ICD-10-CM

## 2023-07-02 DIAGNOSIS — F419 Anxiety disorder, unspecified: Secondary | ICD-10-CM | POA: Diagnosis not present

## 2023-07-02 DIAGNOSIS — J189 Pneumonia, unspecified organism: Secondary | ICD-10-CM | POA: Diagnosis present

## 2023-07-02 DIAGNOSIS — I10 Essential (primary) hypertension: Secondary | ICD-10-CM

## 2023-07-02 DIAGNOSIS — E039 Hypothyroidism, unspecified: Secondary | ICD-10-CM

## 2023-07-02 LAB — COMPREHENSIVE METABOLIC PANEL
ALT: 14 U/L (ref 0–44)
AST: 11 U/L — ABNORMAL LOW (ref 15–41)
Albumin: 3.2 g/dL — ABNORMAL LOW (ref 3.5–5.0)
Alkaline Phosphatase: 78 U/L (ref 38–126)
Anion gap: 8 (ref 5–15)
BUN: 18 mg/dL (ref 8–23)
CO2: 22 mmol/L (ref 22–32)
Calcium: 8.4 mg/dL — ABNORMAL LOW (ref 8.9–10.3)
Chloride: 105 mmol/L (ref 98–111)
Creatinine, Ser: 1.9 mg/dL — ABNORMAL HIGH (ref 0.61–1.24)
GFR, Estimated: 36 mL/min — ABNORMAL LOW (ref >60)
Glucose, Bld: 136 mg/dL — ABNORMAL HIGH (ref 70–99)
Potassium: 4.5 mmol/L (ref 3.5–5.1)
Sodium: 135 mmol/L (ref 135–145)
Total Bilirubin: 0.6 mg/dL (ref <1.2)
Total Protein: 6.4 g/dL — ABNORMAL LOW (ref 6.5–8.1)

## 2023-07-02 LAB — CBC WITH DIFFERENTIAL/PLATELET
Abs Immature Granulocytes: 0.05 10*3/uL (ref 0.00–0.07)
Basophils Absolute: 0 10*3/uL (ref 0.0–0.1)
Basophils Relative: 0 %
Eosinophils Absolute: 0.1 10*3/uL (ref 0.0–0.5)
Eosinophils Relative: 1 %
HCT: 40.8 % (ref 39.0–52.0)
Hemoglobin: 12.6 g/dL — ABNORMAL LOW (ref 13.0–17.0)
Immature Granulocytes: 0 %
Lymphocytes Relative: 10 %
Lymphs Abs: 1.5 10*3/uL (ref 0.7–4.0)
MCH: 28 pg (ref 26.0–34.0)
MCHC: 30.9 g/dL (ref 30.0–36.0)
MCV: 90.7 fL (ref 80.0–100.0)
Monocytes Absolute: 1 10*3/uL (ref 0.1–1.0)
Monocytes Relative: 7 %
Neutro Abs: 11.4 10*3/uL — ABNORMAL HIGH (ref 1.7–7.7)
Neutrophils Relative %: 82 %
Platelets: 126 10*3/uL — ABNORMAL LOW (ref 150–400)
RBC: 4.5 MIL/uL (ref 4.22–5.81)
RDW: 14.5 % (ref 11.5–15.5)
WBC: 14 10*3/uL — ABNORMAL HIGH (ref 4.0–10.5)
nRBC: 0 % (ref 0.0–0.2)

## 2023-07-02 LAB — TSH: TSH: 0.351 u[IU]/mL (ref 0.350–4.500)

## 2023-07-02 LAB — MAGNESIUM: Magnesium: 2.1 mg/dL (ref 1.7–2.4)

## 2023-07-02 MED ORDER — OXYCODONE HCL 5 MG PO TABS
10.0000 mg | ORAL_TABLET | ORAL | Status: DC | PRN
  Administered 2023-07-02 – 2023-07-04 (×4): 10 mg via ORAL
  Filled 2023-07-02 (×4): qty 2

## 2023-07-02 MED ORDER — LEVOTHYROXINE SODIUM 137 MCG PO TABS
137.0000 ug | ORAL_TABLET | Freq: Every day | ORAL | Status: DC
  Administered 2023-07-02 – 2023-07-04 (×3): 137 ug via ORAL
  Filled 2023-07-02 (×3): qty 1

## 2023-07-02 MED ORDER — ACETAMINOPHEN 650 MG RE SUPP
650.0000 mg | Freq: Four times a day (QID) | RECTAL | Status: DC | PRN
Start: 2023-07-02 — End: 2023-07-04

## 2023-07-02 MED ORDER — ATORVASTATIN CALCIUM 40 MG PO TABS
40.0000 mg | ORAL_TABLET | Freq: Every day | ORAL | Status: DC
  Administered 2023-07-02 – 2023-07-04 (×3): 40 mg via ORAL
  Filled 2023-07-02 (×3): qty 1

## 2023-07-02 MED ORDER — ISOSORBIDE MONONITRATE ER 30 MG PO TB24
30.0000 mg | ORAL_TABLET | Freq: Every day | ORAL | Status: DC
  Administered 2023-07-02 – 2023-07-04 (×3): 30 mg via ORAL
  Filled 2023-07-02 (×3): qty 1

## 2023-07-02 MED ORDER — GABAPENTIN 100 MG PO CAPS
200.0000 mg | ORAL_CAPSULE | Freq: Three times a day (TID) | ORAL | Status: DC
  Administered 2023-07-02 – 2023-07-03 (×4): 200 mg via ORAL
  Filled 2023-07-02 (×5): qty 2

## 2023-07-02 MED ORDER — DEXTROSE 5 % IV SOLN
500.0000 mg | Freq: Every day | INTRAVENOUS | Status: DC
  Administered 2023-07-02 – 2023-07-03 (×2): 500 mg via INTRAVENOUS
  Filled 2023-07-02 (×2): qty 5

## 2023-07-02 MED ORDER — ALPRAZOLAM 1 MG PO TABS
1.0000 mg | ORAL_TABLET | Freq: Three times a day (TID) | ORAL | Status: DC
  Administered 2023-07-02 – 2023-07-03 (×4): 1 mg via ORAL
  Filled 2023-07-02 (×5): qty 1

## 2023-07-02 MED ORDER — ACETAMINOPHEN 325 MG PO TABS
650.0000 mg | ORAL_TABLET | Freq: Four times a day (QID) | ORAL | Status: DC | PRN
  Administered 2023-07-02 – 2023-07-04 (×2): 650 mg via ORAL
  Filled 2023-07-02 (×2): qty 2

## 2023-07-02 MED ORDER — DULOXETINE HCL 30 MG PO CPEP
30.0000 mg | ORAL_CAPSULE | Freq: Every day | ORAL | Status: DC
  Administered 2023-07-02 – 2023-07-04 (×3): 30 mg via ORAL
  Filled 2023-07-02 (×3): qty 1

## 2023-07-02 MED ORDER — ORAL CARE MOUTH RINSE: 15.0000 mL | OROMUCOSAL | Status: DC | PRN

## 2023-07-02 MED ORDER — SODIUM CHLORIDE 0.9 % IV SOLN: 1.0000 g | INTRAVENOUS | Status: DC

## 2023-07-02 MED ORDER — ONDANSETRON HCL 4 MG PO TABS: 4.0000 mg | ORAL_TABLET | Freq: Four times a day (QID) | ORAL | Status: DC | PRN

## 2023-07-02 MED ORDER — OXYCODONE HCL 5 MG PO TABS
5.0000 mg | ORAL_TABLET | ORAL | Status: DC | PRN
  Administered 2023-07-02 (×2): 5 mg via ORAL
  Filled 2023-07-02 (×2): qty 1

## 2023-07-02 MED ORDER — ALBUTEROL SULFATE (2.5 MG/3ML) 0.083% IN NEBU: 2.5000 mg | INHALATION_SOLUTION | RESPIRATORY_TRACT | Status: DC | PRN

## 2023-07-02 MED ORDER — SODIUM CHLORIDE 0.9 % IV SOLN
2.0000 g | INTRAVENOUS | Status: DC
  Administered 2023-07-02 – 2023-07-04 (×2): 2 g via INTRAVENOUS
  Filled 2023-07-02 (×2): qty 20

## 2023-07-02 MED ORDER — PANTOPRAZOLE SODIUM 40 MG PO TBEC
40.0000 mg | DELAYED_RELEASE_TABLET | Freq: Every day | ORAL | Status: DC
  Administered 2023-07-02 – 2023-07-04 (×3): 40 mg via ORAL
  Filled 2023-07-02 (×3): qty 1

## 2023-07-02 MED ORDER — ONDANSETRON HCL 4 MG/2ML IJ SOLN: 4.0000 mg | Freq: Four times a day (QID) | INTRAMUSCULAR | Status: DC | PRN

## 2023-07-02 MED ORDER — APIXABAN 5 MG PO TABS
5.0000 mg | ORAL_TABLET | Freq: Two times a day (BID) | ORAL | Status: DC
  Administered 2023-07-02 – 2023-07-04 (×5): 5 mg via ORAL
  Filled 2023-07-02 (×5): qty 1

## 2023-07-02 MED ORDER — LEVOTHYROXINE SODIUM 137 MCG PO TABS: 137.0000 ug | ORAL_TABLET | Freq: Every day | ORAL | Status: DC

## 2023-07-02 NOTE — Assessment & Plan Note (Signed)
-   Continue Rocephin and Zithromax - Sputum culture - Strep and Legionella urine antigens - Patient has leukocytosis at 16.8, but does not otherwise meet SIRS criteria - Trend leukocytosis - Negative COVID - Chest x-ray shows patchy airspace disease in the right upper lobe consistent with pneumonia - Continue to monitor

## 2023-07-02 NOTE — H&P (Signed)
History and Physical    Patient: [name] ZDG:[id number] DOB: 24-Aug-1945 DOA: 07/01/2023 DOS: the patient was seen and examined on 07/02/2023 PCP: Benita Stabile, MD  Patient coming from: Home  Chief Complaint:  Chief Complaint  Patient presents with   Weakness   HPI: [name] is a 77 y.o. male with medical history significant of dementia, diabetes mellitus type 2, hyperlipidemia, hypertension, hypothyroidism, paroxysmal atrial tachycardia on Eliquis, and more presents to the ED with a chief complaint of generalized weakness.  Patient reports that he felt like he was dying.  It started a few days ago.  He had nausea, hurting in his abdomen that is chronic, pain in his back.  He reports he could not walk and he is normally able to ambulate.  He had nausea with 1 episode of vomiting.  There was no hematemesis.  Patient denies any diarrhea.  He has been having normal bowel movements.  He reports he sometimes has blood in his stool due to bleeding hemorrhoids, but nothing abnormal for him.  He reports he has had myalgias as well.  He has had a fever that finally let up yesterday.  He has had a dry cough.  He reports shortness of breath.  Patient reports that he does not wear oxygen at home.  He is feeling a little better since his treatment in the ED.  Patient has no other complaints at this time.  Patient does not smoke and does not drink.  Wife is at bedside and helps with the history given patient is hard of hearing and dementia. Review of Systems: As mentioned in the history of present illness. All other systems reviewed and are negative. Past Medical History:  Diagnosis Date   Alcohol use    Anxiety    Chest pain    Coronary artery disease    a. Nonobstructive CAD by cath 2010 with negative nuclear stress test 05/2013.   Depression    Diabetes mellitus    Hyperlipidemia    Hypertension    Hypothyroidism    Kidney problem    a. possible horseshoe kidney listed in  chart.   Mild aortic insufficiency    PAT (paroxysmal atrial tachycardia) (HCC)    Pericardial effusion    a. by echo 2014.   Pulmonary nodules    resolved   PVC's (premature ventricular contractions)    Stroke (cerebrum) (HCC)    Stroke due to embolism of left carotid artery (HCC) 03/24/2016   Left caudate head stroke   Wide-complex tachycardia    a. per [name] note: Event monitor September 2014 sinus rhythm, occasional PVCs, nonsustained PAT, and one 7 beat run of a wide QRS complex at 110-120 bpm, with minimal changes QRS axis, and QRS morphology not similar to documented PVCs.    Past Surgical History:  Procedure Laterality Date   BACK SURGERY  1996   BREAST LUMPECTOMY Left 1979   CIRCUMCISION  1980   COLONOSCOPY  10/18/2014   Dr. Teena Dunk, tubular adenoma in the ascending and distal transverse colon, hyperplastic polyp in sigmoid colon.  Recommended 3-5-year repeat.   COLONOSCOPY WITH PROPOFOL N/A 03/26/2022   Surgeon: Corbin Ade, MD; normal exam.  Repeat in 5 years due to personal history of polyps.   ESOPHAGOGASTRODUODENOSCOPY (EGD) WITH PROPOFOL N/A 03/26/2022   Surgeon: Corbin Ade, MD; Normal esophagus dilated, small hiatal hernia, normal stomach and examined duodenum.   EYE SURGERY     MALONEY DILATION N/A 03/26/2022  Procedure: MALONEY DILATION;  Surgeon: Corbin Ade, MD;  Location: [facility]O [facility]ITE;  Service: Endoscopy;  Laterality: N/A;   MANDIBLE SURGERY Right 1969   PACEMAKER IMPLANT N/A 06/26/2019   Procedure: PACEMAKER IMPLANT;  Surgeon: Hillis Range, MD;  Location: [name];  Service: Cardiovascular;  Laterality: N/A;   Social History:  reports that he quit smoking about 40 years ago. His smoking use included cigarettes. He has never used smokeless tobacco. He reports that he does not currently use alcohol. He reports that he does not currently use drugs after having used the following drugs: Marijuana.  Allergies  Allergen Reactions   Morphine  Other (See Comments)    Affected the heart- was told to not take this again   Tape Other (See Comments)    Certain tapes TEAR THE SKIN   Sulfonamide Derivatives Rash and Other (See Comments)    Blistering on the skin    Family History  Problem Relation Age of Onset   Lung cancer Mother    Diabetes Father    Bladder Cancer Father    Colon cancer Brother        Age 65, passed at age 72   Stroke Maternal Grandmother    Cancer Maternal Grandfather    Stroke Paternal Grandmother    Stroke Paternal Grandfather    Colon cancer Maternal Aunt        70s   Colon cancer Cousin     Prior to Admission medications   Medication Sig Start Date End Date Taking? Authorizing Provider  ACCU-CHEK GUIDE test strip USE TO TEST TWICE DAILY 08/24/21   [provider]  acetaminophen (TYLENOL) 500 MG tablet Take 500 mg by mouth every 8 (eight) hours as needed for moderate pain or headache.    [provider]  albuterol (VENTOLIN HFA) 108 (90 Base) MCG/ACT inhaler Inhale 1 puff into the lungs every 6 (six) hours as needed for wheezing.    [provider]  ALPRAZolam Prudy Feeler) 1 MG tablet Take 1 mg by mouth 3 (three) times daily. 01/18/16   [provider]  atorvastatin (LIPITOR) 40 MG tablet Take 40 mg by mouth daily. 10/20/21   [provider]  cyanocobalamin (VITAMIN B12) 1000 MCG/ML injection Inject 1 mL (1,000 mcg total) into the skin every 30 (thirty) days. Start after you finish weekly dosing 03/20/23   Tiffany Kocher, PA-C  DULoxetine (CYMBALTA) 30 MG capsule Take 30 mg by mouth daily. 04/06/22   [provider]  ELIQUIS 5 MG TABS tablet TAKE 1 TABLET BY MOUTH TWICE DAILY 05/25/23   Antoine Poche, MD  FARXIGA 10 MG TABS tablet Take 10 mg by mouth daily. 10/20/21   [provider]  gabapentin (NEURONTIN) 300 MG capsule Take 300 mg by mouth 3 (three) times daily. 12/17/17   [provider]  glimepiride (AMARYL) 2 MG tablet Take 2 mg by  mouth daily. 01/29/21   [provider]  isosorbide mononitrate (IMDUR) 30 MG 24 hr tablet TAKE 1 TABLET BY MOUTH EVERY DAY 06/27/23   Antoine Poche, MD  levothyroxine (SYNTHROID) 137 MCG tablet Take 137 mcg by mouth daily. 10/20/21   [provider]  lisinopril (ZESTRIL) 2.5 MG tablet TAKE 1 TABLET BY MOUTH DAILY 06/27/23   Antoine Poche, MD  nitroGLYCERIN (NITROSTAT) 0.4 MG SL tablet Place 0.4 mg under the tongue every 5 (five) minutes as needed for chest pain.  03/08/16   [provider]  omega-3 acid  ethyl esters (LOVAZA) 1 g capsule Take 2 g by mouth 2 (two) times daily. 12/17/17   [provider]  omeprazole (PRILOSEC) 40 MG capsule Take 40 mg by mouth daily before breakfast. 10/20/21   [provider]  oxyCODONE-acetaminophen (PERCOCET) 10-325 MG tablet Take 1 tablet by mouth every 4 (four) hours as needed. 01/25/23   [provider]  potassium chloride (MICRO-K) 10 MEQ CR capsule Take 10 mEq by mouth daily. 10/20/21   [provider]    Physical Exam: Vitals:   07/01/23 2000 07/01/23 2311 07/01/23 2315 07/02/23 0324  BP: 134/62     Pulse: 66 73 77   Resp: 11 18 18    Temp:   98.6 F (37 C) 97.9 F (36.6 C)  TempSrc:   Oral Oral  SpO2: 93% 96% 97%   Weight:      Height:       1.  General: Patient lying supine in bed,  no acute distress   2. Psychiatric: Alert and oriented x person and place, mood and behavior normal for situation, pleasant and cooperative with exam   3. Neurologic: Speech and language are normal, face is symmetric, moves all 4 extremities voluntarily, at baseline without acute deficits on limited exam   4. HEENMT:  Head is atraumatic, normocephalic, pupils reactive to light, neck is supple, trachea is midline, mucous membranes are moist   5. Respiratory : Lungs are diminished bilaterally without wheezing, rhonchi, rales, no cyanosis, no increase in work of breathing or accessory muscle use    6. Cardiovascular : Heart rate normal, rhythm is regular, no  rubs or gallops, no peripheral edema, peripheral pulses palpated   7. Gastrointestinal:  Abdomen is soft, nondistended, nontender to palpation bowel sounds active, no masses or organomegaly palpated   8. Skin:  Skin is warm, dry and intact without rashes, acute lesions, or ulcers on limited exam   9.Musculoskeletal:  No acute deformities or trauma, no asymmetry in tone, no peripheral edema, peripheral pulses palpated, no tenderness to palpation in the extremities  Data Reviewed: In the ED Afebrile in the ED, normal heart rate, and normal respiratory rate Blood pressure is normotensive 91-93% Leukocytosis at 16.8 AKI with a creatinine increased from 1.69-2.14 Albumin a little low at 3.3 negative COVID Chest x-ray shows patchy airspace disease with right upper lobe pneumonia Trope 7 Admission requested for community-acquired pneumonia  Assessment and Plan: * CAP (community acquired pneumonia) - Continue Rocephin and Zithromax - Sputum culture - Strep and Legionella urine antigens - Patient has leukocytosis at 16.8, but does not otherwise meet SIRS criteria - Trend leukocytosis - Negative COVID - Chest x-ray shows patchy airspace disease in the right upper lobe consistent with pneumonia - Continue to monitor  AKI (acute kidney injury) (HCC) - Creatinine increased from 1.69-2.14 - Likely decreased p.o. intake given that he has been ill - Patient was given a 1 L bolus in the ED - We will continue to encourage p.o. fluid intake - Continue to monitor  Gastroesophageal reflux disease - Continue PPI  Anxiety - Continue Xanax - Continue Cymbalta  Acquired hypothyroidism - Continue Synthroid  CAD (coronary artery disease) - Continue statin, Imdur - Holding ACE in the setting of AKI - No beta-blocker due to tachybradycardia syndrome - Continue to monitor  Essential hypertension - Holding lisinopril in the  setting of AKI  Mixed hyperlipidemia - Continue statin      Advance Care Planning:   Code Status: Full Code  Consults:  No consults at this time  Family Communication: Wife at bedside  Severity of Illness: The appropriate patient status for this patient is OBSERVATION. Observation status is judged to be reasonable and necessary in order to provide the required intensity of service to ensure the patient's safety. The patient's presenting symptoms, physical exam findings, and initial radiographic and laboratory data in the context of their medical condition is felt to place them at decreased risk for further clinical deterioration. Furthermore, it is anticipated that the patient will be medically stable for discharge from the hospital within 2 midnights of admission.   Author: Lilyan Gilford, DO 07/02/2023 5:25 AM  For on call review [email or web address].

## 2023-07-02 NOTE — ED Provider Notes (Signed)
Care assumed at shift change pending CT prior to admission for CAP and AKI. I personally viewed the images from radiology studies and agree with radiologist interpretation: CT is neg for acute process. Hospitalist is aware and will admit.    Pollyann Savoy, MD 07/02/23 [phone]

## 2023-07-02 NOTE — Assessment & Plan Note (Signed)
-  was Holding lisinopril in the setting of AKI -Improving kidney function -added Norvasc and resumed  

## 2023-07-02 NOTE — Progress Notes (Signed)
ASSUMPTION OF CARE NOTE   07/02/2023 4:09 PM  [name] was seen and examined.  The H&P by the admitting provider, orders, imaging was reviewed.  Please see new orders.  Will continue to follow.   Vitals:   07/02/23 0639 07/02/23 1419  BP: (!) 158/64 (!) 122/50  Pulse: 77 86  Resp:    Temp: 98.6 F (37 C) 98.5 F (36.9 C)  SpO2: 98% 92%    Results for orders placed or performed during the hospital encounter of 07/01/23  Resp panel by RT-PCR (RSV, Flu A&B, Covid) Anterior Nasal Swab   Specimen: Anterior Nasal Swab  Result Value Ref Range   SARS Coronavirus 2 by RT PCR NEGATIVE NEGATIVE   Influenza A by PCR NEGATIVE NEGATIVE   Influenza B by PCR NEGATIVE NEGATIVE   Resp Syncytial Virus by PCR NEGATIVE NEGATIVE  CBC with Differential  Result Value Ref Range   WBC 16.8 (H) 4.0 - 10.5 K/uL   RBC 4.53 4.22 - 5.81 MIL/uL   Hemoglobin 12.8 (L) 13.0 - 17.0 g/dL   HCT 95.6 21.3 - 08.6 %   MCV 89.8 80.0 - 100.0 fL   MCH 28.3 26.0 - 34.0 pg   MCHC 31.4 30.0 - 36.0 g/dL   RDW 57.8 46.9 - 62.9 %   Platelets 132 (L) 150 - 400 K/uL   nRBC 0.0 0.0 - 0.2 %   Neutrophils Relative % 81 %   Neutro Abs 13.5 (H) 1.7 - 7.7 K/uL   Lymphocytes Relative 10 %   Lymphs Abs 1.7 0.7 - 4.0 K/uL   Monocytes Relative 8 %   Monocytes Absolute 1.4 (H) 0.1 - 1.0 K/uL   Eosinophils Relative 1 %   Eosinophils Absolute 0.1 0.0 - 0.5 K/uL   Basophils Relative 0 %   Basophils Absolute 0.0 0.0 - 0.1 K/uL   Immature Granulocytes 0 %   Abs Immature Granulocytes 0.07 0.00 - 0.07 K/uL  Comprehensive metabolic panel  Result Value Ref Range   Sodium 133 (L) 135 - 145 mmol/L   Potassium 3.9 3.5 - 5.1 mmol/L   Chloride 106 98 - 111 mmol/L   CO2 20 (L) 22 - 32 mmol/L   Glucose, Bld 179 (H) 70 - 99 mg/dL   BUN 19 8 - 23 mg/dL   Creatinine, Ser 5.28 (H) 0.61 - 1.24 mg/dL   Calcium 8.5 (L) 8.9 - 10.3 mg/dL   Total Protein 6.5 6.5 - 8.1 g/dL   Albumin 3.3 (L) 3.5 - 5.0 g/dL   AST 12 (L) 15 - 41 U/L    ALT 14 0 - 44 U/L   Alkaline Phosphatase 78 38 - 126 U/L   Total Bilirubin 1.1 <1.2 mg/dL   GFR, Estimated 31 (L) >60 mL/min   Anion gap 7 5 - 15  Lipase, blood  Result Value Ref Range   Lipase 23 11 - 51 U/L  Urinalysis, Routine w reflex microscopic -Urine, Clean Catch  Result Value Ref Range   Color, Urine YELLOW YELLOW   APPearance CLEAR CLEAR   Specific Gravity, Urine 1.024 1.005 - 1.030   pH 5.0 5.0 - 8.0   Glucose, UA >=500 (A) NEGATIVE mg/dL   Hgb urine dipstick NEGATIVE NEGATIVE   Bilirubin Urine NEGATIVE NEGATIVE   Ketones, ur NEGATIVE NEGATIVE mg/dL   Protein, ur NEGATIVE NEGATIVE mg/dL   Nitrite NEGATIVE NEGATIVE   Leukocytes,Ua NEGATIVE NEGATIVE   RBC / HPF 0-5 0 - 5 RBC/hpf   WBC, UA 0-5 0 -  5 WBC/hpf   Bacteria, UA NONE SEEN NONE SEEN   Squamous Epithelial / HPF 0-5 0 - 5 /HPF   Mucus PRESENT    Hyaline Casts, UA PRESENT   Brain natriuretic peptide  Result Value Ref Range   B Natriuretic Peptide 196.0 (H) 0.0 - 100.0 pg/mL  Comprehensive metabolic panel  Result Value Ref Range   Sodium 135 135 - 145 mmol/L   Potassium 4.5 3.5 - 5.1 mmol/L   Chloride 105 98 - 111 mmol/L   CO2 22 22 - 32 mmol/L   Glucose, Bld 136 (H) 70 - 99 mg/dL   BUN 18 8 - 23 mg/dL   Creatinine, Ser 5.40 (H) 0.61 - 1.24 mg/dL   Calcium 8.4 (L) 8.9 - 10.3 mg/dL   Total Protein 6.4 (L) 6.5 - 8.1 g/dL   Albumin 3.2 (L) 3.5 - 5.0 g/dL   AST 11 (L) 15 - 41 U/L   ALT 14 0 - 44 U/L   Alkaline Phosphatase 78 38 - 126 U/L   Total Bilirubin 0.6 <1.2 mg/dL   GFR, Estimated 36 (L) >60 mL/min   Anion gap 8 5 - 15  Magnesium  Result Value Ref Range   Magnesium 2.1 1.7 - 2.4 mg/dL  TSH  Result Value Ref Range   TSH 0.351 0.350 - 4.500 uIU/mL  CBC with Differential/Platelet  Result Value Ref Range   WBC 14.0 (H) 4.0 - 10.5 K/uL   RBC 4.50 4.22 - 5.81 MIL/uL   Hemoglobin 12.6 (L) 13.0 - 17.0 g/dL   HCT 98.1 19.1 - 47.8 %   MCV 90.7 80.0 - 100.0 fL   MCH 28.0 26.0 - 34.0 pg   MCHC 30.9 30.0  - 36.0 g/dL   RDW 29.5 62.1 - 30.8 %   Platelets 126 (L) 150 - 400 K/uL   nRBC 0.0 0.0 - 0.2 %   Neutrophils Relative % 82 %   Neutro Abs 11.4 (H) 1.7 - 7.7 K/uL   Lymphocytes Relative 10 %   Lymphs Abs 1.5 0.7 - 4.0 K/uL   Monocytes Relative 7 %   Monocytes Absolute 1.0 0.1 - 1.0 K/uL   Eosinophils Relative 1 %   Eosinophils Absolute 0.1 0.0 - 0.5 K/uL   Basophils Relative 0 %   Basophils Absolute 0.0 0.0 - 0.1 K/uL   Immature Granulocytes 0 %   Abs Immature Granulocytes 0.05 0.00 - 0.07 K/uL  Troponin I (High Sensitivity)  Result Value Ref Range   Troponin I (High Sensitivity) 7 <18 ng/L  Troponin I (High Sensitivity)  Result Value Ref Range   Troponin I (High Sensitivity) 7 <18 ng/L     C. Laural Benes, MD [facility]   07/01/2023  5:51 PM How to contact the [facility] Attending or Consulting provider 7A - 7P or covering provider during after hours 7P -7A, for this patient?  Check the care team in [facility] and look for a) attending/consulting [facility] provider listed and b) the [facility] team listed Log into [email or web address] and use [place]'s universal password to access. If you do not have the password, please contact the hospital operator. Locate the [facility] provider you are looking for under [facility] and page to a number that you can be directly reached. If you still have difficulty reaching the provider, please page the [facility] (Director on Call) for the Hospitalists listed on amion for assistance.

## 2023-07-02 NOTE — Assessment & Plan Note (Signed)
Continue Synthroid °

## 2023-07-02 NOTE — Assessment & Plan Note (Signed)
Continue PPI ?

## 2023-07-02 NOTE — Assessment & Plan Note (Signed)
-   Continue statin, Imdur - Holding ACE in the setting of AKI - No beta-blocker due to tachybradycardia syndrome - Continue to monitor

## 2023-07-02 NOTE — ED Notes (Signed)
Provider at bedside

## 2023-07-02 NOTE — Progress Notes (Signed)
   07/02/23 1415  TOC Brief Assessment  Insurance and Status Reviewed  Patient has primary care physician Yes  Home environment has been reviewed From home with spouse  Prior level of function: Independent  Prior/Current Home Services No current home services  Social Determinants of Health Reivew SDOH reviewed no interventions necessary  Readmission risk has been reviewed Yes  Transition of care needs no transition of care needs at this time

## 2023-07-02 NOTE — Assessment & Plan Note (Signed)
-   Continue Xanax - Continue Cymbalta

## 2023-07-02 NOTE — Assessment & Plan Note (Signed)
Continue statin. 

## 2023-07-02 NOTE — ED Notes (Signed)
Pt lying in bed with family at bedside. Pt lying with no apparent distress. Pt stated I am just ready to go home. Still feeling weak.

## 2023-07-02 NOTE — Assessment & Plan Note (Signed)
-   Creatinine increased from 1.69-2.14 - Likely decreased p.o. intake given that he has been ill - Patient was given a 1 L bolus in the ED - We will continue to encourage p.o. fluid intake - Continue to monitor

## 2023-07-03 DIAGNOSIS — Z7901 Long term (current) use of anticoagulants: Secondary | ICD-10-CM | POA: Diagnosis not present

## 2023-07-03 DIAGNOSIS — E1122 Type 2 diabetes mellitus with diabetic chronic kidney disease: Secondary | ICD-10-CM | POA: Diagnosis present

## 2023-07-03 DIAGNOSIS — F32A Depression, unspecified: Secondary | ICD-10-CM | POA: Diagnosis present

## 2023-07-03 DIAGNOSIS — F0394 Unspecified dementia, unspecified severity, with anxiety: Secondary | ICD-10-CM | POA: Diagnosis present

## 2023-07-03 DIAGNOSIS — I10 Essential (primary) hypertension: Secondary | ICD-10-CM | POA: Diagnosis not present

## 2023-07-03 DIAGNOSIS — I2510 Atherosclerotic heart disease of native coronary artery without angina pectoris: Secondary | ICD-10-CM | POA: Diagnosis present

## 2023-07-03 DIAGNOSIS — F0392 Unspecified dementia, unspecified severity, with psychotic disturbance: Secondary | ICD-10-CM | POA: Diagnosis present

## 2023-07-03 DIAGNOSIS — Z1152 Encounter for screening for COVID-19: Secondary | ICD-10-CM | POA: Diagnosis not present

## 2023-07-03 DIAGNOSIS — E039 Hypothyroidism, unspecified: Secondary | ICD-10-CM | POA: Diagnosis present

## 2023-07-03 DIAGNOSIS — F1320 Sedative, hypnotic or anxiolytic dependence, uncomplicated: Secondary | ICD-10-CM | POA: Diagnosis present

## 2023-07-03 DIAGNOSIS — Z79890 Hormone replacement therapy (postmenopausal): Secondary | ICD-10-CM | POA: Diagnosis not present

## 2023-07-03 DIAGNOSIS — Z87891 Personal history of nicotine dependence: Secondary | ICD-10-CM | POA: Diagnosis not present

## 2023-07-03 DIAGNOSIS — I129 Hypertensive chronic kidney disease with stage 1 through stage 4 chronic kidney disease, or unspecified chronic kidney disease: Secondary | ICD-10-CM | POA: Diagnosis present

## 2023-07-03 DIAGNOSIS — N179 Acute kidney failure, unspecified: Secondary | ICD-10-CM

## 2023-07-03 DIAGNOSIS — J189 Pneumonia, unspecified organism: Secondary | ICD-10-CM | POA: Diagnosis present

## 2023-07-03 DIAGNOSIS — E782 Mixed hyperlipidemia: Secondary | ICD-10-CM | POA: Diagnosis present

## 2023-07-03 DIAGNOSIS — Z800 Family history of malignant neoplasm of digestive organs: Secondary | ICD-10-CM | POA: Diagnosis not present

## 2023-07-03 DIAGNOSIS — G8929 Other chronic pain: Secondary | ICD-10-CM | POA: Diagnosis present

## 2023-07-03 DIAGNOSIS — Z7984 Long term (current) use of oral hypoglycemic drugs: Secondary | ICD-10-CM | POA: Diagnosis not present

## 2023-07-03 DIAGNOSIS — I2583 Coronary atherosclerosis due to lipid rich plaque: Secondary | ICD-10-CM

## 2023-07-03 DIAGNOSIS — I495 Sick sinus syndrome: Secondary | ICD-10-CM | POA: Diagnosis present

## 2023-07-03 DIAGNOSIS — Z801 Family history of malignant neoplasm of trachea, bronchus and lung: Secondary | ICD-10-CM | POA: Diagnosis not present

## 2023-07-03 DIAGNOSIS — N1832 Chronic kidney disease, stage 3b: Secondary | ICD-10-CM | POA: Diagnosis present

## 2023-07-03 DIAGNOSIS — I480 Paroxysmal atrial fibrillation: Secondary | ICD-10-CM | POA: Diagnosis present

## 2023-07-03 DIAGNOSIS — K219 Gastro-esophageal reflux disease without esophagitis: Secondary | ICD-10-CM | POA: Diagnosis present

## 2023-07-03 DIAGNOSIS — K649 Unspecified hemorrhoids: Secondary | ICD-10-CM | POA: Diagnosis present

## 2023-07-03 LAB — CBC WITH DIFFERENTIAL/PLATELET
Abs Immature Granulocytes: 0.03 10*3/uL (ref 0.00–0.07)
Basophils Absolute: 0 10*3/uL (ref 0.0–0.1)
Basophils Relative: 0 %
Eosinophils Absolute: 0.1 10*3/uL (ref 0.0–0.5)
Eosinophils Relative: 2 %
HCT: 35.6 % — ABNORMAL LOW (ref 39.0–52.0)
Hemoglobin: 11.2 g/dL — ABNORMAL LOW (ref 13.0–17.0)
Immature Granulocytes: 0 %
Lymphocytes Relative: 12 %
Lymphs Abs: 1 10*3/uL (ref 0.7–4.0)
MCH: 28.4 pg (ref 26.0–34.0)
MCHC: 31.5 g/dL (ref 30.0–36.0)
MCV: 90.1 fL (ref 80.0–100.0)
Monocytes Absolute: 0.9 10*3/uL (ref 0.1–1.0)
Monocytes Relative: 10 %
Neutro Abs: 6.8 10*3/uL (ref 1.7–7.7)
Neutrophils Relative %: 76 %
Platelets: 108 10*3/uL — ABNORMAL LOW (ref 150–400)
RBC: 3.95 MIL/uL — ABNORMAL LOW (ref 4.22–5.81)
RDW: 14.4 % (ref 11.5–15.5)
WBC: 8.9 10*3/uL (ref 4.0–10.5)
nRBC: 0 % (ref 0.0–0.2)

## 2023-07-03 LAB — BASIC METABOLIC PANEL
Anion gap: 7 (ref 5–15)
BUN: 17 mg/dL (ref 8–23)
CO2: 22 mmol/L (ref 22–32)
Calcium: 8.3 mg/dL — ABNORMAL LOW (ref 8.9–10.3)
Chloride: 106 mmol/L (ref 98–111)
Creatinine, Ser: 1.65 mg/dL — ABNORMAL HIGH (ref 0.61–1.24)
GFR, Estimated: 43 mL/min — ABNORMAL LOW (ref >60)
Glucose, Bld: 86 mg/dL (ref 70–99)
Potassium: 4 mmol/L (ref 3.5–5.1)
Sodium: 135 mmol/L (ref 135–145)

## 2023-07-03 LAB — MRSA NEXT GEN BY PCR, NASAL: MRSA by PCR Next Gen: NOT DETECTED

## 2023-07-03 MED ORDER — UMECLIDINIUM BROMIDE 62.5 MCG/ACT IN AEPB
1.0000 | INHALATION_SPRAY | Freq: Every day | RESPIRATORY_TRACT | Status: DC
  Administered 2023-07-03 – 2023-07-04 (×2): 1 via RESPIRATORY_TRACT
  Filled 2023-07-03: qty 7

## 2023-07-03 MED ORDER — FLUTICASONE FUROATE-VILANTEROL 100-25 MCG/ACT IN AEPB
1.0000 | INHALATION_SPRAY | Freq: Every day | RESPIRATORY_TRACT | Status: DC
  Administered 2023-07-03 – 2023-07-04 (×2): 1 via RESPIRATORY_TRACT
  Filled 2023-07-03: qty 28

## 2023-07-03 MED ORDER — GABAPENTIN 100 MG PO CAPS: 200.0000 mg | ORAL_CAPSULE | Freq: Every evening | ORAL | Status: DC | PRN

## 2023-07-03 MED ORDER — ALPRAZOLAM 0.5 MG PO TABS
0.5000 mg | ORAL_TABLET | Freq: Every day | ORAL | Status: DC
Start: 2023-07-04 — End: 2023-07-04

## 2023-07-03 NOTE — Plan of Care (Signed)
  Problem: Activity: Goal: Ability to tolerate increased activity will improve Outcome: Progressing   Problem: Clinical Measurements: Goal: Ability to maintain a body temperature in the normal range will improve Outcome: Progressing   

## 2023-07-03 NOTE — Plan of Care (Signed)
  Problem: Education: Goal: Knowledge of General Education information will improve Description: Including pain rating scale, medication(s)/side effects and non-pharmacologic comfort measures Outcome: Progressing   Problem: Coping: Goal: Level of anxiety will decrease Outcome: Progressing   Problem: Skin Integrity: Goal: Risk for impaired skin integrity will decrease Outcome: Progressing   

## 2023-07-03 NOTE — TOC Progression Note (Signed)
Transition of Care [facility]) - Progression Note    Patient Details  Name: [name] MRN: [id number] Date of Birth: 05-29-1946  Transition of Care [facility]) CM/SW Contact  Catalina Gravel, [place] Phone Number: 07/03/2023, 1:34 PM  Clinical Narrative:    CSW delivered MOON.  Spouse was at bedside, she stated pt declines HHPT, when shared the recommendation. TOC to continue to follow.      Barriers to Discharge: Continued Medical Work up  Expected Discharge Plan and Services                                               Social Determinants of Health (SDOH) Interventions SDOH Screenings   Food Insecurity: No Food Insecurity (07/02/2023)  Housing: Low Risk  (07/02/2023)  Transportation Needs: No Transportation Needs (07/02/2023)  Utilities: Not At Risk (07/02/2023)  Depression (PHQ2-9): Low Risk  (12/11/2019)  Tobacco Use: Medium Risk (07/01/2023)    Readmission Risk Interventions     No data to display

## 2023-07-03 NOTE — Plan of Care (Signed)
  Problem: Acute Rehab PT Goals(only PT should resolve) Goal: Pt Will Go Supine/Side To Sit Outcome: Progressing Flowsheets (Taken 07/03/2023 1200) Pt will go Supine/Side to Sit:  with modified independence  with supervision Goal: Patient Will Transfer Sit To/From Stand Outcome: Progressing Flowsheets (Taken 07/03/2023 1200) Patient will transfer sit to/from stand:  with supervision  with contact guard assist Goal: Pt Will Transfer Bed To Chair/Chair To Bed Outcome: Progressing Flowsheets (Taken 07/03/2023 1200) Pt will Transfer Bed to Chair/Chair to Bed:  with contact guard assist  with supervision Goal: Pt Will Ambulate Outcome: Progressing Flowsheets (Taken 07/03/2023 1200) Pt will Ambulate:  25 feet  with contact guard assist  with minimal assist  with rolling walker   12:01 PM, 07/03/23 Ocie Bob, MPT Physical Therapist with [facility] [phone] office [phone] mobile phone

## 2023-07-03 NOTE — Care Management Obs Status (Signed)
MEDICARE OBSERVATION STATUS NOTIFICATION   Patient Details  Name: [name] MRN: [id number] Date of Birth: [birth year]   Medicare Observation Status Notification Given:  Yes    Ozell Juhasz Marsh Dolly, LCSW 07/03/2023, 1:33 PM

## 2023-07-03 NOTE — Evaluation (Signed)
Physical Therapy Evaluation Patient Details Name: [name] MRN: [id number] DOB: Sep 11, 1945 Today's Date: 07/03/2023  History of Present Illness  [name] is a 77 y.o. male with medical history significant of dementia, diabetes mellitus type 2, hyperlipidemia, hypertension, hypothyroidism, paroxysmal atrial tachycardia on Eliquis, and more presents to the ED with a chief complaint of generalized weakness.  Patient reports that he felt like he was dying.  It started a few days ago.  He had nausea, hurting in his abdomen that is chronic, pain in his back.  He reports he could not walk and he is normally able to ambulate.  He had nausea with 1 episode of vomiting.  There was no hematemesis.  Patient denies any diarrhea.  He has been having normal bowel movements.  He reports he sometimes has blood in his stool due to bleeding hemorrhoids, but nothing abnormal for him.  He reports he has had myalgias as well.  He has had a fever that finally let up yesterday.  He has had a dry cough.  He reports shortness of breath.  Patient reports that he does not wear oxygen at home.  He is feeling a little better since his treatment in the ED.  Patient has no other complaints at this time.   Clinical Impression  Patient demonstrates slightly labored movement for sitting up at bedside, poor balance using SPC for taking steps/transfers, safer using RW and limited to a few steps in room before requesting to sit due to c/o fatigue.  Patient on room air with SpO2 dropping from 91% to 88% during activities and tolerance sitting up in chair with SpO2 at 89% after therapy, spouse present in room - nurse notified.  Patient will benefit from continued skilled physical therapy in hospital and recommended venue below to increase strength, balance, endurance for safe ADLs and gait.         If plan is discharge home, recommend the following: A lot of help with walking and/or transfers;A little help with  bathing/dressing/bathroom;Help with stairs or ramp for entrance;Assistance with cooking/housework   Can travel by private vehicle        Equipment Recommendations None recommended by PT  Recommendations for Other Services       Functional Status Assessment Patient has had a recent decline in their functional status and demonstrates the ability to make significant improvements in function in a reasonable and predictable amount of time.     Precautions / Restrictions Precautions Precautions: Fall Restrictions Weight Bearing Restrictions: No      Mobility  Bed Mobility Overal bed mobility: Needs Assistance Bed Mobility: Supine to Sit     Supine to sit: Contact guard     General bed mobility comments: increased time, labored movement    Transfers Overall transfer level: Needs assistance Equipment used: Rolling walker (2 wheels), Straight cane Transfers: Sit to/from Stand, Bed to chair/wheelchair/BSC Sit to Stand: Min assist   Step pivot transfers: Min assist       General transfer comment: unsteady labored movement with fair/poor balance using SPC, safer using RW    Ambulation/Gait Ambulation/Gait assistance: Min assist, Mod assist Gait Distance (Feet): 15 Feet Assistive device: Rolling walker (2 wheels) Gait Pattern/deviations: Decreased step length - right, Decreased step length - left, Decreased stride length, Trunk flexed Gait velocity: decreased     General Gait Details: slow labored cadence and limited to a few steps in room before having to sit due to c/o fatigue, on room air with SpO2  dropping from 91% to 88%  Stairs            Wheelchair Mobility     Tilt Bed    Modified Rankin (Stroke Patients Only)       Balance Overall balance assessment: Needs assistance Sitting-balance support: Feet supported, No upper extremity supported Sitting balance-Leahy Scale: Fair Sitting balance - Comments: fair/good seated at EOB   Standing balance  support: Reliant on assistive device for balance, During functional activity, Bilateral upper extremity supported Standing balance-Leahy Scale: Fair Standing balance comment: fair/poor using SPC, fair using RW                             Pertinent Vitals/Pain Pain Assessment Pain Assessment: No/denies pain    Home Living Family/patient expects to be discharged to:: Private residence Living Arrangements: Spouse/significant other Available Help at Discharge: Family;Available 24 hours/day Type of Home: House Home Access: Stairs to enter Entrance Stairs-Rails: None Entrance Stairs-Number of Steps: 3   Home Layout: One level Home Equipment: Agricultural consultant (2 wheels);Cane - single point;Wheelchair - manual      Prior Function Prior Level of Function : Needs assist       Physical Assist : Mobility (physical);ADLs (physical) Mobility (physical): Bed mobility;Transfers;Gait;Stairs   Mobility Comments: household and short distanced community ambulator using [facility] ADLs Comments: Assisted by family     Extremity/Trunk Assessment   Upper Extremity Assessment Upper Extremity Assessment: Generalized weakness    Lower Extremity Assessment Lower Extremity Assessment: Generalized weakness    Cervical / Trunk Assessment Cervical / Trunk Assessment: Kyphotic  Communication   Communication Communication: Hearing impairment Cueing Techniques: Verbal cues;Tactile cues  Cognition Arousal: Alert Behavior During Therapy: WFL for tasks assessed/performed Overall Cognitive Status: History of cognitive impairments - at baseline                                          General Comments      Exercises     Assessment/Plan    PT Assessment Patient needs continued PT services  PT Problem List Decreased strength;Decreased activity tolerance;Decreased balance;Decreased mobility       PT Treatment Interventions DME instruction;Gait training;Stair  training;Functional mobility training;Therapeutic activities;Therapeutic exercise;Balance training;Patient/family education    PT Goals (Current goals can be found in the Care Plan section)  Acute Rehab PT Goals Patient Stated Goal: return home with family to assist PT Goal Formulation: With patient/family Time For Goal Achievement: 07/07/23 Potential to Achieve Goals: Good    Frequency Min 3X/week     Co-evaluation               AM-PAC PT "6 Clicks" Mobility  Outcome Measure Help needed turning from your back to your side while in a flat bed without using bedrails?: None Help needed moving from lying on your back to sitting on the side of a flat bed without using bedrails?: A Little Help needed moving to and from a bed to a chair (including a wheelchair)?: A Little Help needed standing up from a chair using your arms (e.g., wheelchair or bedside chair)?: A Little Help needed to walk in hospital room?: A Lot Help needed climbing 3-5 steps with a railing? : A Lot 6 Click Score: 17    End of Session   Activity Tolerance: Patient tolerated treatment well;Patient limited by fatigue Patient left: in  chair;with call bell/phone within reach Nurse Communication: Mobility status PT Visit Diagnosis: Unsteadiness on feet (R26.81);Other abnormalities of gait and mobility (R26.89)    Time: 1308-6578 PT Time Calculation (min) (ACUTE ONLY): 21 min   Charges:   PT Evaluation $PT Eval Moderate Complexity: 1 Mod PT Treatments $Therapeutic Activity: 8-22 mins PT General Charges $$ ACUTE PT VISIT: 1 Visit         12:00 PM, 07/03/23 Ocie Bob, MPT Physical Therapist with [facility] [phone] office [phone] mobile phone

## 2023-07-03 NOTE — Progress Notes (Signed)
PROGRESS NOTE   [name]  ZOX:[id number] DOB: 19-Nov-1945 DOA: 07/01/2023 PCP: Benita Stabile, MD   Chief Complaint  Patient presents with   Weakness   Level of care: Med-Surg  Brief Admission History:  77 y.o. male with medical history significant of dementia, diabetes mellitus type 2, hyperlipidemia, hypertension, hypothyroidism, paroxysmal atrial tachycardia on Eliquis, and more presents to the ED with a chief complaint of generalized weakness.  Patient reports that he felt like he was dying.  It started a few days ago.  He had nausea, hurting in his abdomen that is chronic, pain in his back.  He reports he could not walk and he is normally able to ambulate.  He had nausea with 1 episode of vomiting.  There was no hematemesis.  Patient denies any diarrhea.  He has been having normal bowel movements.  He reports he sometimes has blood in his stool due to bleeding hemorrhoids, but nothing abnormal for him.  He reports he has had myalgias as well.  He has had a fever that finally let up yesterday.  He has had a dry cough.  He reports shortness of breath.  Patient reports that he does not wear oxygen at home.  He is feeling a little better since his treatment in the ED.  Patient has no other complaints at this time.   Patient does not smoke and does not drink.   Wife is at bedside and helps with the history given patient is hard of hearing and dementia.   Assessment and Plan:  CAP (community acquired pneumonia) - Continue Rocephin and Zithromax - Sputum culture pending - Strep and Legionella urine antigens - Patient has leukocytosis at 16.8, but does not otherwise meet SIRS criteria - WBC trending down  - Negative COVID - Chest x-ray shows patchy airspace disease in the right upper lobe consistent with pneumonia - Continue to monitor  AKI (acute kidney injury) - RESOLVED  - Creatinine increased from 1.69-2.14 - Likely decreased p.o. intake given that he has been ill - Patient  was given a 1 L bolus in the ED - We will continue to encourage p.o. fluid intake - Continue to monitor  Gastroesophageal reflux disease - Continue PPI  Anxiety with benzodiazepine dependence - Continue Xanax - Continue Cymbalta  Acquired hypothyroidism - Continue Synthroid  CAD (coronary artery disease) - Continue statin, Imdur - Holding ACE in the setting of AKI - No beta-blocker due to tachybradycardia syndrome - Continue to monitor  Essential hypertension - Holding lisinopril in the setting of AKI  Mixed hyperlipidemia - Continue statin  DVT prophylaxis: apixaban Code Status: Full  Family Communication: bedside update  Disposition: anticipate DC home 11/11   Consultants:   Procedures:   Antimicrobials:  CTX 11/8>> AZTH 11/8>>   Subjective: Pt says he feels terrible, he was hallucinating overnight Objective: Vitals:   07/02/23 0639 07/02/23 1419 07/02/23 1947 07/03/23 0520  BP: (!) 158/64 (!) 122/50 103/67 (!) 123/54  Pulse: 77 86 72 75  Resp:   20 20  Temp: 98.6 F (37 C) 98.5 F (36.9 C) 97.8 F (36.6 C) 97.9 F (36.6 C)  TempSrc: Oral Oral Oral Oral  SpO2: 98% 92% 97% 94%  Weight:      Height:        Intake/Output Summary (Last 24 hours) at 07/03/2023 1239 Last data filed at 07/03/2023 0541 Gross per 24 hour  Intake 829.24 ml  Output --  Net 829.24 ml   [employer]  07/01/23 1748  Weight: 86.2 kg   Examination:  General exam: Appears calm and comfortable  Respiratory system: Clear to auscultation. Respiratory effort normal. Cardiovascular system: normal S1 & S2 heard. No JVD, murmurs, rubs, gallops or clicks. No pedal edema. Gastrointestinal system: Abdomen is nondistended, soft and nontender. No organomegaly or masses felt. Normal bowel sounds heard. Central nervous system: Alert and oriented. No focal neurological deficits. Extremities: Symmetric 5 x 5 power. Skin: No rashes, lesions or ulcers. Psychiatry: Judgement and insight  appear normal. Mood & affect appropriate.   Data Reviewed: I have personally reviewed following labs and imaging studies  CBC: Recent Labs  Lab 07/01/23 1828 07/02/23 0615 07/03/23 0429  WBC 16.8* 14.0* 8.9  NEUTROABS 13.5* 11.4* 6.8  HGB 12.8* 12.6* 11.2*  HCT 40.7 40.8 35.6*  MCV 89.8 90.7 90.1  PLT 132* 126* 108*    Basic Metabolic Panel: Recent Labs  Lab 07/01/23 1828 07/02/23 0445 07/03/23 0429  NA 133* 135 135  K 3.9 4.5 4.0  CL 106 105 106  CO2 20* 22 22  GLUCOSE 179* 136* 86  BUN 19 18 17   CREATININE 2.14* 1.90* 1.65*  CALCIUM 8.5* 8.4* 8.3*  MG  --  2.1  --     CBG: No results for input(s): "GLUCAP" in the last 168 hours.  Recent Results (from the past 240 hour(s))  Resp panel by RT-PCR (RSV, Flu A&B, Covid) Anterior Nasal Swab     Status: None   Collection Time: 07/01/23  7:22 PM   Specimen: Anterior Nasal Swab  Result Value Ref Range Status   SARS Coronavirus 2 by RT PCR NEGATIVE NEGATIVE Final    Comment: (NOTE) SARS-CoV-2 target nucleic acids are NOT DETECTED.  The SARS-CoV-2 RNA is generally detectable in upper respiratory specimens during the acute phase of infection. The lowest concentration of SARS-CoV-2 viral copies this assay can detect is 138 copies/mL. A negative result does not preclude SARS-Cov-2 infection and should not be used as the sole basis for treatment or other patient management decisions. A negative result may occur with  improper specimen collection/handling, submission of specimen other than nasopharyngeal swab, presence of viral mutation(s) within the areas targeted by this assay, and inadequate number of viral copies(<138 copies/mL). A negative result must be combined with clinical observations, patient history, and epidemiological information. The expected result is Negative.  Fact Sheet for Patients:  BloggerCourse.com  Fact Sheet for Healthcare Providers:   SeriousBroker.it  This test is no t yet approved or cleared by the [place] FDA and  has been authorized for detection and/or diagnosis of SARS-CoV-2 by FDA under an Emergency Use Authorization (EUA). This EUA will remain  in effect (meaning this test can be used) for the duration of the COVID-19 declaration under Section 564(b)(1) of the Act, 21 U.S.C.section 360bbb-3(b)(1), unless the authorization is terminated  or revoked sooner.       Influenza A by PCR NEGATIVE NEGATIVE Final   Influenza B by PCR NEGATIVE NEGATIVE Final    Comment: (NOTE) The Xpert Xpress SARS-CoV-2/FLU/RSV plus assay is intended as an aid in the diagnosis of influenza from Nasopharyngeal swab specimens and should not be used as a sole basis for treatment. Nasal washings and aspirates are unacceptable for Xpert Xpress SARS-CoV-2/FLU/RSV testing.  Fact Sheet for Patients: BloggerCourse.com  Fact Sheet for Healthcare Providers: SeriousBroker.it  This test is not yet approved or cleared by the [place] FDA and has been authorized for detection and/or diagnosis of SARS-CoV-2 by FDA  under an Emergency Use Authorization (EUA). This EUA will remain in effect (meaning this test can be used) for the duration of the COVID-19 declaration under Section 564(b)(1) of the Act, 21 U.S.C. section 360bbb-3(b)(1), unless the authorization is terminated or revoked.     Resp Syncytial Virus by PCR NEGATIVE NEGATIVE Final    Comment: (NOTE) Fact Sheet for Patients: BloggerCourse.com  Fact Sheet for Healthcare Providers: SeriousBroker.it  This test is not yet approved or cleared by the [place] FDA and has been authorized for detection and/or diagnosis of SARS-CoV-2 by FDA under an Emergency Use Authorization (EUA). This EUA will remain in effect (meaning this test can be used) for  the duration of the COVID-19 declaration under Section 564(b)(1) of the Act, 21 U.S.C. section 360bbb-3(b)(1), unless the authorization is terminated or revoked.  Performed at [facility] [address], [place], [place] [address]      Radiology Studies: CT ABDOMEN PELVIS W CONTRAST  Result Date: 07/02/2023 CLINICAL DATA:  Weakness, general abdominal pain, received COVID vaccine 3 days ago. Tired and sleeping since. EXAM: CT ABDOMEN AND PELVIS WITH CONTRAST TECHNIQUE: Multidetector CT imaging of the abdomen and pelvis was performed using the standard protocol following bolus administration of intravenous contrast. RADIATION DOSE REDUCTION: This exam was performed according to the departmental dose-optimization program which includes automated exposure control, adjustment of the mA and/or kV according to patient size and/or use of iterative reconstruction technique. CONTRAST:  80mL OMNIPAQUE IOHEXOL 300 MG/ML  SOLN COMPARISON:  02/09/2022 FINDINGS: Lower chest: No acute abnormality. Hepatobiliary: Hepatic steatosis. No focal liver abnormality is seen. No gallstones, gallbladder wall thickening, or biliary dilatation. Pancreas: Unremarkable. Spleen: Unremarkable. Adrenals/Urinary Tract: Stable adrenal glands. Mild rotated right kidney. Nonobstructing tiny right nephrolithiasis. No obstructing ureteral calculi or hydronephrosis. Unremarkable bladder. Stomach/Bowel: Normal caliber large and small bowel without bowel wall thickening. The appendix and stomach are within normal limits. Vascular/Lymphatic: Aortic atherosclerosis. No enlarged abdominal or pelvic lymph nodes. Reproductive: Prostate is unremarkable. Other: No free intraperitoneal fluid or air. Musculoskeletal: No acute fracture. IMPRESSION: 1. No acute abnormality in the abdomen or pelvis. 2. Nonobstructing right nephrolithiasis. Hepatic steatosis. Aortic Atherosclerosis (ICD10-I70.0). Electronically Signed   By: Minerva Fester M.D.   On:  07/02/2023 00:16   DG Chest Port 1 View  Result Date: 07/01/2023 CLINICAL DATA:  Shortness of breath EXAM: PORTABLE CHEST 1 VIEW COMPARISON:  Chest x-ray 10/28/2021. FINDINGS: There is patchy airspace disease in the right upper lobe. The costophrenic angles are clear. There is no pneumothorax. The cardiac silhouette is enlarged, unchanged. Left-sided pacemaker again noted. IMPRESSION: Patchy airspace disease in the right upper lobe, suspicious for pneumonia. Follow-up chest x-ray recommended in 4-6 weeks to ensure resolution. Electronically Signed   By: Darliss Cheney M.D.   On: 07/01/2023 22:48    Scheduled Meds:  ALPRAZolam  1 mg Oral TID   apixaban  5 mg Oral BID   atorvastatin  40 mg Oral Daily   DULoxetine  30 mg Oral Daily   fluticasone furoate-vilanterol  1 puff Inhalation Daily   And   umeclidinium bromide  1 puff Inhalation Daily   gabapentin  200 mg Oral TID   isosorbide mononitrate  30 mg Oral Daily   levothyroxine  137 mcg Oral Q0600   pantoprazole  40 mg Oral Daily   Continuous Infusions:  azithromycin Stopped (07/02/23 2155)   cefTRIAXone (ROCEPHIN)  IV Stopped (07/02/23 2307)   LOS: 0 days   Time spent: 36 mins  Raymie Trani,  MD How to contact the [facility] Attending or Consulting provider 7A - 7P or covering provider during after hours 7P -7A, for this patient?  Check the care team in [facility] and look for a) attending/consulting [facility] provider listed and b) the [facility] team listed Log into [email or web address] to find provider on call.  Locate the [facility] provider you are looking for under [facility] and page to a number that you can be directly reached. If you still have difficulty reaching the provider, please page the [facility] (Director on Call) for the Hospitalists listed on amion for assistance.  07/03/2023, 12:39 PM

## 2023-07-03 NOTE — Hospital Course (Signed)
77 y.o. male with medical history significant of dementia, diabetes mellitus type 2, hyperlipidemia, hypertension, hypothyroidism, paroxysmal atrial tachycardia on Eliquis, and more presents to the ED with a chief complaint of generalized weakness.  Patient reports that he felt like he was dying.  It started a few days ago.  He had nausea, hurting in his abdomen that is chronic, pain in his back.  He reports he could not walk and he is normally able to ambulate.  He had nausea with 1 episode of vomiting.  There was no hematemesis.  Patient denies any diarrhea.  He has been having normal bowel movements.  He reports he sometimes has blood in his stool due to bleeding hemorrhoids, but nothing abnormal for him.  He reports he has had myalgias as well.  He has had a fever that finally let up yesterday.  He has had a dry cough.  He reports shortness of breath.  Patient reports that he does not wear oxygen at home.  He is feeling a little better since his treatment in the ED.  Patient has no other complaints at this time.   Patient does not smoke and does not drink.   Wife is at bedside and helps with the history given patient is hard of hearing and dementia.

## 2023-07-04 DIAGNOSIS — I2510 Atherosclerotic heart disease of native coronary artery without angina pectoris: Secondary | ICD-10-CM | POA: Diagnosis not present

## 2023-07-04 DIAGNOSIS — N179 Acute kidney failure, unspecified: Secondary | ICD-10-CM | POA: Diagnosis not present

## 2023-07-04 DIAGNOSIS — J189 Pneumonia, unspecified organism: Secondary | ICD-10-CM | POA: Diagnosis not present

## 2023-07-04 DIAGNOSIS — E782 Mixed hyperlipidemia: Secondary | ICD-10-CM | POA: Diagnosis not present

## 2023-07-04 MED ORDER — ALPRAZOLAM 1 MG PO TABS: 1.0000 mg | ORAL_TABLET | Freq: Three times a day (TID) | ORAL | Status: AC | PRN

## 2023-07-04 MED ORDER — GABAPENTIN 300 MG PO CAPS: 300.0000 mg | ORAL_CAPSULE | Freq: Every evening | ORAL | Status: AC | PRN

## 2023-07-04 MED ORDER — DOXYCYCLINE HYCLATE 100 MG PO CAPS: 100.0000 mg | ORAL_CAPSULE | Freq: Two times a day (BID) | ORAL | 0 refills | Status: AC

## 2023-07-04 NOTE — Plan of Care (Signed)
  Problem: Activity: Goal: Ability to tolerate increased activity will improve Outcome: Adequate for Discharge   Problem: Clinical Measurements: Goal: Ability to maintain a body temperature in the normal range will improve Outcome: Adequate for Discharge   Problem: Respiratory: Goal: Ability to maintain adequate ventilation will improve Outcome: Adequate for Discharge Goal: Ability to maintain a clear airway will improve Outcome: Adequate for Discharge   Problem: Education: Goal: Knowledge of General Education information will improve Description: Including pain rating scale, medication(s)/side effects and non-pharmacologic comfort measures Outcome: Adequate for Discharge   Problem: Health Behavior/Discharge Planning: Goal: Ability to manage health-related needs will improve Outcome: Adequate for Discharge   Problem: Clinical Measurements: Goal: Ability to maintain clinical measurements within normal limits will improve Outcome: Adequate for Discharge Goal: Will remain free from infection Outcome: Adequate for Discharge Goal: Diagnostic test results will improve Outcome: Adequate for Discharge Goal: Respiratory complications will improve Outcome: Adequate for Discharge Goal: Cardiovascular complication will be avoided Outcome: Adequate for Discharge   Problem: Activity: Goal: Risk for activity intolerance will decrease Outcome: Adequate for Discharge   Problem: Nutrition: Goal: Adequate nutrition will be maintained Outcome: Adequate for Discharge   Problem: Coping: Goal: Level of anxiety will decrease Outcome: Adequate for Discharge   Problem: Elimination: Goal: Will not experience complications related to bowel motility Outcome: Adequate for Discharge Goal: Will not experience complications related to urinary retention Outcome: Adequate for Discharge   Problem: Pain Management: Goal: General experience of comfort will improve Outcome: Adequate for Discharge    Problem: Safety: Goal: Ability to remain free from injury will improve Outcome: Adequate for Discharge   Problem: Skin Integrity: Goal: Risk for impaired skin integrity will decrease Outcome: Adequate for Discharge   Problem: Acute Rehab PT Goals(only PT should resolve) Goal: Pt Will Go Supine/Side To Sit Outcome: Adequate for Discharge Goal: Patient Will Transfer Sit To/From Stand Outcome: Adequate for Discharge Goal: Pt Will Transfer Bed To Chair/Chair To Bed Outcome: Adequate for Discharge Goal: Pt Will Ambulate Outcome: Adequate for Discharge

## 2023-07-04 NOTE — Discharge Instructions (Signed)
IMPORTANT INFORMATION: PAY CLOSE ATTENTION   PHYSICIAN DISCHARGE INSTRUCTIONS  Follow with Primary care provider  Hall, John Z, MD  and other consultants as instructed by your Hospitalist Physician  SEEK MEDICAL CARE OR RETURN TO EMERGENCY ROOM IF SYMPTOMS COME BACK, WORSEN OR NEW PROBLEM DEVELOPS   Please note: You were cared for by a hospitalist during your hospital stay. Every effort will be made to forward records to your primary care provider.  You can request that your primary care provider send for your hospital records if they have not received them.  Once you are discharged, your primary care physician will handle any further medical issues. Please note that NO REFILLS for any discharge medications will be authorized once you are discharged, as it is imperative that you return to your primary care physician (or establish a relationship with a primary care physician if you do not have one) for your post hospital discharge needs so that they can reassess your need for medications and monitor your lab values.  Please get a complete blood count and chemistry panel checked by your Primary MD at your next visit, and again as instructed by your Primary MD.  Get Medicines reviewed and adjusted: Please take all your medications with you for your next visit with your Primary MD  Laboratory/radiological data: Please request your Primary MD to go over all hospital tests and procedure/radiological results at the follow up, please ask your primary care provider to get all Hospital records sent to his/her office.  In some cases, they will be blood work, cultures and biopsy results pending at the time of your discharge. Please request that your primary care provider follow up on these results.  If you are diabetic, please bring your blood sugar readings with you to your follow up appointment with primary care.    Please call and make your follow up appointments as soon as possible.    Also Note the  following: If you experience worsening of your admission symptoms, develop shortness of breath, life threatening emergency, suicidal or homicidal thoughts you must seek medical attention immediately by calling 911 or calling your MD immediately  if symptoms less severe.  You must read complete instructions/literature along with all the possible adverse reactions/side effects for all the Medicines you take and that have been prescribed to you. Take any new Medicines after you have completely understood and accpet all the possible adverse reactions/side effects.   Do not drive when taking Pain medications or sleeping medications (Benzodiazepines)  Do not take more than prescribed Pain, Sleep and Anxiety Medications. It is not advisable to combine anxiety,sleep and pain medications without talking with your primary care practitioner  Special Instructions: If you have smoked or chewed Tobacco  in the last 2 yrs please stop smoking, stop any regular Alcohol  and or any Recreational drug use.  Wear Seat belts while driving.  Do not drive if taking any narcotic, mind altering or controlled substances or recreational drugs or alcohol.       

## 2023-07-04 NOTE — Discharge Summary (Signed)
Physician Discharge Summary  KINTE GRIEVES ZOX:[id number] DOB: October 21, 1945 DOA: 07/01/2023  PCP: Benita Stabile, MD  Admit date: 07/01/2023 Discharge date: 07/04/2023  Admitted From:  Home  Disposition:  Home (declined HH)  Recommendations for Outpatient Follow-up:  Follow up with PCP in 1 weeks  Home Health: declined  Discharge Condition: STABLE   CODE STATUS: FULL DIET: resume previous home diet    Brief Hospitalization Summary: Please see all hospital notes, images, labs for full details of the hospitalization. Admission provider HPI:  77 y.o. male with medical history significant of dementia, diabetes mellitus type 2, hyperlipidemia, hypertension, hypothyroidism, paroxysmal atrial tachycardia on Eliquis, and more presents to the ED with a chief complaint of generalized weakness.  Patient reports that he felt like he was dying.  It started a few days ago.  He had nausea, hurting in his abdomen that is chronic, pain in his back.  He reports he could not walk and he is normally able to ambulate.  He had nausea with 1 episode of vomiting.  There was no hematemesis.  Patient denies any diarrhea.  He has been having normal bowel movements.  He reports he sometimes has blood in his stool due to bleeding hemorrhoids, but nothing abnormal for him.  He reports he has had myalgias as well.  He has had a fever that finally let up yesterday.  He has had a dry cough.  He reports shortness of breath.  Patient reports that he does not wear oxygen at home.  He is feeling a little better since his treatment in the ED.  Patient has no other complaints at this time.   Patient does not smoke and does not drink.   Wife is at bedside and helps with the history given patient is hard of hearing and dementia.  Hospital Course by problem list   CAP (community acquired pneumonia) - treated with Rocephin and Zithromax - WBC trending down  - Negative COVID - Chest x-ray shows patchy airspace disease in the  right upper lobe consistent with pneumonia - DC home on 2 more days of oral doxycycline to complete course of abx    AKI (acute kidney injury) on CKD stage 3b - RESOLVED  - Creatinine increased from 1.69-2.14 - Likely decreased p.o. intake given that he has been ill - holding Farxiga until he can follow up with PCP to discuss if it should be restarted   Gastroesophageal reflux disease - Continue PPI   Anxiety with benzodiazepine dependence - Continue Xanax PRN - Continue Cymbalta   Acquired hypothyroidism - Continue Synthroid   CAD (coronary artery disease) - Continue statin, Imdur - Holding ACE in the setting of AKI - No beta-blocker due to tachybradycardia syndrome - Continue to monitor   Essential hypertension - resume home lisinopril as renal function back to baseline   Mixed hyperlipidemia - Continue statin  Discharge Diagnoses:  Principal Problem:   CAP (community acquired pneumonia) Active Problems:   Mixed hyperlipidemia   Essential hypertension   CAD (coronary artery disease)   Acquired hypothyroidism   Anxiety   Gastroesophageal reflux disease   AKI (acute kidney injury) (HCC)   Discharge Instructions:  Allergies as of 07/04/2023       Reactions   Morphine Other (See Comments)   Affected the heart- was told to not take this again   Tape Other (See Comments)   Certain tapes TEAR THE SKIN   Sulfonamide Derivatives Rash, Other (See Comments)   Blistering on the  skin        Medication List     STOP taking these medications    cyanocobalamin 1000 MCG/ML injection Commonly known as: VITAMIN B12   Farxiga 10 MG Tabs tablet Generic drug: dapagliflozin propanediol   glimepiride 2 MG tablet Commonly known as: AMARYL   potassium chloride 10 MEQ CR capsule Commonly known as: MICRO-K       TAKE these medications    Accu-Chek Guide test strip Generic drug: glucose blood USE TO TEST TWICE DAILY   acetaminophen 500 MG tablet Commonly  known as: TYLENOL Take 500 mg by mouth every 8 (eight) hours as needed for moderate pain or headache.   albuterol 108 (90 Base) MCG/ACT inhaler Commonly known as: VENTOLIN HFA Inhale 1 puff into the lungs every 6 (six) hours as needed for wheezing.   ALPRAZolam 1 MG tablet Commonly known as: XANAX Take 1 tablet (1 mg total) by mouth 3 (three) times daily as needed for anxiety. What changed:  when to take this reasons to take this   atorvastatin 40 MG tablet Commonly known as: LIPITOR Take 40 mg by mouth daily.   doxycycline 100 MG capsule Commonly known as: VIBRAMYCIN Take 1 capsule (100 mg total) by mouth 2 (two) times daily for 2 days.   DULoxetine 30 MG capsule Commonly known as: CYMBALTA Take 30 mg by mouth daily.   Eliquis 5 MG Tabs tablet Generic drug: apixaban TAKE 1 TABLET BY MOUTH TWICE DAILY What changed: how much to take   gabapentin 300 MG capsule Commonly known as: NEURONTIN Take 1 capsule (300 mg total) by mouth at bedtime as needed (neuropathy). What changed:  when to take this reasons to take this   isosorbide mononitrate 30 MG 24 hr tablet Commonly known as: IMDUR TAKE 1 TABLET BY MOUTH EVERY DAY   levothyroxine 137 MCG tablet Commonly known as: SYNTHROID Take 137 mcg by mouth daily.   lisinopril 2.5 MG tablet Commonly known as: ZESTRIL TAKE 1 TABLET BY MOUTH DAILY   nitroGLYCERIN 0.4 MG SL tablet Commonly known as: NITROSTAT Place 0.4 mg under the tongue every 5 (five) minutes as needed for chest pain.   omega-3 acid ethyl esters 1 g capsule Commonly known as: LOVAZA Take 2 g by mouth 2 (two) times daily.   omeprazole 40 MG capsule Commonly known as: PRILOSEC Take 40 mg by mouth daily before breakfast.   oxyCODONE-acetaminophen 10-325 MG tablet Commonly known as: PERCOCET Take 1 tablet by mouth every 4 (four) hours as needed for pain.   Trelegy Ellipta 100-62.5-25 MCG/ACT Aepb Generic drug: Fluticasone-Umeclidin-Vilant Inhale 1  puff into the lungs daily.        Follow-up Information     Benita Stabile, MD. Schedule an appointment as soon as possible for a visit in 1 week(s).   Specialty: Internal Medicine Why: Hospital Follow Up Contact information: [address] [place] [address] [phone]                Allergies  Allergen Reactions   Morphine Other (See Comments)    Affected the heart- was told to not take this again   Tape Other (See Comments)    Certain tapes TEAR THE SKIN   Sulfonamide Derivatives Rash and Other (See Comments)    Blistering on the skin   Allergies as of 07/04/2023       Reactions   Morphine Other (See Comments)   Affected the heart- was told to not take this  again   Tape Other (See Comments)   Certain tapes TEAR THE SKIN   Sulfonamide Derivatives Rash, Other (See Comments)   Blistering on the skin        Medication List     STOP taking these medications    cyanocobalamin 1000 MCG/ML injection Commonly known as: VITAMIN B12   Farxiga 10 MG Tabs tablet Generic drug: dapagliflozin propanediol   glimepiride 2 MG tablet Commonly known as: AMARYL   potassium chloride 10 MEQ CR capsule Commonly known as: MICRO-K       TAKE these medications    Accu-Chek Guide test strip Generic drug: glucose blood USE TO TEST TWICE DAILY   acetaminophen 500 MG tablet Commonly known as: TYLENOL Take 500 mg by mouth every 8 (eight) hours as needed for moderate pain or headache.   albuterol 108 (90 Base) MCG/ACT inhaler Commonly known as: VENTOLIN HFA Inhale 1 puff into the lungs every 6 (six) hours as needed for wheezing.   ALPRAZolam 1 MG tablet Commonly known as: XANAX Take 1 tablet (1 mg total) by mouth 3 (three) times daily as needed for anxiety. What changed:  when to take this reasons to take this   atorvastatin 40 MG tablet Commonly known as: LIPITOR Take 40 mg by mouth daily.   doxycycline 100 MG capsule Commonly known as:  VIBRAMYCIN Take 1 capsule (100 mg total) by mouth 2 (two) times daily for 2 days.   DULoxetine 30 MG capsule Commonly known as: CYMBALTA Take 30 mg by mouth daily.   Eliquis 5 MG Tabs tablet Generic drug: apixaban TAKE 1 TABLET BY MOUTH TWICE DAILY What changed: how much to take   gabapentin 300 MG capsule Commonly known as: NEURONTIN Take 1 capsule (300 mg total) by mouth at bedtime as needed (neuropathy). What changed:  when to take this reasons to take this   isosorbide mononitrate 30 MG 24 hr tablet Commonly known as: IMDUR TAKE 1 TABLET BY MOUTH EVERY DAY   levothyroxine 137 MCG tablet Commonly known as: SYNTHROID Take 137 mcg by mouth daily.   lisinopril 2.5 MG tablet Commonly known as: ZESTRIL TAKE 1 TABLET BY MOUTH DAILY   nitroGLYCERIN 0.4 MG SL tablet Commonly known as: NITROSTAT Place 0.4 mg under the tongue every 5 (five) minutes as needed for chest pain.   omega-3 acid ethyl esters 1 g capsule Commonly known as: LOVAZA Take 2 g by mouth 2 (two) times daily.   omeprazole 40 MG capsule Commonly known as: PRILOSEC Take 40 mg by mouth daily before breakfast.   oxyCODONE-acetaminophen 10-325 MG tablet Commonly known as: PERCOCET Take 1 tablet by mouth every 4 (four) hours as needed for pain.   Trelegy Ellipta 100-62.5-25 MCG/ACT Aepb Generic drug: Fluticasone-Umeclidin-Vilant Inhale 1 puff into the lungs daily.        Procedures/Studies: CT ABDOMEN PELVIS W CONTRAST  Result Date: 07/02/2023 CLINICAL DATA:  Weakness, general abdominal pain, received COVID vaccine 3 days ago. Tired and sleeping since. EXAM: CT ABDOMEN AND PELVIS WITH CONTRAST TECHNIQUE: Multidetector CT imaging of the abdomen and pelvis was performed using the standard protocol following bolus administration of intravenous contrast. RADIATION DOSE REDUCTION: This exam was performed according to the departmental dose-optimization program which includes automated exposure control,  adjustment of the mA and/or kV according to patient size and/or use of iterative reconstruction technique. CONTRAST:  80mL OMNIPAQUE IOHEXOL 300 MG/ML  SOLN COMPARISON:  02/09/2022 FINDINGS: Lower chest: No acute abnormality. Hepatobiliary: Hepatic steatosis. No focal liver abnormality  is seen. No gallstones, gallbladder wall thickening, or biliary dilatation. Pancreas: Unremarkable. Spleen: Unremarkable. Adrenals/Urinary Tract: Stable adrenal glands. Mild rotated right kidney. Nonobstructing tiny right nephrolithiasis. No obstructing ureteral calculi or hydronephrosis. Unremarkable bladder. Stomach/Bowel: Normal caliber large and small bowel without bowel wall thickening. The appendix and stomach are within normal limits. Vascular/Lymphatic: Aortic atherosclerosis. No enlarged abdominal or pelvic lymph nodes. Reproductive: Prostate is unremarkable. Other: No free intraperitoneal fluid or air. Musculoskeletal: No acute fracture. IMPRESSION: 1. No acute abnormality in the abdomen or pelvis. 2. Nonobstructing right nephrolithiasis. Hepatic steatosis. Aortic Atherosclerosis (ICD10-I70.0). Electronically Signed   By: Minerva Fester M.D.   On: 07/02/2023 00:16   DG Chest Port 1 View  Result Date: 07/01/2023 CLINICAL DATA:  Shortness of breath EXAM: PORTABLE CHEST 1 VIEW COMPARISON:  Chest x-ray 10/28/2021. FINDINGS: There is patchy airspace disease in the right upper lobe. The costophrenic angles are clear. There is no pneumothorax. The cardiac silhouette is enlarged, unchanged. Left-sided pacemaker again noted. IMPRESSION: Patchy airspace disease in the right upper lobe, suspicious for pneumonia. Follow-up chest x-ray recommended in 4-6 weeks to ensure resolution. Electronically Signed   By: Darliss Cheney M.D.   On: 07/01/2023 22:48     Subjective: Pt back to baseline mentation and has been eating  Discharge Exam: Vitals:   07/04/23 0350 07/04/23 0741  BP: (!) 148/73   Pulse: 85   Resp: 18   Temp: 97.7  F (36.5 C)   SpO2: 98% 98%   Vitals:   07/03/23 1436 07/03/23 2115 07/04/23 0350 07/04/23 0741  BP:  (!) 148/62 (!) 148/73   Pulse:  75 85   Resp:  19 18   Temp:  98.4 F (36.9 C) 97.7 F (36.5 C)   TempSrc:   Oral   SpO2: 94% 98% 98% 98%  Weight:      Height:       General: Pt is alert, awake, not in acute distress Cardiovascular: normal S1/S2 +, no rubs, no gallops Respiratory: no increased work of breathing  Abdominal: Soft, NT, ND, bowel sounds + Extremities: no edema, no cyanosis   The results of significant diagnostics from this hospitalization (including imaging, microbiology, ancillary and laboratory) are listed below for reference.     Microbiology: Recent Results (from the past 240 hour(s))  Resp panel by RT-PCR (RSV, Flu A&B, Covid) Anterior Nasal Swab     Status: None   Collection Time: 07/01/23  7:22 PM   Specimen: Anterior Nasal Swab  Result Value Ref Range Status   SARS Coronavirus 2 by RT PCR NEGATIVE NEGATIVE Final    Comment: (NOTE) SARS-CoV-2 target nucleic acids are NOT DETECTED.  The SARS-CoV-2 RNA is generally detectable in upper respiratory specimens during the acute phase of infection. The lowest concentration of SARS-CoV-2 viral copies this assay can detect is 138 copies/mL. A negative result does not preclude SARS-Cov-2 infection and should not be used as the sole basis for treatment or other patient management decisions. A negative result may occur with  improper specimen collection/handling, submission of specimen other than nasopharyngeal swab, presence of viral mutation(s) within the areas targeted by this assay, and inadequate number of viral copies(<138 copies/mL). A negative result must be combined with clinical observations, patient history, and epidemiological information. The expected result is Negative.  Fact Sheet for Patients:  BloggerCourse.com  Fact Sheet for Healthcare Providers:   SeriousBroker.it  This test is no t yet approved or cleared by the [place] and  has been authorized  for detection and/or diagnosis of SARS-CoV-2 by FDA under an Emergency Use Authorization (EUA). This EUA will remain  in effect (meaning this test can be used) for the duration of the COVID-19 declaration under Section 564(b)(1) of the Act, 21 U.S.C.section 360bbb-3(b)(1), unless the authorization is terminated  or revoked sooner.       Influenza A by PCR NEGATIVE NEGATIVE Final   Influenza B by PCR NEGATIVE NEGATIVE Final    Comment: (NOTE) The Xpert Xpress SARS-CoV-2/FLU/RSV plus assay is intended as an aid in the diagnosis of influenza from Nasopharyngeal swab specimens and should not be used as a sole basis for treatment. Nasal washings and aspirates are unacceptable for Xpert Xpress SARS-CoV-2/FLU/RSV testing.  Fact Sheet for Patients: BloggerCourse.com  Fact Sheet for Healthcare Providers: SeriousBroker.it  This test is not yet approved or cleared by the [place] FDA and has been authorized for detection and/or diagnosis of SARS-CoV-2 by FDA under an Emergency Use Authorization (EUA). This EUA will remain in effect (meaning this test can be used) for the duration of the COVID-19 declaration under Section 564(b)(1) of the Act, 21 U.S.C. section 360bbb-3(b)(1), unless the authorization is terminated or revoked.     Resp Syncytial Virus by PCR NEGATIVE NEGATIVE Final    Comment: (NOTE) Fact Sheet for Patients: BloggerCourse.com  Fact Sheet for Healthcare Providers: SeriousBroker.it  This test is not yet approved or cleared by the [place] FDA and has been authorized for detection and/or diagnosis of SARS-CoV-2 by FDA under an Emergency Use Authorization (EUA). This EUA will remain in effect (meaning this test can be used) for  the duration of the COVID-19 declaration under Section 564(b)(1) of the Act, 21 U.S.C. section 360bbb-3(b)(1), unless the authorization is terminated or revoked.  Performed at [facility], [address] [place], [place] [address]   MRSA Next Gen by PCR, Nasal     Status: None   Collection Time: 07/03/23 10:00 PM   Specimen: Nasal Mucosa; Nasal Swab  Result Value Ref Range Status   MRSA by PCR Next Gen NOT DETECTED NOT DETECTED Final    Comment: (NOTE) The GeneXpert MRSA Assay (FDA approved for NASAL specimens only), is one component of a comprehensive MRSA colonization surveillance program. It is not intended to diagnose MRSA infection nor to guide or monitor treatment for MRSA infections. Test performance is not FDA approved in patients less than 72 years old. Performed at [facility], [address], [place], [place] [address]      Labs: BNP (last 3 results) Recent Labs    07/01/23 1828  BNP 196.0*   Basic Metabolic Panel: Recent Labs  Lab 07/01/23 1828 07/02/23 0445 07/03/23 0429  NA 133* 135 135  K 3.9 4.5 4.0  CL 106 105 106  CO2 20* 22 22  GLUCOSE 179* 136* 86  BUN 19 18 17   CREATININE 2.14* 1.90* 1.65*  CALCIUM 8.5* 8.4* 8.3*  MG  --  2.1  --    Liver Function Tests: Recent Labs  Lab 07/01/23 1828 07/02/23 0445  AST 12* 11*  ALT 14 14  ALKPHOS 78 78  BILITOT 1.1 0.6  PROT 6.5 6.4*  ALBUMIN 3.3* 3.2*   Recent Labs  Lab 07/01/23 1828  LIPASE 23   No results for input(s): "AMMONIA" in the last 168 hours. CBC: Recent Labs  Lab 07/01/23 1828 07/02/23 0615 07/03/23 0429  WBC 16.8* 14.0* 8.9  NEUTROABS 13.5* 11.4* 6.8  HGB 12.8* 12.6* 11.2*  HCT 40.7 40.8 35.6*  MCV 89.8 90.7 90.1  PLT 132* 126* 108*   Cardiac Enzymes: No results for input(s): "CKTOTAL", "CKMB", "CKMBINDEX", "TROPONINI" in the last 168 hours. BNP: Invalid input(s): "POCBNP" CBG: No results for input(s): "GLUCAP" in the last 168 hours. D-Dimer No results for  input(s): "DDIMER" in the last 72 hours. Hgb A1c No results for input(s): "HGBA1C" in the last 72 hours. Lipid Profile No results for input(s): "CHOL", "HDL", "LDLCALC", "TRIG", "CHOLHDL", "LDLDIRECT" in the last 72 hours. Thyroid function studies Recent Labs    07/02/23 0445  TSH 0.351   Anemia work up No results for input(s): "VITAMINB12", "FOLATE", "FERRITIN", "TIBC", "IRON", "RETICCTPCT" in the last 72 hours. Urinalysis    Component Value Date/Time   COLORURINE YELLOW 07/01/2023 2157   APPEARANCEUR CLEAR 07/01/2023 2157   LABSPEC 1.024 07/01/2023 2157   PHURINE 5.0 07/01/2023 2157   GLUCOSEU >=500 (A) 07/01/2023 2157   HGBUR NEGATIVE 07/01/2023 2157   BILIRUBINUR NEGATIVE 07/01/2023 2157   KETONESUR NEGATIVE 07/01/2023 2157   PROTEINUR NEGATIVE 07/01/2023 2157   NITRITE NEGATIVE 07/01/2023 2157   LEUKOCYTESUR NEGATIVE 07/01/2023 2157   Sepsis Labs Recent Labs  Lab 07/01/23 1828 07/02/23 0615 07/03/23 0429  WBC 16.8* 14.0* 8.9   Microbiology Recent Results (from the past 240 hour(s))  Resp panel by RT-PCR (RSV, Flu A&B, Covid) Anterior Nasal Swab     Status: None   Collection Time: 07/01/23  7:22 PM   Specimen: Anterior Nasal Swab  Result Value Ref Range Status   SARS Coronavirus 2 by RT PCR NEGATIVE NEGATIVE Final    Comment: (NOTE) SARS-CoV-2 target nucleic acids are NOT DETECTED.  The SARS-CoV-2 RNA is generally detectable in upper respiratory specimens during the acute phase of infection. The lowest concentration of SARS-CoV-2 viral copies this assay can detect is 138 copies/mL. A negative result does not preclude SARS-Cov-2 infection and should not be used as the sole basis for treatment or other patient management decisions. A negative result may occur with  improper specimen collection/handling, submission of specimen other than nasopharyngeal swab, presence of viral mutation(s) within the areas targeted by this assay, and inadequate number of  viral copies(<138 copies/mL). A negative result must be combined with clinical observations, patient history, and epidemiological information. The expected result is Negative.  Fact Sheet for Patients:  BloggerCourse.com  Fact Sheet for Healthcare Providers:  SeriousBroker.it  This test is no t yet approved or cleared by the [place] FDA and  has been authorized for detection and/or diagnosis of SARS-CoV-2 by FDA under an Emergency Use Authorization (EUA). This EUA will remain  in effect (meaning this test can be used) for the duration of the COVID-19 declaration under Section 564(b)(1) of the Act, 21 U.S.C.section 360bbb-3(b)(1), unless the authorization is terminated  or revoked sooner.       Influenza A by PCR NEGATIVE NEGATIVE Final   Influenza B by PCR NEGATIVE NEGATIVE Final    Comment: (NOTE) The Xpert Xpress SARS-CoV-2/FLU/RSV plus assay is intended as an aid in the diagnosis of influenza from Nasopharyngeal swab specimens and should not be used as a sole basis for treatment. Nasal washings and aspirates are unacceptable for Xpert Xpress SARS-CoV-2/FLU/RSV testing.  Fact Sheet for Patients: BloggerCourse.com  Fact Sheet for Healthcare Providers: SeriousBroker.it  This test is not yet approved or cleared by the [place] FDA and has been authorized for detection and/or diagnosis of SARS-CoV-2 by FDA under an Emergency Use Authorization (EUA). This EUA will remain in effect (meaning this test can  be used) for the duration of the COVID-19 declaration under Section 564(b)(1) of the Act, 21 U.S.C. section 360bbb-3(b)(1), unless the authorization is terminated or revoked.     Resp Syncytial Virus by PCR NEGATIVE NEGATIVE Final    Comment: (NOTE) Fact Sheet for Patients: BloggerCourse.com  Fact Sheet for Healthcare  Providers: SeriousBroker.it  This test is not yet approved or cleared by the [place] FDA and has been authorized for detection and/or diagnosis of SARS-CoV-2 by FDA under an Emergency Use Authorization (EUA). This EUA will remain in effect (meaning this test can be used) for the duration of the COVID-19 declaration under Section 564(b)(1) of the Act, 21 U.S.C. section 360bbb-3(b)(1), unless the authorization is terminated or revoked.  Performed at [facility], [address] [place], [place] [address]   MRSA Next Gen by PCR, Nasal     Status: None   Collection Time: 07/03/23 10:00 PM   Specimen: Nasal Mucosa; Nasal Swab  Result Value Ref Range Status   MRSA by PCR Next Gen NOT DETECTED NOT DETECTED Final    Comment: (NOTE) The GeneXpert MRSA Assay (FDA approved for NASAL specimens only), is one component of a comprehensive MRSA colonization surveillance program. It is not intended to diagnose MRSA infection nor to guide or monitor treatment for MRSA infections. Test performance is not FDA approved in patients less than 34 years old. Performed at [facility], [address], [place], [place] [address]     Time coordinating discharge:  41 mins  SIGNED:  Standley Dakins, MD  [facility] 07/04/2023, 10:21 AM How to contact the [facility] Attending or Consulting provider 7A - 7P or covering provider during after hours 7P -7A, for this patient?  Check the care team in [facility] and look for a) attending/consulting [facility] provider listed and b) the [facility] team listed Log into [email or web address] and use [place]'s universal password to access. If you do not have the password, please contact the hospital operator. Locate the [facility] provider you are looking for under [facility] and page to a number that you can be directly reached. If you still have difficulty reaching the provider, please page the [facility] (Director on Call) for the Hospitalists listed on amion for  assistance.

## 2023-07-04 NOTE — Progress Notes (Signed)
Mobility Specialist Progress Note:    07/04/23 1000  Oxygen Therapy  SpO2 96 %  O2 Device Room Air  Mobility  Activity Transferred from bed to chair  Level of Assistance Minimal assist, patient does 75% or more  Assistive Device None  Distance Ambulated (ft) 4 ft  Range of Motion/Exercises Active;All extremities  Activity Response Tolerated well  Mobility Referral Yes  $Mobility charge 1 Mobility  Mobility Specialist Start Time (ACUTE ONLY) 1000  Mobility Specialist Stop Time (ACUTE ONLY) 1020  Mobility Specialist Time Calculation (min) (ACUTE ONLY) 20 min   Pt received in bed, agreeable to mobility. Required MinA to stand and transfer to chair. Tolerated well, asx throughout. Left pt in chair, alarm on. Wife in room, all needs met.   Lawerance Bach Mobility Specialist Please contact via Special educational needs teacher or  Rehab office at [phone]

## 2023-07-04 NOTE — Plan of Care (Signed)
Rested well during the night. Wife at bedside. One person assist to bathroom with walker.  Problem: Activity: Goal: Ability to tolerate increased activity will improve Outcome: Progressing   Problem: Clinical Measurements: Goal: Ability to maintain a body temperature in the normal range will improve Outcome: Progressing   Problem: Respiratory: Goal: Ability to maintain a clear airway will improve Outcome: Progressing   Problem: Education: Goal: Knowledge of General Education information will improve Description: Including pain rating scale, medication(s)/side effects and non-pharmacologic comfort measures Outcome: Progressing   Problem: Health Behavior/Discharge Planning: Goal: Ability to manage health-related needs will improve Outcome: Progressing   Problem: Clinical Measurements: Goal: Ability to maintain clinical measurements within normal limits will improve Outcome: Progressing Goal: Will remain free from infection Outcome: Progressing Goal: Diagnostic test results will improve Outcome: Progressing Goal: Respiratory complications will improve Outcome: Progressing Goal: Cardiovascular complication will be avoided Outcome: Progressing   Problem: Activity: Goal: Risk for activity intolerance will decrease Outcome: Progressing   Problem: Nutrition: Goal: Adequate nutrition will be maintained Outcome: Progressing   Problem: Coping: Goal: Level of anxiety will decrease Outcome: Progressing   Problem: Elimination: Goal: Will not experience complications related to bowel motility Outcome: Progressing Goal: Will not experience complications related to urinary retention Outcome: Progressing   Problem: Pain Management: Goal: General experience of comfort will improve Outcome: Progressing   Problem: Safety: Goal: Ability to remain free from injury will improve Outcome: Progressing   Problem: Skin Integrity: Goal: Risk for impaired skin integrity will  decrease Outcome: Progressing   Problem: Respiratory: Goal: Ability to maintain adequate ventilation will improve Outcome: Not Progressing

## 2023-07-05 DIAGNOSIS — I2510 Atherosclerotic heart disease of native coronary artery without angina pectoris: Secondary | ICD-10-CM | POA: Diagnosis not present

## 2023-07-05 DIAGNOSIS — Z87891 Personal history of nicotine dependence: Secondary | ICD-10-CM | POA: Diagnosis not present

## 2023-07-05 DIAGNOSIS — N183 Chronic kidney disease, stage 3 unspecified: Secondary | ICD-10-CM | POA: Diagnosis not present

## 2023-07-05 DIAGNOSIS — Z79899 Other long term (current) drug therapy: Secondary | ICD-10-CM | POA: Diagnosis not present

## 2023-07-07 ENCOUNTER — Other Ambulatory Visit: Payer: Self-pay | Admitting: Gastroenterology

## 2023-07-07 MED ORDER — CYANOCOBALAMIN 1000 MCG/ML IJ SOLN: 1000.0000 ug | INTRAMUSCULAR | 11 refills | Status: DC

## 2023-07-12 ENCOUNTER — Encounter: Payer: Self-pay | Admitting: Cardiology

## 2023-07-12 ENCOUNTER — Ambulatory Visit: Payer: 59 | Attending: Cardiology | Admitting: Cardiology

## 2023-07-12 VITALS — BP 130/70 | HR 58 | Ht 68.0 in | Wt 187.6 lb

## 2023-07-12 DIAGNOSIS — Z950 Presence of cardiac pacemaker: Secondary | ICD-10-CM

## 2023-07-12 DIAGNOSIS — I480 Paroxysmal atrial fibrillation: Secondary | ICD-10-CM

## 2023-07-12 DIAGNOSIS — R0789 Other chest pain: Secondary | ICD-10-CM

## 2023-07-12 DIAGNOSIS — E782 Mixed hyperlipidemia: Secondary | ICD-10-CM

## 2023-07-12 NOTE — Progress Notes (Signed)
Clinical Summary [name] is a 77 y.o.male seen today for follow up of the following medical problems.    1. PAF - noted during 10/2018 admission - had not been on anticoag due to frequent falls previously.    - since pacemaker placement falls have resolved, started on eliquis 5mg  bid.      No recent palpitations - compliant with meds. No bleeding one eliquis.   - no recent palpitations - compliant with meds - no bleeding on eliquis.    2. Bradycardia w/ pacemaker - chronic according ot notes. 10/2018 admissoin had some rates to 40s and 50s - heart monitor showed bradycardia and post termination pauses up to 3.2 seconds  06/26/19 pacemaker St Jude medical assurity dual chamber placed  04/2023 normal device check.  - no recent symptoms   3.Chest pain - extensive prior workup including cath in 2010 and stress test 2014 - admit 10/2018 with negative enzymes and echo - 04/2019 stress test no ischemia  11/2020 nuclear stress: inferior/inferoseptal infarct. Mild to mod peri-infarct ischemia apical.   -started on imdur 15 mg daily    - episodes about 2 times a month, steady pain mid to left chest. 5/10 in severity. Can occur at rest or with activity. Resolves with NG x 1.     4. Orthostatic dizziness -Improved since stopping lisinopril   - rare infrequent symptoms.   - working to stay well hydrated - appears back on lisinopril, would assume for his CKD>    - infrequent symptoms.       5. CKD -followed by nephrology - admit 06/2023 with pneumonia, AKI. His ACEi and SGTL2i were held at discharge       Prior cardiology notes   Prevoiusly followed by [facility] cardiology. From notes had normal cardiac monitor 2016. echocardiogram October 2014 ejection fraction 50-55%, G1DD, mild left atrial margin, 1+ AR, 1+ TR, no significant pericardial effusion -Echocardiogram interpreted by Dr. Loletha Carrow [facility] office), September 2014, preserved ejection fraction, small pericardial  effusion, 1+ MR, 2+ AR  -Event monitor September 2014 sinus rhythm, occasional PVCs, nonsustained PAT, and one 7 beat run of a wide QRS complex at 110-120 bpm, with minimal changes QRS axis, and QRS morphology not similar to documented PVCs.  2. Heart catheterization 2010 nonobstructive coronary disease  -exercise Cardiolite stress test negative for ischemia with ejection fraction 61% October 2014 Past Medical History:  Diagnosis Date   Alcohol use    Anxiety    Chest pain    Coronary artery disease    a. Nonobstructive CAD by cath 2010 with negative nuclear stress test 05/2013.   Depression    Diabetes mellitus    Hyperlipidemia    Hypertension    Hypothyroidism    Kidney problem    a. 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   Mild aortic insufficiency    PAT (paroxysmal atrial tachycardia) (HCC)    Pericardial effusion    a. by echo 2014.   Pulmonary nodules    resolved   PVC's (premature ventricular contractions)    Stroke (cerebrum) (HCC)    Stroke due to embolism of left carotid artery (HCC) 03/24/2016   Left caudate head stroke   Wide-complex tachycardia    a. per [name] note: Event monitor September 2014 sinus rhythm, occasional PVCs, nonsustained PAT, and one 7 beat run of a wide QRS complex at 110-120 bpm, with minimal changes QRS axis, and QRS morphology not similar to documented PVCs.  Allergies  Allergen Reactions   Morphine Other (See Comments)    Affected the heart- was told to not take this again   Tape Other (See Comments)    Certain tapes TEAR THE SKIN   Sulfonamide Derivatives Rash and Other (See Comments)    Blistering on the skin     Current Outpatient Medications  Medication Sig Dispense Refill   ACCU-CHEK GUIDE test strip USE TO TEST TWICE DAILY     acetaminophen (TYLENOL) 500 MG tablet Take 500 mg by mouth every 8 (eight) hours as needed for moderate pain or headache.     albuterol (VENTOLIN HFA) 108 (90 Base) MCG/ACT inhaler Inhale 1 puff  into the lungs every 6 (six) hours as needed for wheezing.     ALPRAZolam (XANAX) 1 MG tablet Take 1 tablet (1 mg total) by mouth 3 (three) times daily as needed for anxiety.     atorvastatin (LIPITOR) 40 MG tablet Take 40 mg by mouth daily.     cyanocobalamin (VITAMIN B12) 1000 MCG/ML injection Inject 1 mL (1,000 mcg total) into the skin every 30 (thirty) days. 1 mL 11   DULoxetine (CYMBALTA) 30 MG capsule Take 30 mg by mouth daily.     ELIQUIS 5 MG TABS tablet TAKE 1 TABLET BY MOUTH TWICE DAILY (Patient taking differently: Take 5 mg by mouth 2 (two) times daily.) 60 tablet 5   gabapentin (NEURONTIN) 300 MG capsule Take 1 capsule (300 mg total) by mouth at bedtime as needed (neuropathy).     isosorbide mononitrate (IMDUR) 30 MG 24 hr tablet TAKE 1 TABLET BY MOUTH EVERY DAY 90 tablet 1   levothyroxine (SYNTHROID) 137 MCG tablet Take 137 mcg by mouth daily.     lisinopril (ZESTRIL) 2.5 MG tablet TAKE 1 TABLET BY MOUTH DAILY 90 tablet 1   nitroGLYCERIN (NITROSTAT) 0.4 MG SL tablet Place 0.4 mg under the tongue every 5 (five) minutes as needed for chest pain.      omega-3 acid ethyl esters (LOVAZA) 1 g capsule Take 2 g by mouth 2 (two) times daily.  12   omeprazole (PRILOSEC) 40 MG capsule Take 40 mg by mouth daily before breakfast.     oxyCODONE-acetaminophen (PERCOCET) 10-325 MG tablet Take 1 tablet by mouth every 4 (four) hours as needed for pain.     TRELEGY ELLIPTA 100-62.5-25 MCG/ACT AEPB Inhale 1 puff into the lungs daily.     No current facility-administered medications for this visit.     Past Surgical History:  Procedure Laterality Date   BACK SURGERY  1996   BREAST LUMPECTOMY Left 1979   CIRCUMCISION  1980   COLONOSCOPY  10/18/2014   Dr. Teena Dunk, tubular adenoma in the ascending and distal transverse colon, hyperplastic polyp in sigmoid colon.  Recommended 3-5-year repeat.   COLONOSCOPY WITH PROPOFOL N/A 03/26/2022   Surgeon: Corbin Ade, MD; normal exam.  Repeat in 5 years due  to personal history of polyps.   ESOPHAGOGASTRODUODENOSCOPY (EGD) WITH PROPOFOL N/A 03/26/2022   Surgeon: Corbin Ade, MD; Normal esophagus dilated, small hiatal hernia, normal stomach and examined duodenum.   EYE SURGERY     MALONEY DILATION N/A 03/26/2022   Procedure: Elease Hashimoto DILATION;  Surgeon: Corbin Ade, MD;  Location: [facility];  Service: Endoscopy;  Laterality: N/A;   MANDIBLE SURGERY Right 1969   PACEMAKER IMPLANT N/A 06/26/2019   Procedure: PACEMAKER IMPLANT;  Surgeon: Hillis Range, MD;  Location: [name];  Service: Cardiovascular;  Laterality: N/A;  Allergies  Allergen Reactions   Morphine Other (See Comments)    Affected the heart- was told to not take this again   Tape Other (See Comments)    Certain tapes TEAR THE SKIN   Sulfonamide Derivatives Rash and Other (See Comments)    Blistering on the skin      Family History  Problem Relation Age of Onset   Lung cancer Mother    Diabetes Father    Bladder Cancer Father    Colon cancer Brother        Age 36, passed at age 21   Stroke Maternal Grandmother    Cancer Maternal Grandfather    Stroke Paternal Grandmother    Stroke Paternal Grandfather    Colon cancer Maternal Aunt        79s   Colon cancer Cousin      Social History [name] reports that he quit smoking about 40 years ago. His smoking use included cigarettes. He has never used smokeless tobacco. [name] reports that he does not currently use alcohol.   Review of Systems CONSTITUTIONAL: No weight loss, fever, chills, weakness or fatigue.  HEENT: Eyes: No visual loss, blurred vision, double vision or yellow sclerae.No hearing loss, sneezing, congestion, runny nose or sore throat.  SKIN: No rash or itching.  CARDIOVASCULAR: per hpi RESPIRATORY: No shortness of breath, cough or sputum.  GASTROINTESTINAL: No anorexia, nausea, vomiting or diarrhea. No abdominal pain or blood.  GENITOURINARY: No burning on  urination, no polyuria NEUROLOGICAL: No headache, dizziness, syncope, paralysis, ataxia, numbness or tingling in the extremities. No change in bowel or bladder control.  MUSCULOSKELETAL: No muscle, back pain, joint pain or stiffness.  LYMPHATICS: No enlarged nodes. No history of splenectomy.  PSYCHIATRIC: No history of depression or anxiety.  ENDOCRINOLOGIC: No reports of sweating, cold or heat intolerance. No polyuria or polydipsia.  Marland Kitchen   Physical Examination Today's Vitals   07/12/23 1331  BP: 130/70  Pulse: (!) 58  SpO2: 97%  Weight: 187 lb 9.6 oz (85.1 kg)  Height: 5\' 8"  (1.727 m)   Body mass index is 28.52 kg/m.  Gen: resting comfortably, no acute distress HEENT: no scleral icterus, pupils equal round and reactive, no palptable cervical adenopathy,  CV: RRR, no mrg, no jvd Resp: Clear to auscultation bilaterally GI: abdomen is soft, non-tender, non-distended, normal bowel sounds, no hepatosplenomegaly MSK: extremities are warm, no edema.  Skin: warm, no rash Neuro:  no focal deficits Psych: appropriate affect   Diagnostic Studies  01/2019 event monitor 7 day event monitor. Date is available from 83% of planned monitored time Min HR 52, Max HR 111, Avg HR 65 Sinus rhythm with episodes of aflutter with variable conduction, no significant RVR. Can have postconversion pause up to 3.2 seconds. No symptoms reported     10/2018 echo IMPRESSIONS     1. The left ventricle has normal systolic function with an ejection  fraction of 60-65%. The cavity size was normal. There is mild concentric  left ventricular hypertrophy. Left ventricular diastolic Doppler  parameters are consistent with impaired  relaxation. Indeterminate filling pressures No evidence of left  ventricular regional wall motion abnormalities.   2. The right ventricle has normal systolic function. The cavity was  normal. There is no increase in right ventricular wall thickness.   3. The tricuspid valve is  grossly normal.   4. The aortic valve is tricuspid Aortic valve regurgitation is mild by  color flow Doppler.   5. There is  mild dilatation of the aortic root measuring 38 mm.   6. The interatrial septum was not well visualized.      04/2019 nuclear stress There was no ST segment deviation noted during stress. Findings consistent with prior inferior/inferoseptal/inferoapical myocardial infarction. No current ischemia This is a low risk study. The left ventricular ejection fraction is normal (55-65%).   11/2020 echo  1. Left ventricular ejection fraction, by estimation, is 55 to 60%. The  left ventricle has normal function. The left ventricle has no regional  wall motion abnormalities. There is mild left ventricular hypertrophy.  Left ventricular diastolic parameters  are consistent with Grade I diastolic dysfunction (impaired relaxation).  Elevated left atrial pressure.   2. Right ventricular systolic function is normal. The right ventricular  size is normal.   3. The pericardial effusion is circumferential.   4. The mitral valve is normal in structure. No evidence of mitral valve  regurgitation. No evidence of mitral stenosis.   5. The aortic valve is tricuspid. Aortic valve regurgitation is mild. No  aortic stenosis is present.   6. The inferior vena cava is normal in size with greater than 50%  respiratory variability, suggesting right atrial pressure of 3 mmHg.     11/2020 nuclear stress There was no ST segment deviation noted during stress. Findings consistent with prior inferior/inferoseptal myocardial infarction. There is a small apical infarct with mild to moderate peri-infarct ischemia. The left ventricular ejection fraction is normal (55-65%). Low to intermediate risk study.       Assessment and Plan   1. Chest pain - long history of chest pain symptoms with negative workups in the past including cath, multiple stress tests  - most recent stress test low to  intermediate risk, working with medical therapy to manage symptoms -infrequent symptoms, continue to treat medically at this time. High threshold to consider cath given renal dysfunction.      2. Afib/acquired thrombophilia -no symptoms, continue current meds incliudkng    3.Pacemaker - normal device check recently, continue to monitor - no symptoms   4. Hyperlipidemia - has been at goal,, continue current meds     [name], M.D.

## 2023-07-12 NOTE — Patient Instructions (Signed)
Medication Instructions:  Continue all current medications.   Labwork: none  Testing/Procedures: none  Follow-Up: 6 months   Any Other Special Instructions Will Be Listed Below (If Applicable).   If you need a refill on your cardiac medications before your next appointment, please call your pharmacy.  

## 2023-08-03 ENCOUNTER — Other Ambulatory Visit (HOSPITAL_COMMUNITY): Payer: Self-pay | Admitting: Family Medicine

## 2023-08-03 DIAGNOSIS — J189 Pneumonia, unspecified organism: Secondary | ICD-10-CM

## 2023-08-09 DIAGNOSIS — H3561 Retinal hemorrhage, right eye: Secondary | ICD-10-CM | POA: Diagnosis not present

## 2023-08-09 DIAGNOSIS — H353211 Exudative age-related macular degeneration, right eye, with active choroidal neovascularization: Secondary | ICD-10-CM | POA: Diagnosis not present

## 2023-08-18 ENCOUNTER — Ambulatory Visit (INDEPENDENT_AMBULATORY_CARE_PROVIDER_SITE_OTHER): Payer: 59

## 2023-08-18 DIAGNOSIS — I495 Sick sinus syndrome: Secondary | ICD-10-CM

## 2023-08-25 LAB — CUP PACEART REMOTE DEVICE CHECK
Battery Remaining Longevity: 86 mo
Battery Remaining Percentage: 71 %
Battery Voltage: 2.99 V
Brady Statistic AP VP Percent: 1 %
Brady Statistic AP VS Percent: 27 %
Brady Statistic AS VP Percent: 1 %
Brady Statistic AS VS Percent: 73 %
Brady Statistic RA Percent Paced: 27 %
Brady Statistic RV Percent Paced: 1 %
Date Time Interrogation Session: 20241226020023
Implantable Lead Connection Status: 753985
Implantable Lead Connection Status: 753985
Implantable Lead Implant Date: 20201103
Implantable Lead Implant Date: 20201103
Implantable Lead Location: 753859
Implantable Lead Location: 753860
Implantable Pulse Generator Implant Date: 20201103
Lead Channel Impedance Value: 400 Ohm
Lead Channel Impedance Value: 480 Ohm
Lead Channel Pacing Threshold Amplitude: 0.5 V
Lead Channel Pacing Threshold Amplitude: 0.75 V
Lead Channel Pacing Threshold Pulse Width: 0.5 ms
Lead Channel Pacing Threshold Pulse Width: 0.5 ms
Lead Channel Sensing Intrinsic Amplitude: 10.1 mV
Lead Channel Sensing Intrinsic Amplitude: 2.4 mV
Lead Channel Setting Pacing Amplitude: 2 V
Lead Channel Setting Pacing Amplitude: 2.5 V
Lead Channel Setting Pacing Pulse Width: 0.5 ms
Lead Channel Setting Sensing Sensitivity: 2 mV
Pulse Gen Model: 2272
Pulse Gen Serial Number: 9174806

## 2023-09-14 DIAGNOSIS — I129 Hypertensive chronic kidney disease with stage 1 through stage 4 chronic kidney disease, or unspecified chronic kidney disease: Secondary | ICD-10-CM | POA: Diagnosis not present

## 2023-09-14 DIAGNOSIS — E1165 Type 2 diabetes mellitus with hyperglycemia: Secondary | ICD-10-CM | POA: Diagnosis not present

## 2023-09-14 DIAGNOSIS — E538 Deficiency of other specified B group vitamins: Secondary | ICD-10-CM | POA: Diagnosis not present

## 2023-09-14 DIAGNOSIS — E039 Hypothyroidism, unspecified: Secondary | ICD-10-CM | POA: Diagnosis not present

## 2023-09-20 DIAGNOSIS — K219 Gastro-esophageal reflux disease without esophagitis: Secondary | ICD-10-CM | POA: Diagnosis not present

## 2023-09-20 DIAGNOSIS — E039 Hypothyroidism, unspecified: Secondary | ICD-10-CM | POA: Diagnosis not present

## 2023-09-20 DIAGNOSIS — G894 Chronic pain syndrome: Secondary | ICD-10-CM | POA: Diagnosis not present

## 2023-09-20 DIAGNOSIS — I2510 Atherosclerotic heart disease of native coronary artery without angina pectoris: Secondary | ICD-10-CM | POA: Diagnosis not present

## 2023-09-20 DIAGNOSIS — I129 Hypertensive chronic kidney disease with stage 1 through stage 4 chronic kidney disease, or unspecified chronic kidney disease: Secondary | ICD-10-CM | POA: Diagnosis not present

## 2023-09-20 DIAGNOSIS — M545 Low back pain, unspecified: Secondary | ICD-10-CM | POA: Diagnosis not present

## 2023-09-20 DIAGNOSIS — E1165 Type 2 diabetes mellitus with hyperglycemia: Secondary | ICD-10-CM | POA: Diagnosis not present

## 2023-09-20 DIAGNOSIS — E782 Mixed hyperlipidemia: Secondary | ICD-10-CM | POA: Diagnosis not present

## 2023-09-20 DIAGNOSIS — N1832 Chronic kidney disease, stage 3b: Secondary | ICD-10-CM | POA: Diagnosis not present

## 2023-09-20 DIAGNOSIS — E1142 Type 2 diabetes mellitus with diabetic polyneuropathy: Secondary | ICD-10-CM | POA: Diagnosis not present

## 2023-09-20 DIAGNOSIS — N183 Chronic kidney disease, stage 3 unspecified: Secondary | ICD-10-CM | POA: Diagnosis not present

## 2023-09-28 NOTE — Addendum Note (Signed)
Addended by: Elease Etienne A on: 09/28/2023 02:26 PM   Modules accepted: Orders

## 2023-09-28 NOTE — Progress Notes (Signed)
 Remote pacemaker transmission.

## 2023-10-18 ENCOUNTER — Other Ambulatory Visit: Payer: Self-pay

## 2023-10-18 DIAGNOSIS — E538 Deficiency of other specified B group vitamins: Secondary | ICD-10-CM

## 2023-10-28 ENCOUNTER — Other Ambulatory Visit (HOSPITAL_COMMUNITY)
Admission: RE | Admit: 2023-10-28 | Discharge: 2023-10-28 | Disposition: A | Discharge status: Home / Self Care | Source: Ambulatory Visit | Attending: Gastroenterology | Admitting: Gastroenterology

## 2023-10-28 DIAGNOSIS — E538 Deficiency of other specified B group vitamins: Secondary | ICD-10-CM | POA: Diagnosis not present

## 2023-10-28 LAB — VITAMIN B12: Vitamin B-12: 384 pg/mL (ref 180–914)

## 2023-10-31 ENCOUNTER — Other Ambulatory Visit: Payer: Self-pay

## 2023-10-31 DIAGNOSIS — E538 Deficiency of other specified B group vitamins: Secondary | ICD-10-CM

## 2023-11-17 ENCOUNTER — Ambulatory Visit (INDEPENDENT_AMBULATORY_CARE_PROVIDER_SITE_OTHER): Payer: Medicare Other

## 2023-11-17 DIAGNOSIS — I495 Sick sinus syndrome: Secondary | ICD-10-CM | POA: Diagnosis not present

## 2023-11-22 LAB — CUP PACEART REMOTE DEVICE CHECK
Battery Remaining Longevity: 84 mo
Battery Remaining Percentage: 69 %
Battery Voltage: 2.99 V
Brady Statistic AP VP Percent: 1 %
Brady Statistic AP VS Percent: 28 %
Brady Statistic AS VP Percent: 1 %
Brady Statistic AS VS Percent: 71 %
Brady Statistic RA Percent Paced: 28 %
Brady Statistic RV Percent Paced: 1 %
Date Time Interrogation Session: 20250327020015
Implantable Lead Connection Status: 753985
Implantable Lead Connection Status: 753985
Implantable Lead Implant Date: 20201103
Implantable Lead Implant Date: 20201103
Implantable Lead Location: 753859
Implantable Lead Location: 753860
Implantable Pulse Generator Implant Date: 20201103
Lead Channel Impedance Value: 400 Ohm
Lead Channel Impedance Value: 480 Ohm
Lead Channel Pacing Threshold Amplitude: 0.5 V
Lead Channel Pacing Threshold Amplitude: 0.75 V
Lead Channel Pacing Threshold Pulse Width: 0.5 ms
Lead Channel Pacing Threshold Pulse Width: 0.5 ms
Lead Channel Sensing Intrinsic Amplitude: 2.1 mV
Lead Channel Sensing Intrinsic Amplitude: 9.5 mV
Lead Channel Setting Pacing Amplitude: 2 V
Lead Channel Setting Pacing Amplitude: 2.5 V
Lead Channel Setting Pacing Pulse Width: 0.5 ms
Lead Channel Setting Sensing Sensitivity: 2 mV
Pulse Gen Model: 2272
Pulse Gen Serial Number: 9174806

## 2023-11-23 ENCOUNTER — Other Ambulatory Visit: Payer: Self-pay | Admitting: Cardiology

## 2023-11-23 DIAGNOSIS — I480 Paroxysmal atrial fibrillation: Secondary | ICD-10-CM

## 2023-11-23 NOTE — Telephone Encounter (Signed)
 Prescription refill request for Eliquis received. Indication: Afib  Last office visit: 07/10/23 ([place])  Scr: 1.65 (07/03/23)  Age: 78 Weight: 85.1kg   Appropriate dose. Refill sent.

## 2023-12-24 ENCOUNTER — Other Ambulatory Visit: Payer: Self-pay | Admitting: Cardiology

## 2023-12-28 NOTE — Progress Notes (Signed)
 Remote pacemaker transmission.

## 2023-12-28 NOTE — Addendum Note (Signed)
 Addended by: Lott Rouleau A on: 12/28/2023 01:10 PM   Modules accepted: Orders

## 2024-01-03 ENCOUNTER — Encounter (HOSPITAL_COMMUNITY): Payer: Self-pay

## 2024-01-03 ENCOUNTER — Other Ambulatory Visit: Payer: Self-pay

## 2024-01-03 ENCOUNTER — Emergency Department (HOSPITAL_COMMUNITY)

## 2024-01-03 ENCOUNTER — Emergency Department (HOSPITAL_COMMUNITY)
Admission: EM | Admit: 2024-01-03 | Discharge: 2024-01-03 | Disposition: A | Discharge status: Home / Self Care | Attending: Emergency Medicine | Admitting: Emergency Medicine

## 2024-01-03 DIAGNOSIS — M1612 Unilateral primary osteoarthritis, left hip: Secondary | ICD-10-CM | POA: Diagnosis not present

## 2024-01-03 DIAGNOSIS — Z7901 Long term (current) use of anticoagulants: Secondary | ICD-10-CM | POA: Diagnosis not present

## 2024-01-03 DIAGNOSIS — M25552 Pain in left hip: Secondary | ICD-10-CM | POA: Diagnosis not present

## 2024-01-03 DIAGNOSIS — Z8673 Personal history of transient ischemic attack (TIA), and cerebral infarction without residual deficits: Secondary | ICD-10-CM | POA: Diagnosis not present

## 2024-01-03 DIAGNOSIS — M160 Bilateral primary osteoarthritis of hip: Secondary | ICD-10-CM | POA: Diagnosis not present

## 2024-01-03 DIAGNOSIS — S79912A Unspecified injury of left hip, initial encounter: Secondary | ICD-10-CM | POA: Diagnosis not present

## 2024-01-03 DIAGNOSIS — Y92410 Unspecified street and highway as the place of occurrence of the external cause: Secondary | ICD-10-CM | POA: Insufficient documentation

## 2024-01-03 DIAGNOSIS — W19XXXA Unspecified fall, initial encounter: Secondary | ICD-10-CM
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
## 2024-01-04 DIAGNOSIS — E1165 Type 2 diabetes mellitus with hyperglycemia: Secondary | ICD-10-CM | POA: Diagnosis not present

## 2024-01-04 DIAGNOSIS — E538 Deficiency of other specified B group vitamins: Secondary | ICD-10-CM | POA: Diagnosis not present

## 2024-01-04 DIAGNOSIS — I129 Hypertensive chronic kidney disease with stage 1 through stage 4 chronic kidney disease, or unspecified chronic kidney disease: Secondary | ICD-10-CM | POA: Diagnosis not present

## 2024-01-04 DIAGNOSIS — E039 Hypothyroidism, unspecified: Secondary | ICD-10-CM | POA: Diagnosis not present

## 2024-01-05 ENCOUNTER — Emergency Department (HOSPITAL_COMMUNITY)
Admission: EM | Admit: 2024-01-05 | Discharge: 2024-01-05 | Disposition: A | Discharge status: Home / Self Care | Attending: Emergency Medicine | Admitting: Emergency Medicine

## 2024-01-05 ENCOUNTER — Encounter (HOSPITAL_COMMUNITY): Payer: Self-pay

## 2024-01-05 ENCOUNTER — Other Ambulatory Visit: Payer: Self-pay

## 2024-01-05 DIAGNOSIS — Z7901 Long term (current) use of anticoagulants: Secondary | ICD-10-CM | POA: Insufficient documentation

## 2024-01-05 DIAGNOSIS — M25552 Pain in left hip: Secondary | ICD-10-CM | POA: Diagnosis not present

## 2024-01-05 MED ORDER — ACETAMINOPHEN 500 MG PO TABS
1000.0000 mg | ORAL_TABLET | Freq: Once | ORAL | Status: AC
  Administered 2024-01-05: 1000 mg via ORAL
  Filled 2024-01-05: qty 2

## 2024-01-05 MED ORDER — LIDOCAINE 5 % EX PTCH
1.0000 | MEDICATED_PATCH | CUTANEOUS | Status: DC
  Administered 2024-01-05: 1 via TRANSDERMAL
  Filled 2024-01-05: qty 1

## 2024-01-05 MED ORDER — ACETAMINOPHEN 500 MG PO TABS: 1000.0000 mg | ORAL_TABLET | Freq: Three times a day (TID) | ORAL | 0 refills | Status: AC | PRN

## 2024-01-05 MED ORDER — OXYCODONE HCL 10 MG PO TABS: 10.0000 mg | ORAL_TABLET | ORAL | 0 refills | Status: AC | PRN

## 2024-01-05 MED ORDER — OXYCODONE HCL 5 MG PO TABS
10.0000 mg | ORAL_TABLET | Freq: Once | ORAL | Status: AC
  Administered 2024-01-05: 10 mg via ORAL
  Filled 2024-01-05: qty 2

## 2024-01-05 MED ORDER — LIDOCAINE 5 % EX PTCH: 1.0000 | MEDICATED_PATCH | CUTANEOUS | 0 refills | Status: AC
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
## 2024-01-05 NOTE — ED Notes (Signed)
 Pt c/o lower gum pain, no bleeding noted at present.
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
## 2024-01-10 DIAGNOSIS — E1142 Type 2 diabetes mellitus with diabetic polyneuropathy: Secondary | ICD-10-CM | POA: Diagnosis not present

## 2024-01-10 DIAGNOSIS — E782 Mixed hyperlipidemia: Secondary | ICD-10-CM | POA: Diagnosis not present

## 2024-01-10 DIAGNOSIS — E1122 Type 2 diabetes mellitus with diabetic chronic kidney disease: Secondary | ICD-10-CM | POA: Diagnosis not present

## 2024-01-10 DIAGNOSIS — N183 Chronic kidney disease, stage 3 unspecified: Secondary | ICD-10-CM | POA: Diagnosis not present

## 2024-01-10 DIAGNOSIS — I2510 Atherosclerotic heart disease of native coronary artery without angina pectoris: Secondary | ICD-10-CM | POA: Diagnosis not present

## 2024-01-10 DIAGNOSIS — I129 Hypertensive chronic kidney disease with stage 1 through stage 4 chronic kidney disease, or unspecified chronic kidney disease: Secondary | ICD-10-CM | POA: Diagnosis not present

## 2024-01-10 DIAGNOSIS — Z0001 Encounter for general adult medical examination with abnormal findings: Secondary | ICD-10-CM | POA: Diagnosis not present

## 2024-01-10 DIAGNOSIS — M545 Low back pain, unspecified: Secondary | ICD-10-CM | POA: Diagnosis not present

## 2024-01-10 DIAGNOSIS — G894 Chronic pain syndrome: Secondary | ICD-10-CM | POA: Diagnosis not present

## 2024-01-10 DIAGNOSIS — E1165 Type 2 diabetes mellitus with hyperglycemia: Secondary | ICD-10-CM | POA: Diagnosis not present

## 2024-01-10 DIAGNOSIS — H538 Other visual disturbances: Secondary | ICD-10-CM | POA: Diagnosis not present

## 2024-02-08 DIAGNOSIS — H353212 Exudative age-related macular degeneration, right eye, with inactive choroidal neovascularization: Secondary | ICD-10-CM | POA: Diagnosis not present

## 2024-02-08 DIAGNOSIS — H43822 Vitreomacular adhesion, left eye: Secondary | ICD-10-CM | POA: Diagnosis not present

## 2024-02-08 DIAGNOSIS — H35362 Drusen (degenerative) of macula, left eye: Secondary | ICD-10-CM | POA: Diagnosis not present

## 2024-02-08 DIAGNOSIS — H43812 Vitreous degeneration, left eye: Secondary | ICD-10-CM | POA: Diagnosis not present

## 2024-02-08 DIAGNOSIS — H3561 Retinal hemorrhage, right eye: Secondary | ICD-10-CM | POA: Diagnosis not present

## 2024-02-08 DIAGNOSIS — H35371 Puckering of macula, right eye: Secondary | ICD-10-CM | POA: Diagnosis not present

## 2024-02-08 DIAGNOSIS — H2513 Age-related nuclear cataract, bilateral: Secondary | ICD-10-CM | POA: Diagnosis not present

## 2024-02-16 ENCOUNTER — Ambulatory Visit: Payer: Medicare Other

## 2024-02-16 DIAGNOSIS — I480 Paroxysmal atrial fibrillation: Secondary | ICD-10-CM | POA: Diagnosis not present

## 2024-04-09 ENCOUNTER — Encounter: Payer: Self-pay | Admitting: Internal Medicine

## 2024-04-18 ENCOUNTER — Other Ambulatory Visit: Payer: Self-pay

## 2024-04-18 DIAGNOSIS — E538 Deficiency of other specified B group vitamins: Secondary | ICD-10-CM

## 2024-04-19 DIAGNOSIS — E538 Deficiency of other specified B group vitamins: Secondary | ICD-10-CM | POA: Diagnosis not present

## 2024-04-19 DIAGNOSIS — I129 Hypertensive chronic kidney disease with stage 1 through stage 4 chronic kidney disease, or unspecified chronic kidney disease: Secondary | ICD-10-CM | POA: Diagnosis not present

## 2024-04-19 DIAGNOSIS — E1165 Type 2 diabetes mellitus with hyperglycemia: Secondary | ICD-10-CM | POA: Diagnosis not present

## 2024-04-19 DIAGNOSIS — E039 Hypothyroidism, unspecified: Secondary | ICD-10-CM | POA: Diagnosis not present

## 2024-04-25 DIAGNOSIS — E039 Hypothyroidism, unspecified: Secondary | ICD-10-CM | POA: Diagnosis not present

## 2024-04-25 DIAGNOSIS — N183 Chronic kidney disease, stage 3 unspecified: Secondary | ICD-10-CM | POA: Diagnosis not present

## 2024-04-25 DIAGNOSIS — E1142 Type 2 diabetes mellitus with diabetic polyneuropathy: Secondary | ICD-10-CM | POA: Diagnosis not present

## 2024-04-25 DIAGNOSIS — I129 Hypertensive chronic kidney disease with stage 1 through stage 4 chronic kidney disease, or unspecified chronic kidney disease: Secondary | ICD-10-CM | POA: Diagnosis not present

## 2024-04-25 DIAGNOSIS — G894 Chronic pain syndrome: Secondary | ICD-10-CM | POA: Diagnosis not present

## 2024-04-25 DIAGNOSIS — E782 Mixed hyperlipidemia: Secondary | ICD-10-CM | POA: Diagnosis not present

## 2024-04-25 DIAGNOSIS — M545 Low back pain, unspecified: Secondary | ICD-10-CM | POA: Diagnosis not present

## 2024-04-25 DIAGNOSIS — I2510 Atherosclerotic heart disease of native coronary artery without angina pectoris: Secondary | ICD-10-CM | POA: Diagnosis not present

## 2024-04-25 DIAGNOSIS — E1165 Type 2 diabetes mellitus with hyperglycemia: Secondary | ICD-10-CM | POA: Diagnosis not present

## 2024-04-25 DIAGNOSIS — N1832 Chronic kidney disease, stage 3b: Secondary | ICD-10-CM | POA: Diagnosis not present

## 2024-04-25 DIAGNOSIS — K219 Gastro-esophageal reflux disease without esophagitis: Secondary | ICD-10-CM | POA: Diagnosis not present

## 2024-05-01 ENCOUNTER — Ambulatory Visit: Attending: Cardiology | Admitting: Cardiology

## 2024-05-01 ENCOUNTER — Encounter: Payer: Self-pay | Admitting: Cardiology

## 2024-05-01 NOTE — Progress Notes (Deleted)
 Clinical Summary [name] is a 78 y.o.male  seen today for follow up of the following medical problems.    1. PAF - noted during 10/2018 admission - had not been on anticoag due to frequent falls previously.    - since pacemaker placement falls have resolved, started on eliquis  5mg  bid.      No recent palpitations - compliant with meds. No bleeding one eliquis .    - no recent palpitations - compliant with meds - no bleeding on eliquis .    2. Bradycardia w/ pacemaker - chronic according ot notes. 10/2018 admissoin had some rates to 40s and 50s - heart monitor showed bradycardia and post termination pauses up to 3.2 seconds  06/26/19 pacemaker St Jude medical assurity dual chamber placed  04/2023 normal device check.  - no recent symptoms   3.Chest pain - extensive prior workup including cath in 2010 and stress test 2014 - admit 10/2018 with negative enzymes and echo - 04/2019 stress test no ischemia  11/2020 nuclear stress: inferior/inferoseptal infarct. Mild to mod peri-infarct ischemia apical.   -started on imdur  15 mg daily    - episodes about 2 times a month, steady pain mid to left chest. 5/10 in severity. Can occur at rest or with activity. Resolves with NG x 1.     4. Orthostatic dizziness -Improved since stopping lisinopril    - rare infrequent symptoms.   - working to stay well hydrated - appears back on lisinopril , would assume for his CKD>    - infrequent symptoms.       5. CKD -followed by nephrology - admit 06/2023 with pneumonia, AKI. His ACEi and SGTL2i were held at discharge         Prior cardiology notes   Prevoiusly followed by [facility] cardiology. From notes had normal cardiac monitor 2016. echocardiogram October 2014 ejection fraction 50-55%, G1DD, mild left atrial margin, 1+ AR, 1+ TR, no significant pericardial effusion -Echocardiogram interpreted by Dr. Lael [facility] office), September 2014, preserved ejection fraction, small  pericardial effusion, 1+ MR, 2+ AR  -Event monitor September 2014 sinus rhythm, occasional PVCs, nonsustained PAT, and one 7 beat run of a wide QRS complex at 110-120 bpm, with minimal changes QRS axis, and QRS morphology not similar to documented PVCs.  2. Heart catheterization 2010 nonobstructive coronary disease  -exercise Cardiolite stress test negative for ischemia with ejection fraction 61% October 2014 Past Medical History:  Diagnosis Date   Alcohol use    Anxiety    Chest pain    Coronary artery disease    a. Nonobstructive CAD by cath 2010 with negative nuclear stress test 05/2013.   Depression    Diabetes mellitus    Hyperlipidemia    Hypertension    Hypothyroidism    Kidney problem    a. 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   Mild aortic insufficiency    PAT (paroxysmal atrial tachycardia) (HCC)    Pericardial effusion    a. by echo 2014.   Pulmonary nodules    resolved   PVC's (premature ventricular contractions)    Stroke (cerebrum) (HCC)    Stroke due to embolism of left carotid artery (HCC) 03/24/2016   Left caudate head stroke   Wide-complex tachycardia    a. per [name] note: Event monitor September 2014 sinus rhythm, occasional PVCs, nonsustained PAT, and one 7 beat run of a wide QRS complex at 110-120 bpm, with minimal changes QRS axis, and QRS morphology not similar to documented  PVCs.      Allergies  Allergen Reactions   Morphine Other (See Comments)    Affected the heart- was told to not take this again   Tape Other (See Comments)    Certain tapes TEAR THE SKIN   Sulfonamide Derivatives Rash and Other (See Comments)    Blistering on the skin     Current Outpatient Medications  Medication Sig Dispense Refill   ACCU-CHEK GUIDE test strip USE TO TEST TWICE DAILY     acetaminophen  (TYLENOL ) 500 MG tablet Take 500 mg by mouth every 8 (eight) hours as needed for moderate pain or headache.     acetaminophen  (TYLENOL ) 500 MG tablet Take 2 tablets  (1,000 mg total) by mouth every 8 (eight) hours as needed. 30 tablet 0   albuterol  (VENTOLIN  HFA) 108 (90 Base) MCG/ACT inhaler Inhale 1 puff into the lungs every 6 (six) hours as needed for wheezing.     ALPRAZolam  (XANAX ) 1 MG tablet Take 1 tablet (1 mg total) by mouth 3 (three) times daily as needed for anxiety.     atorvastatin  (LIPITOR) 40 MG tablet Take 40 mg by mouth daily.     cyanocobalamin  (VITAMIN B12) 1000 MCG/ML injection Inject 1 mL (1,000 mcg total) into the skin every 30 (thirty) days. 1 mL 11   DULoxetine  (CYMBALTA ) 30 MG capsule Take 30 mg by mouth daily.     ELIQUIS  5 MG TABS tablet TAKE 1 TABLET BY MOUTH TWICE DAILY 60 tablet 5   gabapentin  (NEURONTIN ) 300 MG capsule Take 1 capsule (300 mg total) by mouth at bedtime as needed (neuropathy). (Patient taking differently: Take 300 mg by mouth at bedtime as needed (neuropathy). TID)     isosorbide  mononitrate (IMDUR ) 30 MG 24 hr tablet TAKE 1 TABLET BY MOUTH EVERY DAY 90 tablet 1   levothyroxine  (SYNTHROID ) 137 MCG tablet Take 137 mcg by mouth daily.     lidocaine  (LIDODERM ) 5 % Place 1 patch onto the skin daily. Remove & Discard patch within 12 hours or as directed by MD 30 patch 0   lisinopril  (ZESTRIL ) 2.5 MG tablet TAKE 1 TABLET BY MOUTH DAILY 90 tablet 1   nitroGLYCERIN  (NITROSTAT ) 0.4 MG SL tablet Place 0.4 mg under the tongue every 5 (five) minutes as needed for chest pain.      omega-3 acid ethyl esters (LOVAZA ) 1 g capsule Take 2 g by mouth 2 (two) times daily.  12   omeprazole (PRILOSEC) 40 MG capsule Take 40 mg by mouth daily before breakfast.     oxyCODONE  10 MG TABS Take 1 tablet (10 mg total) by mouth every 4 (four) hours as needed for severe pain (pain score 7-10). 10 tablet 0   oxyCODONE -acetaminophen  (PERCOCET) 10-325 MG tablet Take 1 tablet by mouth every 4 (four) hours as needed for pain.     TRELEGY ELLIPTA 100-62.5-25 MCG/ACT AEPB Inhale 1 puff into the lungs daily.     No current facility-administered  medications for this visit.     Past Surgical History:  Procedure Laterality Date   BACK SURGERY  1996   BREAST LUMPECTOMY Left 1979   CIRCUMCISION  1980   COLONOSCOPY  10/18/2014   Dr. Donnel, tubular adenoma in the ascending and distal transverse colon, hyperplastic polyp in sigmoid colon.  Recommended 3-5-year repeat.   COLONOSCOPY WITH PROPOFOL  N/A 03/26/2022   Surgeon: Shaaron Lamar HERO, MD; normal exam.  Repeat in 5 years due to personal history of polyps.   ESOPHAGOGASTRODUODENOSCOPY (EGD) WITH PROPOFOL   N/A 03/26/2022   Surgeon: Shaaron Lamar HERO, MD; Normal esophagus dilated, small hiatal hernia, normal stomach and examined duodenum.   EYE SURGERY     MALONEY DILATION N/A 03/26/2022   Procedure: AGAPITO DILATION;  Surgeon: Shaaron Lamar HERO, MD;  Location: [facility];  Service: Endoscopy;  Laterality: N/A;   MANDIBLE SURGERY Right 1969   PACEMAKER IMPLANT N/A 06/26/2019   Procedure: PACEMAKER IMPLANT;  Surgeon: Kelsie Agent, MD;  Location: [name];  Service: Cardiovascular;  Laterality: N/A;     Allergies  Allergen Reactions   Morphine Other (See Comments)    Affected the heart- was told to not take this again   Tape Other (See Comments)    Certain tapes TEAR THE SKIN   Sulfonamide Derivatives Rash and Other (See Comments)    Blistering on the skin      Family History  Problem Relation Age of Onset   Lung cancer Mother    Diabetes Father    Bladder Cancer Father    Colon cancer Brother        Age 47, passed at age 71   Stroke Maternal Grandmother    Cancer Maternal Grandfather    Stroke Paternal Grandmother    Stroke Paternal Grandfather    Colon cancer Maternal Aunt        71s   Colon cancer Cousin      Social History [name] reports that he quit smoking about 41 years ago. His smoking use included cigarettes. He has never used smokeless tobacco. [name] reports that he does not currently use alcohol.   Review of  Systems CONSTITUTIONAL: No weight loss, fever, chills, weakness or fatigue.  HEENT: Eyes: No visual loss, blurred vision, double vision or yellow sclerae.No hearing loss, sneezing, congestion, runny nose or sore throat.  SKIN: No rash or itching.  CARDIOVASCULAR:  RESPIRATORY: No shortness of breath, cough or sputum.  GASTROINTESTINAL: No anorexia, nausea, vomiting or diarrhea. No abdominal pain or blood.  GENITOURINARY: No burning on urination, no polyuria NEUROLOGICAL: No headache, dizziness, syncope, paralysis, ataxia, numbness or tingling in the extremities. No change in bowel or bladder control.  MUSCULOSKELETAL: No muscle, back pain, joint pain or stiffness.  LYMPHATICS: No enlarged nodes. No history of splenectomy.  PSYCHIATRIC: No history of depression or anxiety.  ENDOCRINOLOGIC: No reports of sweating, cold or heat intolerance. No polyuria or polydipsia.  SABRA   Physical Examination There were no vitals filed for this visit. There were no vitals filed for this visit.  Gen: resting comfortably, no acute distress HEENT: no scleral icterus, pupils equal round and reactive, no palptable cervical adenopathy,  CV Resp: Clear to auscultation bilaterally GI: abdomen is soft, non-tender, non-distended, normal bowel sounds, no hepatosplenomegaly MSK: extremities are warm, no edema.  Skin: warm, no rash Neuro:  no focal deficits Psych: appropriate affect   Diagnostic Studies  01/2019 event monitor 7 day event monitor. Date is available from 83% of planned monitored time Min HR 52, Max HR 111, Avg HR 65 Sinus rhythm with episodes of aflutter with variable conduction, no significant RVR. Can have postconversion pause up to 3.2 seconds. No symptoms reported     10/2018 echo IMPRESSIONS     1. The left ventricle has normal systolic function with an ejection  fraction of 60-65%. The cavity size was normal. There is mild concentric  left ventricular hypertrophy. Left ventricular  diastolic Doppler  parameters are consistent with impaired  relaxation. Indeterminate filling pressures No evidence  of left  ventricular regional wall motion abnormalities.   2. The right ventricle has normal systolic function. The cavity was  normal. There is no increase in right ventricular wall thickness.   3. The tricuspid valve is grossly normal.   4. The aortic valve is tricuspid Aortic valve regurgitation is mild by  color flow Doppler.   5. There is mild dilatation of the aortic root measuring 38 mm.   6. The interatrial septum was not well visualized.      04/2019 nuclear stress There was no ST segment deviation noted during stress. Findings consistent with prior inferior/inferoseptal/inferoapical myocardial infarction. No current ischemia This is a low risk study. The left ventricular ejection fraction is normal (55-65%).   11/2020 echo  1. Left ventricular ejection fraction, by estimation, is 55 to 60%. The  left ventricle has normal function. The left ventricle has no regional  wall motion abnormalities. There is mild left ventricular hypertrophy.  Left ventricular diastolic parameters  are consistent with Grade I diastolic dysfunction (impaired relaxation).  Elevated left atrial pressure.   2. Right ventricular systolic function is normal. The right ventricular  size is normal.   3. The pericardial effusion is circumferential.   4. The mitral valve is normal in structure. No evidence of mitral valve  regurgitation. No evidence of mitral stenosis.   5. The aortic valve is tricuspid. Aortic valve regurgitation is mild. No  aortic stenosis is present.   6. The inferior vena cava is normal in size with greater than 50%  respiratory variability, suggesting right atrial pressure of 3 mmHg.     11/2020 nuclear stress There was no ST segment deviation noted during stress. Findings consistent with prior inferior/inferoseptal myocardial infarction. There is a small apical  infarct with mild to moderate peri-infarct ischemia. The left ventricular ejection fraction is normal (55-65%). Low to intermediate risk study.     Assessment and Plan   1. Chest pain - long history of chest pain symptoms with negative workups in the past including cath, multiple stress tests  - most recent stress test low to intermediate risk, working with medical therapy to manage symptoms -infrequent symptoms, continue to treat medically at this time. High threshold to consider cath given renal dysfunction.      2. Afib/acquired thrombophilia -no symptoms, continue current meds incliudkng    3.Pacemaker - normal device check recently, continue to monitor - no symptoms   4. Hyperlipidemia - has been at goal,, continue current meds     Dorn PHEBE Ross, M.D., F.A.C.C.

## 2024-05-02 DIAGNOSIS — E538 Deficiency of other specified B group vitamins: Secondary | ICD-10-CM | POA: Diagnosis not present

## 2024-05-02 LAB — VITAMIN B12: Vitamin B-12: 384 pg/mL (ref 200–1100)

## 2024-05-06 ENCOUNTER — Ambulatory Visit: Payer: Self-pay | Admitting: Gastroenterology

## 2024-05-21 ENCOUNTER — Other Ambulatory Visit: Payer: Self-pay | Admitting: Cardiology

## 2024-05-21 DIAGNOSIS — I480 Paroxysmal atrial fibrillation: Secondary | ICD-10-CM

## 2024-05-21 NOTE — Telephone Encounter (Signed)
 Prescription refill request for Eliquis  received. Indication: AF Last office visit: 07/12/23  Ronald Ross MD Scr: 1.65 on 07/03/23  Epic Age: 78 Weight: 85.1kg  Based on above findings Eliquis  5mg  twice daily is the appropriate dose.  Refill approved.

## 2024-05-23 LAB — CUP PACEART REMOTE DEVICE CHECK
Battery Remaining Longevity: 82 mo
Battery Remaining Percentage: 67 %
Battery Voltage: 2.99 V
Brady Statistic AP VP Percent: 1 %
Brady Statistic AP VS Percent: 27 %
Brady Statistic AS VP Percent: 1 %
Brady Statistic AS VS Percent: 73 %
Brady Statistic RA Percent Paced: 26 %
Brady Statistic RV Percent Paced: 1 %
Date Time Interrogation Session: 20250626020033
Implantable Lead Connection Status: 753985
Implantable Lead Connection Status: 753985
Implantable Lead Implant Date: 20201103
Implantable Lead Implant Date: 20201103
Implantable Lead Location: 753859
Implantable Lead Location: 753860
Implantable Pulse Generator Implant Date: 20201103
Lead Channel Impedance Value: 430 Ohm
Lead Channel Impedance Value: 490 Ohm
Lead Channel Pacing Threshold Amplitude: 0.5 V
Lead Channel Pacing Threshold Amplitude: 0.75 V
Lead Channel Pacing Threshold Pulse Width: 0.5 ms
Lead Channel Pacing Threshold Pulse Width: 0.5 ms
Lead Channel Sensing Intrinsic Amplitude: 12 mV
Lead Channel Sensing Intrinsic Amplitude: 2.3 mV
Lead Channel Setting Pacing Amplitude: 2 V
Lead Channel Setting Pacing Amplitude: 2.5 V
Lead Channel Setting Pacing Pulse Width: 0.5 ms
Lead Channel Setting Sensing Sensitivity: 2 mV
Pulse Gen Model: 2272
Pulse Gen Serial Number: 9174806

## 2024-05-24 NOTE — Progress Notes (Signed)
 Remote PPM Transmission

## 2024-05-25 ENCOUNTER — Ambulatory Visit: Attending: Cardiovascular Disease | Admitting: Cardiovascular Disease

## 2024-05-25 ENCOUNTER — Encounter: Payer: Self-pay | Admitting: Cardiovascular Disease

## 2024-05-25 VITALS — BP 106/64 | HR 68 | Ht 68.0 in | Wt 181.8 lb

## 2024-05-25 DIAGNOSIS — I480 Paroxysmal atrial fibrillation: Secondary | ICD-10-CM | POA: Diagnosis not present

## 2024-05-25 NOTE — Progress Notes (Signed)
    PCP: Shona Norleen PEDLAR, MD Primary Cardiologist: Dr Alvan Primary EP:  Dr Nancey  HUIE [name] is a 78 y.o. male who presents today for routine electrophysiology followup.      He has a Presenter, broadcasting pacemaker placed in November 2020 by Dr. Kelsie for sinus bradycardia.  Prior to the pacemaker placement he was having frequent falls.  He has chronic stable chest pain.  He recently saw Dr Alvan for this.  He has had an extensive workup over the years.        Today, he denies symptoms of palpitations,  shortness of breath,  lower extremity edema, dizziness, presyncope, or syncope. He hasn't had any issues with chest pain in awhile.  he has no device related complaints -- no new tenderness, drainage, redness.The patient is otherwise without complaint today.     Physical Exam: Vitals:   05/25/24 1437  BP: 106/64  Pulse: 68  SpO2: 98%  Weight: 181 lb 12.8 oz (82.5 kg)  Height: 5' 8 (1.727 m)    Gen: Appears comfortable, well-nourished CV: RRR, no dependent edema The device site is normal -- no tenderness, edema, drainage, redness, threatened erosion. Pulm: breathing easily   Pacemaker interrogation- reviewed in detail today,  See PACEART report    Assessment and Plan:  1. Symptomatic sinus bradycardia with post termination pauses Normal pacemaker function See Pace Art report No changes today he is not device dependant today  2. Paroxysmal atrial fibrillation asymptomatic Afib burden is 4.2% % (previously 12%, prior 6.2%) On eliquis  5  3. HTN Stable No change required today   4. Chest pain This is a very chronic issue but he hasn't had any issues recently   Return for further device follow-up in a year  Eulas FORBES Nancey, MD 05/25/2024 3:06 PM

## 2024-05-25 NOTE — Patient Instructions (Addendum)

## 2024-05-28 ENCOUNTER — Ambulatory Visit (INDEPENDENT_AMBULATORY_CARE_PROVIDER_SITE_OTHER)

## 2024-05-28 DIAGNOSIS — I495 Sick sinus syndrome: Secondary | ICD-10-CM | POA: Diagnosis not present

## 2024-05-29 ENCOUNTER — Ambulatory Visit: Payer: Self-pay | Admitting: Cardiovascular Disease

## 2024-05-29 LAB — CUP PACEART REMOTE DEVICE CHECK
Battery Remaining Longevity: 81 mo
Battery Remaining Percentage: 65 %
Battery Voltage: 2.99 V
Brady Statistic AP VP Percent: 1 %
Brady Statistic AP VS Percent: 11 %
Brady Statistic AS VP Percent: 1 %
Brady Statistic AS VS Percent: 89 %
Brady Statistic RA Percent Paced: 11 %
Brady Statistic RV Percent Paced: 1 %
Date Time Interrogation Session: 20251006170015
Implantable Lead Connection Status: 753985
Implantable Lead Connection Status: 753985
Implantable Lead Implant Date: 20201103
Implantable Lead Implant Date: 20201103
Implantable Lead Location: 753859
Implantable Lead Location: 753860
Implantable Pulse Generator Implant Date: 20201103
Lead Channel Impedance Value: 430 Ohm
Lead Channel Impedance Value: 460 Ohm
Lead Channel Pacing Threshold Amplitude: 0.75 V
Lead Channel Pacing Threshold Amplitude: 0.75 V
Lead Channel Pacing Threshold Pulse Width: 0.5 ms
Lead Channel Pacing Threshold Pulse Width: 0.5 ms
Lead Channel Sensing Intrinsic Amplitude: 2.4 mV
Lead Channel Sensing Intrinsic Amplitude: 9.1 mV
Lead Channel Setting Pacing Amplitude: 2 V
Lead Channel Setting Pacing Amplitude: 2.5 V
Lead Channel Setting Pacing Pulse Width: 0.5 ms
Lead Channel Setting Sensing Sensitivity: 2 mV
Pulse Gen Model: 2272
Pulse Gen Serial Number: 9174806

## 2024-05-29 NOTE — Progress Notes (Signed)
 Remote PPM Transmission

## 2024-06-04 ENCOUNTER — Ambulatory Visit: Payer: Self-pay | Admitting: Cardiovascular Disease

## 2024-06-17 NOTE — Progress Notes (Unsigned)
 GI Office Note    Referring Provider: Shona Norleen PEDLAR, MD Primary Care Physician:  Shona Norleen PEDLAR, MD  Primary Gastroenterologist: Ozell Hollingshead, MD   Chief Complaint   No chief complaint on file.   History of Present Illness   [name] is a 78 y.o. male presenting today for follow up. Last seen in 2023. We started him on B12 shots in 02/2023. He had recent B12 level which was 384. He is no longer taking shots. In 02/2023, his B12 level was 114.   H/o GERD, dysphagia, constipation, abdominal pain.   Prior Data    EGD completed 03/26/2022 revealed normal esophagus s/p empiric dilation. 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    Colonoscopy 01/2022: - The entire examined colon is normal. - The distal rectum and anal verge are normal on retroflexion view. Redundant colon. - No specimens collected.     Medications   Current Outpatient Medications  Medication Sig Dispense Refill   ACCU-CHEK GUIDE test strip USE TO TEST TWICE DAILY     acetaminophen  (TYLENOL ) 500 MG tablet Take 500 mg by mouth every 8 (eight) hours as needed for moderate pain or headache.     acetaminophen  (TYLENOL ) 500 MG tablet Take 2 tablets (1,000 mg total) by mouth every 8 (eight) hours as needed. 30 tablet 0   albuterol  (VENTOLIN  HFA) 108 (90 Base) MCG/ACT inhaler Inhale 1 puff into the lungs every 6 (six) hours as needed for wheezing.     ALPRAZolam  (XANAX ) 1 MG tablet Take 1 tablet (1 mg total) by mouth 3 (three) times daily as needed for anxiety.     atorvastatin  (LIPITOR) 40 MG tablet Take 40 mg by mouth daily.     cyanocobalamin  (VITAMIN B12) 1000 MCG tablet Take 1,000 mcg by mouth daily.     DULoxetine  (CYMBALTA ) 30 MG capsule Take 30 mg by mouth daily.     ELIQUIS  5 MG TABS tablet TAKE 1 TABLET BY MOUTH TWICE DAILY 60 tablet 5   FARXIGA  10 MG TABS tablet Take 10 mg by mouth daily.     gabapentin  (NEURONTIN ) 300 MG capsule Take 1 capsule (300 mg total) by mouth at bedtime as needed  (neuropathy).     glimepiride  (AMARYL ) 2 MG tablet Take 2 mg by mouth daily with breakfast.     isosorbide  mononitrate (IMDUR ) 30 MG 24 hr tablet TAKE 1 TABLET BY MOUTH EVERY DAY 90 tablet 1   levothyroxine  (SYNTHROID ) 137 MCG tablet Take 137 mcg by mouth daily.     lidocaine  (LIDODERM ) 5 % Place 1 patch onto the skin daily. Remove & Discard patch within 12 hours or as directed by MD (Patient not taking: Reported on 05/25/2024) 30 patch 0   lisinopril  (ZESTRIL ) 2.5 MG tablet TAKE 1 TABLET BY MOUTH DAILY 90 tablet 1   nitroGLYCERIN  (NITROSTAT ) 0.4 MG SL tablet Place 0.4 mg under the tongue every 5 (five) minutes as needed for chest pain.      omega-3 acid ethyl esters (LOVAZA ) 1 g capsule Take 2 g by mouth 2 (two) times daily.  12   omeprazole (PRILOSEC) 40 MG capsule Take 40 mg by mouth daily before breakfast.     oxyCODONE  10 MG TABS Take 1 tablet (10 mg total) by mouth every 4 (four) hours as needed for severe pain (pain score 7-10). 10 tablet 0   oxyCODONE -acetaminophen  (PERCOCET) 10-325 MG tablet Take 1 tablet by mouth every 4 (four) hours as needed for pain.  TRELEGY ELLIPTA 100-62.5-25 MCG/ACT AEPB Inhale 1 puff into the lungs daily.     No current facility-administered medications for this visit.    Allergies   Allergies as of 06/18/2024 - Review Complete 05/25/2024  Allergen Reaction Noted   Morphine Other (See Comments) 05/23/2013   Tape Other (See Comments) 08/11/2020   Sulfonamide derivatives Rash and Other (See Comments) 01/09/2009     Past Medical History   Past Medical History:  Diagnosis Date   Alcohol use    Anxiety    Chest pain    Coronary artery disease    a. Nonobstructive CAD by cath 2010 with negative nuclear stress test 05/2013.   Depression    Diabetes mellitus    Hyperlipidemia    Hypertension    Hypothyroidism    Kidney problem    a. 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   Mild aortic insufficiency    PAT (paroxysmal atrial tachycardia)     Pericardial effusion    a. by echo 2014.   Pulmonary nodules    resolved   PVC's (premature ventricular contractions)    Stroke (cerebrum) (HCC)    Stroke due to embolism of left carotid artery (HCC) 03/24/2016   Left caudate head stroke   Wide-complex tachycardia    a. per [name] note: Event monitor September 2014 sinus rhythm, occasional PVCs, nonsustained PAT, and one 7 beat run of a wide QRS complex at 110-120 bpm, with minimal changes QRS axis, and QRS morphology not similar to documented PVCs.     Past Surgical History   Past Surgical History:  Procedure Laterality Date   BACK SURGERY  1996   BREAST LUMPECTOMY Left 1979   CIRCUMCISION  1980   COLONOSCOPY  10/18/2014   Dr. Donnel, tubular adenoma in the ascending and distal transverse colon, hyperplastic polyp in sigmoid colon.  Recommended 3-5-year repeat.   COLONOSCOPY WITH PROPOFOL  N/A 03/26/2022   Surgeon: Shaaron Lamar HERO, MD; normal exam.  Repeat in 5 years due to personal history of polyps.   ESOPHAGOGASTRODUODENOSCOPY (EGD) WITH PROPOFOL  N/A 03/26/2022   Surgeon: Shaaron Lamar HERO, MD; Normal esophagus dilated, small hiatal hernia, normal stomach and examined duodenum.   EYE SURGERY     MALONEY DILATION N/A 03/26/2022   Procedure: AGAPITO DILATION;  Surgeon: Shaaron Lamar HERO, MD;  [facility];  Service: Endoscopy;  Laterality: N/A;   MANDIBLE SURGERY Right 1969   PACEMAKER IMPLANT N/A 06/26/2019   Procedure: PACEMAKER IMPLANT;  Surgeon: Kelsie Agent, MD;  Location: [name];  Service: Cardiovascular;  Laterality: N/A;    Past Family History   Family History  Problem Relation Age of Onset   Lung cancer Mother    Diabetes Father    Bladder Cancer Father    Colon cancer Brother        Age 77, passed at age 15   Stroke Maternal Grandmother    Cancer Maternal Grandfather    Stroke Paternal Grandmother    Stroke Paternal Grandfather    Colon cancer Maternal Aunt        83s   Colon cancer Cousin      Past Social History   Social History   Socioeconomic History   Marital status: Legally Separated    Spouse name: Apolinar   Number of children: 3   Years of education: 10   Highest education level: Not on file  Occupational History   Occupation: Retired  Tobacco Use   Smoking status: Former  Current packs/day: 0.00    Types: Cigarettes    Quit date: 21    Years since quitting: 41.8   Smokeless tobacco: Never  Vaping Use   Vaping status: Never Used  Substance and Sexual Activity   Alcohol use: Not Currently   Drug use: Not Currently    Types: Marijuana   Sexual activity: Not Currently  Other Topics Concern   Not on file  Social History Narrative   Lives at home w/ his wife   Right-handed   Caffeine: 2-3 cups of coffee each morning   Social Drivers of Corporate Investment Banker Strain: Not on file  Food Insecurity: No Food Insecurity (07/02/2023)   Hunger Vital Sign    Worried About Running Out of Food in the Last Year: Never true    Ran Out of Food in the Last Year: Never true  Transportation Needs: No Transportation Needs (07/02/2023)   PRAPARE - Administrator, Civil Service (Medical): No    Lack of Transportation (Non-Medical): No  Physical Activity: Not on file  Stress: Not on file  Social Connections: Not on file  Intimate Partner Violence: Not At Risk (07/02/2023)   Humiliation, Afraid, Rape, and Kick questionnaire    Fear of Current or Ex-Partner: No    Emotionally Abused: No    Physically Abused: No    Sexually Abused: No    Review of Systems   General: Negative for anorexia, weight loss, fever, chills, fatigue, weakness. ENT: Negative for hoarseness, difficulty swallowing , nasal congestion. CV: Negative for chest pain, angina, palpitations, dyspnea on exertion, peripheral edema.  Respiratory: Negative for dyspnea at rest, dyspnea on exertion, cough, sputum, wheezing.  GI: See history of present illness. GU:  Negative for dysuria,  hematuria, urinary incontinence, urinary frequency, nocturnal urination.  Endo: Negative for unusual weight change.     Physical Exam   There were no vitals taken for this visit.   General: Well-nourished, well-developed in no acute distress.  Eyes: No icterus. Mouth: Oropharyngeal mucosa moist and pink   Lungs: Clear to auscultation bilaterally.  Heart: Regular rate and rhythm, no murmurs rubs or gallops.  Abdomen: Bowel sounds are normal, nontender, nondistended, no hepatosplenomegaly or masses,  no abdominal bruits or hernia , no rebound or guarding.  Rectal: not performed Extremities: No lower extremity edema. No clubbing or deformities. Neuro: Alert and oriented x 4   Skin: Warm and dry, no jaundice.   Psych: Alert and cooperative, normal mood and affect.  Labs   03/2024 Labs ***  Lab Results  Component Value Date   VITAMINB12 384 05/02/2024     Imaging Studies   CUP PACEART REMOTE DEVICE CHECK Result Date: 05/29/2024 PPM Scheduled remote reviewed. Normal device function.  Presenting rhythm: AP-AS/VS. Next remote transmission per protocol. [facility], [facility]   Assessment/Plan:           Sonny RAMAN. Ezzard, MHS, PA-C [facility]

## 2024-06-18 ENCOUNTER — Encounter: Payer: Self-pay | Admitting: Gastroenterology

## 2024-06-18 ENCOUNTER — Ambulatory Visit (INDEPENDENT_AMBULATORY_CARE_PROVIDER_SITE_OTHER): Admitting: Gastroenterology

## 2024-06-18 VITALS — BP 113/63 | HR 60 | Temp 98.4°F | Ht 69.0 in | Wt 184.4 lb

## 2024-06-18 DIAGNOSIS — E538 Deficiency of other specified B group vitamins: Secondary | ICD-10-CM | POA: Insufficient documentation

## 2024-06-18 DIAGNOSIS — K5909 Other constipation: Secondary | ICD-10-CM

## 2024-06-18 DIAGNOSIS — K219 Gastro-esophageal reflux disease without esophagitis: Secondary | ICD-10-CM | POA: Diagnosis not present

## 2024-06-18 DIAGNOSIS — K5900 Constipation, unspecified: Secondary | ICD-10-CM

## 2024-06-18 MED ORDER — TRULANCE 3 MG PO TABS: 3.0000 mg | ORAL_TABLET | Freq: Every day | ORAL | 5 refills | Status: AC

## 2024-06-18 NOTE — Patient Instructions (Addendum)
 Take lab order and have labs done with Dr. Shona in December.   You can take vitamin B12 1000-2000 mcg by mouth daily.  Continue omeprazole daily for acid reflux.  Start Trulance 3mg  daily for constipation. RX sent to pharmacy.  Please let me know if you are having any abdominal pain or no improvement in your constipation.   Return office visit in six months.

## 2024-06-22 ENCOUNTER — Other Ambulatory Visit: Payer: Self-pay | Admitting: Cardiology

## 2024-07-06 ENCOUNTER — Encounter: Payer: Self-pay | Admitting: Cardiology

## 2024-07-06 ENCOUNTER — Ambulatory Visit: Attending: Cardiology | Admitting: Cardiology

## 2024-07-06 VITALS — BP 130/68 | HR 74 | Ht 68.0 in | Wt 181.4 lb

## 2024-07-06 DIAGNOSIS — R0789 Other chest pain: Secondary | ICD-10-CM | POA: Diagnosis not present

## 2024-07-06 DIAGNOSIS — R0609 Other forms of dyspnea: Secondary | ICD-10-CM | POA: Diagnosis not present

## 2024-07-06 DIAGNOSIS — D6869 Other thrombophilia: Secondary | ICD-10-CM

## 2024-07-06 DIAGNOSIS — I480 Paroxysmal atrial fibrillation: Secondary | ICD-10-CM

## 2024-07-06 DIAGNOSIS — Z950 Presence of cardiac pacemaker: Secondary | ICD-10-CM | POA: Diagnosis not present

## 2024-07-06 DIAGNOSIS — E782 Mixed hyperlipidemia: Secondary | ICD-10-CM

## 2024-07-06 NOTE — Patient Instructions (Signed)
 Medication Instructions:   Continue all current medications.   Labwork:  none  Testing/Procedures:  Your physician has requested that you have an echocardiogram. Echocardiography is a painless test that uses sound waves to create images of your heart. It provides your doctor with information about the size and shape of your heart and how well your heart's chambers and valves are working. This procedure takes approximately one hour. There are no restrictions for this procedure. Please do NOT wear cologne, perfume, aftershave, or lotions (deodorant is allowed). Please arrive 15 minutes prior to your appointment time.  Please note: We ask at that you not bring children with you during ultrasound (echo/ vascular) testing. Due to room size and safety concerns, children are not allowed in the ultrasound rooms during exams. Our front office staff cannot provide observation of children in our lobby area while testing is being conducted. An adult accompanying a patient to their appointment will only be allowed in the ultrasound room at the discretion of the ultrasound technician under special circumstances. We apologize for any inconvenience. Office will contact with results via phone, letter or mychart.     Follow-Up:  4 months   Any Other Special Instructions Will Be Listed Below (If Applicable).   If you need a refill on your cardiac medications before your next appointment, please call your pharmacy.

## 2024-07-06 NOTE — Progress Notes (Signed)
 Clinical Summary [name] is a 78 y.o.male seen today for follow up of the following medical problems.    1. PAF - noted during 10/2018 admission - had not been on anticoag due to frequent falls previously.    - since pacemaker placement falls have resolved, started on eliquis  5mg  bid.     - no recent palpitations - compliant with meds. No bleeding on eliquis      2. Bradycardia w/ pacemaker - chronic according ot notes. 10/2018 admissoin had some rates to 40s and 50s - heart monitor showed bradycardia and post termination pauses up to 3.2 seconds  06/26/19 pacemaker St Jude medical assurity dual chamber placed  05/2024 device check: normal device function - no dizziness   3.Chest pain - extensive prior workup including cath in 2010 and stress test 2014 - admit 10/2018 with negative enzymes and echo - 04/2019 stress test no ischemia  11/2020 nuclear stress: inferior/inferoseptal infarct. Mild to mod peri-infarct ischemia apical.   -started on imdur  15 mg daily    - episodes about 2 times a month, steady pain mid to left chest. 5/10 in severity. Can occur at rest or with activity. Resolves with NG x 1.   - similar infrequent symptoms.     4. Orthostatic dizziness -Improved since stopping lisinopril    - rare infrequent symptoms.   - working to stay well hydrated - appears back on lisinopril , would assume for his CKD>    - no recent symptoms.       5. CKD -followed by nephrology - admit 06/2023 with pneumonia, AKI. His ACEi and SGTL2i were held at discharge     6. DOE - DOE with walking room to room at home - no recent edema.    Prior cardiology notes   Prevoiusly followed by [facility] cardiology. From notes had normal cardiac monitor 2016. echocardiogram October 2014 ejection fraction 50-55%, G1DD, mild left atrial margin, 1+ AR, 1+ TR, no significant pericardial effusion -Echocardiogram interpreted by Dr. Lael [facility] office), September 2014, preserved  ejection fraction, small pericardial effusion, 1+ MR, 2+ AR  -Event monitor September 2014 sinus rhythm, occasional PVCs, nonsustained PAT, and one 7 beat run of a wide QRS complex at 110-120 bpm, with minimal changes QRS axis, and QRS morphology not similar to documented PVCs.  2. Heart catheterization 2010 nonobstructive coronary disease  -exercise Cardiolite stress test negative for ischemia with ejection fraction 61% October 2014 Past Medical History:  Diagnosis Date   Alcohol use    Anxiety    Chest pain    Coronary artery disease    a. Nonobstructive CAD by cath 2010 with negative nuclear stress test 05/2013.   Depression    Diabetes mellitus    Hyperlipidemia    Hypertension    Hypothyroidism    Kidney problem    a. 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   Mild aortic insufficiency    PAT (paroxysmal atrial tachycardia)    Pericardial effusion    a. by echo 2014.   Pulmonary nodules    resolved   PVC's (premature ventricular contractions)    Stroke (cerebrum) (HCC)    Stroke due to embolism of left carotid artery (HCC) 03/24/2016   Left caudate head stroke   Wide-complex tachycardia    a. per [name] note: Event monitor September 2014 sinus rhythm, occasional PVCs, nonsustained PAT, and one 7 beat run of a wide QRS complex at 110-120 bpm, with minimal changes QRS axis, and QRS morphology  not similar to documented PVCs.      Allergies  Allergen Reactions   Morphine Other (See Comments)    Affected the heart- was told to not take this again   Tape Other (See Comments)    Certain tapes TEAR THE SKIN   Sulfonamide Derivatives Rash and Other (See Comments)    Blistering on the skin     Current Outpatient Medications  Medication Sig Dispense Refill   ACCU-CHEK GUIDE test strip USE TO TEST TWICE DAILY     acetaminophen  (TYLENOL ) 500 MG tablet Take 500 mg by mouth every 8 (eight) hours as needed for moderate pain or headache.     acetaminophen  (TYLENOL ) 500 MG tablet  Take 2 tablets (1,000 mg total) by mouth every 8 (eight) hours as needed. 30 tablet 0   albuterol  (VENTOLIN  HFA) 108 (90 Base) MCG/ACT inhaler Inhale 1 puff into the lungs every 6 (six) hours as needed for wheezing.     ALPRAZolam  (XANAX ) 1 MG tablet Take 1 tablet (1 mg total) by mouth 3 (three) times daily as needed for anxiety.     atorvastatin  (LIPITOR) 40 MG tablet Take 40 mg by mouth daily.     cyanocobalamin  (VITAMIN B12) 1000 MCG tablet Take 1,000 mcg by mouth daily.     DULoxetine  (CYMBALTA ) 30 MG capsule Take 30 mg by mouth daily.     ELIQUIS  5 MG TABS tablet TAKE 1 TABLET BY MOUTH TWICE DAILY 60 tablet 5   FARXIGA  10 MG TABS tablet Take 10 mg by mouth daily.     gabapentin  (NEURONTIN ) 300 MG capsule Take 1 capsule (300 mg total) by mouth at bedtime as needed (neuropathy).     glimepiride  (AMARYL ) 2 MG tablet Take 2 mg by mouth daily with breakfast.     isosorbide  mononitrate (IMDUR ) 30 MG 24 hr tablet TAKE 1 TABLET BY MOUTH EVERY DAY 90 tablet 0   levothyroxine  (SYNTHROID ) 137 MCG tablet Take 137 mcg by mouth daily.     lidocaine  (LIDODERM ) 5 % Place 1 patch onto the skin daily. Remove & Discard patch within 12 hours or as directed by MD (Patient not taking: Reported on 06/18/2024) 30 patch 0   lisinopril  (ZESTRIL ) 2.5 MG tablet TAKE 1 TABLET BY MOUTH DAILY 90 tablet 0   nitroGLYCERIN  (NITROSTAT ) 0.4 MG SL tablet Place 0.4 mg under the tongue every 5 (five) minutes as needed for chest pain.      omega-3 acid ethyl esters (LOVAZA ) 1 g capsule Take 2 g by mouth 2 (two) times daily.  12   omeprazole (PRILOSEC) 40 MG capsule Take 40 mg by mouth daily before breakfast.     oxyCODONE  10 MG TABS Take 1 tablet (10 mg total) by mouth every 4 (four) hours as needed for severe pain (pain score 7-10). 10 tablet 0   oxyCODONE -acetaminophen  (PERCOCET) 10-325 MG tablet Take 1 tablet by mouth every 4 (four) hours as needed for pain.     Plecanatide (TRULANCE) 3 MG TABS Take 1 tablet (3 mg total) by  mouth daily. 30 tablet 5   TRELEGY ELLIPTA 100-62.5-25 MCG/ACT AEPB Inhale 1 puff into the lungs daily.     No current facility-administered medications for this visit.     Past Surgical History:  Procedure Laterality Date   BACK SURGERY  1996   BREAST LUMPECTOMY Left 1979   CIRCUMCISION  1980   COLONOSCOPY  10/18/2014   Dr. Donnel, tubular adenoma in the ascending and distal transverse colon, hyperplastic polyp in  sigmoid colon.  Recommended 3-5-year repeat.   COLONOSCOPY WITH PROPOFOL  N/A 03/26/2022   Surgeon: Shaaron Lamar HERO, MD; normal exam.  Repeat in 5 years due to personal history of polyps.   ESOPHAGOGASTRODUODENOSCOPY (EGD) WITH PROPOFOL  N/A 03/26/2022   Surgeon: Shaaron Lamar HERO, MD; Normal esophagus dilated, small hiatal hernia, normal stomach and examined duodenum.   EYE SURGERY     MALONEY DILATION N/A 03/26/2022   Procedure: AGAPITO DILATION;  Surgeon: Shaaron Lamar HERO, MD;  [facility];  Service: Endoscopy;  Laterality: N/A;   MANDIBLE SURGERY Right 1969   PACEMAKER IMPLANT N/A 06/26/2019   Procedure: PACEMAKER IMPLANT;  Surgeon: Kelsie Agent, MD;  Location: [name];  Service: Cardiovascular;  Laterality: N/A;     Allergies  Allergen Reactions   Morphine Other (See Comments)    Affected the heart- was told to not take this again   Tape Other (See Comments)    Certain tapes TEAR THE SKIN   Sulfonamide Derivatives Rash and Other (See Comments)    Blistering on the skin      Family History  Problem Relation Age of Onset   Lung cancer Mother    Diabetes Father    Bladder Cancer Father    Colon cancer Brother        Age 34, passed at age 47   Stroke Maternal Grandmother    Cancer Maternal Grandfather    Stroke Paternal Grandmother    Stroke Paternal Grandfather    Colon cancer Maternal Aunt        40s   Colon cancer Cousin      Social History [name] reports that he quit smoking about 41 years ago. His smoking use included  cigarettes. He has never used smokeless tobacco. [name] reports that he does not currently use alcohol.    Physical Examination Today's Vitals   07/06/24 1016  BP: 130/68  Pulse: 74  SpO2: 95%  Weight: 181 lb 6.4 oz (82.3 kg)  Height: 5' 8 (1.727 m)   Body mass index is 27.58 kg/m.  Gen: resting comfortably, no acute distress HEENT: no scleral icterus, pupils equal round and reactive, no palptable cervical adenopathy,  CV: RRR, no m/rg, no jvd Resp: Clear to auscultation bilaterally GI: abdomen is soft, non-tender, non-distended, normal bowel sounds, no hepatosplenomegaly MSK: extremities are warm, no edema.  Skin: warm, no rash Neuro:  no focal deficits Psych: appropriate affect   Diagnostic Studies  01/2019 event monitor 7 day event monitor. Date is available from 83% of planned monitored time Min HR 52, Max HR 111, Avg HR 65 Sinus rhythm with episodes of aflutter with variable conduction, no significant RVR. Can have postconversion pause up to 3.2 seconds. No symptoms reported     10/2018 echo IMPRESSIONS     1. The left ventricle has normal systolic function with an ejection  fraction of 60-65%. The cavity size was normal. There is mild concentric  left ventricular hypertrophy. Left ventricular diastolic Doppler  parameters are consistent with impaired  relaxation. Indeterminate filling pressures No evidence of left  ventricular regional wall motion abnormalities.   2. The right ventricle has normal systolic function. The cavity was  normal. There is no increase in right ventricular wall thickness.   3. The tricuspid valve is grossly normal.   4. The aortic valve is tricuspid Aortic valve regurgitation is mild by  color flow Doppler.   5. There is mild dilatation of the aortic root measuring 38  mm.   6. The interatrial septum was not well visualized.      04/2019 nuclear stress There was no ST segment deviation noted during stress. Findings  consistent with prior inferior/inferoseptal/inferoapical myocardial infarction. No current ischemia This is a low risk study. The left ventricular ejection fraction is normal (55-65%).   11/2020 echo  1. Left ventricular ejection fraction, by estimation, is 55 to 60%. The  left ventricle has normal function. The left ventricle has no regional  wall motion abnormalities. There is mild left ventricular hypertrophy.  Left ventricular diastolic parameters  are consistent with Grade I diastolic dysfunction (impaired relaxation).  Elevated left atrial pressure.   2. Right ventricular systolic function is normal. The right ventricular  size is normal.   3. The pericardial effusion is circumferential.   4. The mitral valve is normal in structure. No evidence of mitral valve  regurgitation. No evidence of mitral stenosis.   5. The aortic valve is tricuspid. Aortic valve regurgitation is mild. No  aortic stenosis is present.   6. The inferior vena cava is normal in size with greater than 50%  respiratory variability, suggesting right atrial pressure of 3 mmHg.     11/2020 nuclear stress There was no ST segment deviation noted during stress. Findings consistent with prior inferior/inferoseptal myocardial infarction. There is a small apical infarct with mild to moderate peri-infarct ischemia. The left ventricular ejection fraction is normal (55-65%). Low to intermediate risk study.     Assessment and Plan   1. Chest pain - long history of chest pain symptoms with negative workups in the past including cath, multiple stress tests  - most recent stress test low to intermediate risk, working with medical therapy to manage symptoms -chronic infrequent mild symptoms, continue to monitor     2. Afib/acquired thrombophilia -denies symptoms, continue current meds including eliquis  for stroke prevention   3.Pacemaker -recent normal device check, no symptoms. Continue to monitor   4.  Hyperlipidemia - LDL at goal, continue current meds  5. DOE - most likely related to deconditioning, sedentary lifestyle - will check echo to evaluate for any potential cardiac dysfunction       Dorn PHEBE Ross, M.D.

## 2024-07-23 ENCOUNTER — Ambulatory Visit: Attending: Cardiology

## 2024-07-23 DIAGNOSIS — R0609 Other forms of dyspnea: Secondary | ICD-10-CM | POA: Diagnosis not present

## 2024-07-24 LAB — ECHOCARDIOGRAM COMPLETE
AR max vel: 2.03 cm2
AV Area VTI: 2.11 cm2
AV Area mean vel: 1.87 cm2
AV Mean grad: 3.3 mmHg
AV Peak grad: 6 mmHg
AV Vena cont: 0.5 cm
Ao pk vel: 1.22 m/s
Area-P 1/2: 3.23 cm2
Calc EF: 53.2 %
P 1/2 time: 452 ms
S' Lateral: 3.5 cm
Single Plane A2C EF: 53.1 %
Single Plane A4C EF: 55.4 %

## 2024-08-16 ENCOUNTER — Encounter

## 2024-08-24 ENCOUNTER — Telehealth: Payer: Self-pay | Admitting: Gastroenterology

## 2024-08-24 NOTE — Telephone Encounter (Addendum)
 Labs from 08/01/24 reviewed. B12 1886, Cre 1.76, Tbili 0.5, AP 107, AST 18, ALT 16, Hgb 14.7.  B12 level looks good.  Continue current B12 supplement.

## 2024-08-27 ENCOUNTER — Ambulatory Visit

## 2024-08-27 DIAGNOSIS — I480 Paroxysmal atrial fibrillation: Secondary | ICD-10-CM | POA: Diagnosis not present

## 2024-08-27 NOTE — Telephone Encounter (Signed)
 Pt was made aware and verbalized understanding.

## 2024-08-29 LAB — CUP PACEART REMOTE DEVICE CHECK
Battery Remaining Longevity: 77 mo
Battery Remaining Percentage: 63 %
Battery Voltage: 2.99 V
Brady Statistic AP VP Percent: 1 %
Brady Statistic AP VS Percent: 25 %
Brady Statistic AS VP Percent: 1 %
Brady Statistic AS VS Percent: 75 %
Brady Statistic RA Percent Paced: 24 %
Brady Statistic RV Percent Paced: 1 %
Date Time Interrogation Session: 20260105020024
Implantable Lead Connection Status: 753985
Implantable Lead Connection Status: 753985
Implantable Lead Implant Date: 20201103
Implantable Lead Implant Date: 20201103
Implantable Lead Location: 753859
Implantable Lead Location: 753860
Implantable Pulse Generator Implant Date: 20201103
Lead Channel Impedance Value: 400 Ohm
Lead Channel Impedance Value: 480 Ohm
Lead Channel Pacing Threshold Amplitude: 0.75 V
Lead Channel Pacing Threshold Amplitude: 0.75 V
Lead Channel Pacing Threshold Pulse Width: 0.5 ms
Lead Channel Pacing Threshold Pulse Width: 0.5 ms
Lead Channel Sensing Intrinsic Amplitude: 2.4 mV
Lead Channel Sensing Intrinsic Amplitude: 9.2 mV
Lead Channel Setting Pacing Amplitude: 2 V
Lead Channel Setting Pacing Amplitude: 2.5 V
Lead Channel Setting Pacing Pulse Width: 0.5 ms
Lead Channel Setting Sensing Sensitivity: 2 mV
Pulse Gen Model: 2272
Pulse Gen Serial Number: 9174806

## 2024-08-30 NOTE — Progress Notes (Signed)
 Remote PPM Transmission

## 2024-09-01 ENCOUNTER — Ambulatory Visit: Payer: Self-pay | Admitting: Cardiovascular Disease

## 2024-09-11 ENCOUNTER — Ambulatory Visit: Payer: Self-pay | Admitting: Cardiology

## 2024-09-26 ENCOUNTER — Other Ambulatory Visit: Payer: Self-pay | Admitting: Cardiology

## 2024-09-28 NOTE — Telephone Encounter (Signed)
 In accordance with refill protocols, please review and address the following requirements before this medication refill can be authorized:  Labs  Pt needs labs within 365 days within normal range

## 2024-10-24 ENCOUNTER — Ambulatory Visit: Admitting: Cardiology

## 2024-11-15 ENCOUNTER — Encounter

## 2024-11-26 ENCOUNTER — Encounter

## 2025-02-14 ENCOUNTER — Encounter

## 2025-02-25 ENCOUNTER — Encounter

## 2025-05-16 ENCOUNTER — Encounter
